# Patient Record
Sex: Male | Born: 1950
Health system: Southern US, Community
[De-identification: ages and names within clinical notes are randomized; demographics above are authoritative.]

## PROBLEM LIST (undated history)

## (undated) DIAGNOSIS — T7840XA Allergy, unspecified, initial encounter: Secondary | ICD-10-CM

## (undated) DIAGNOSIS — E785 Hyperlipidemia, unspecified: Secondary | ICD-10-CM

## (undated) DIAGNOSIS — I10 Essential (primary) hypertension: Secondary | ICD-10-CM

## (undated) DIAGNOSIS — Z5189 Encounter for other specified aftercare: Secondary | ICD-10-CM

## (undated) HISTORY — DX: Hyperlipidemia, unspecified: E78.5

## (undated) HISTORY — PX: HERNIA REPAIR: SHX51

## (undated) HISTORY — DX: Allergy, unspecified, initial encounter: T78.40XA

## (undated) HISTORY — PX: TONSILLECTOMY: SUR1361

## (undated) HISTORY — PX: COLONOSCOPY: SHX174

## (undated) HISTORY — DX: Essential (primary) hypertension: I10

## (undated) HISTORY — DX: Encounter for other specified aftercare: Z51.89

## (undated) HISTORY — PX: POLYPECTOMY: SHX149

---

## 2005-01-25 ENCOUNTER — Ambulatory Visit: Payer: Self-pay | Admitting: Family Medicine

## 2005-06-28 ENCOUNTER — Ambulatory Visit: Payer: Self-pay | Admitting: Internal Medicine

## 2006-07-02 ENCOUNTER — Ambulatory Visit: Payer: Self-pay | Admitting: Internal Medicine

## 2007-01-18 ENCOUNTER — Ambulatory Visit: Payer: Self-pay | Admitting: Internal Medicine

## 2007-01-18 LAB — CONVERTED CEMR LAB
ALT: 35 units/L (ref 0–40)
AST: 31 units/L (ref 0–37)
Albumin: 4.1 g/dL (ref 3.5–5.2)
Alkaline Phosphatase: 70 units/L (ref 39–117)
Bilirubin, Direct: 0.1 mg/dL (ref 0.0–0.3)
Cholesterol: 149 mg/dL (ref 0–200)
HDL: 35.9 mg/dL — ABNORMAL LOW (ref >39.0)
LDL Cholesterol: 99 mg/dL (ref 0–99)
Total Bilirubin: 1.2 mg/dL (ref 0.3–1.2)
Total CHOL/HDL Ratio: 4.2
Total Protein: 7.2 g/dL (ref 6.0–8.3)
Triglycerides: 73 mg/dL (ref 0–149)
VLDL: 15 mg/dL (ref 0–40)

## 2007-08-15 ENCOUNTER — Ambulatory Visit: Payer: Self-pay | Admitting: Internal Medicine

## 2007-08-15 DIAGNOSIS — D126 Benign neoplasm of colon, unspecified: Secondary | ICD-10-CM | POA: Insufficient documentation

## 2007-08-15 DIAGNOSIS — E785 Hyperlipidemia, unspecified: Secondary | ICD-10-CM

## 2007-08-15 DIAGNOSIS — E782 Mixed hyperlipidemia: Secondary | ICD-10-CM | POA: Insufficient documentation

## 2008-01-27 ENCOUNTER — Ambulatory Visit: Payer: Self-pay | Admitting: Internal Medicine

## 2008-02-12 ENCOUNTER — Encounter: Payer: Self-pay | Admitting: Internal Medicine

## 2008-02-12 ENCOUNTER — Ambulatory Visit: Payer: Self-pay | Admitting: Internal Medicine

## 2008-09-10 ENCOUNTER — Ambulatory Visit: Payer: Self-pay | Admitting: Internal Medicine

## 2008-09-14 ENCOUNTER — Encounter: Payer: Self-pay | Admitting: Internal Medicine

## 2008-09-25 ENCOUNTER — Telehealth (INDEPENDENT_AMBULATORY_CARE_PROVIDER_SITE_OTHER): Payer: Self-pay | Admitting: *Deleted

## 2008-12-29 ENCOUNTER — Telehealth (INDEPENDENT_AMBULATORY_CARE_PROVIDER_SITE_OTHER): Payer: Self-pay | Admitting: *Deleted

## 2009-01-21 ENCOUNTER — Ambulatory Visit: Payer: Self-pay | Admitting: Internal Medicine

## 2009-01-25 ENCOUNTER — Encounter (INDEPENDENT_AMBULATORY_CARE_PROVIDER_SITE_OTHER): Payer: Self-pay | Admitting: *Deleted

## 2009-01-25 LAB — CONVERTED CEMR LAB
ALT: 29 U/L
AST: 27 U/L
Cholesterol: 144 mg/dL
HDL: 33.7 mg/dL — ABNORMAL LOW
LDL Cholesterol: 98 mg/dL
Total CHOL/HDL Ratio: 4.3
Triglycerides: 60 mg/dL
VLDL: 12 mg/dL

## 2009-04-21 ENCOUNTER — Ambulatory Visit: Payer: Self-pay | Admitting: Internal Medicine

## 2009-04-26 LAB — CONVERTED CEMR LAB
ALT: 24 units/L (ref 0–53)
AST: 23 units/L (ref 0–37)
Cholesterol: 149 mg/dL (ref 0–200)
HDL: 31 mg/dL — ABNORMAL LOW (ref >39.00)
LDL Cholesterol: 94 mg/dL (ref 0–99)
Total CHOL/HDL Ratio: 5
Triglycerides: 118 mg/dL (ref 0.0–149.0)
VLDL: 23.6 mg/dL (ref 0.0–40.0)

## 2009-09-24 ENCOUNTER — Ambulatory Visit: Payer: Self-pay | Admitting: Internal Medicine

## 2010-02-22 ENCOUNTER — Telehealth (INDEPENDENT_AMBULATORY_CARE_PROVIDER_SITE_OTHER): Payer: Self-pay | Admitting: *Deleted

## 2010-04-25 ENCOUNTER — Telehealth (INDEPENDENT_AMBULATORY_CARE_PROVIDER_SITE_OTHER): Payer: Self-pay | Admitting: *Deleted

## 2010-04-25 DIAGNOSIS — R972 Elevated prostate specific antigen [PSA]: Secondary | ICD-10-CM

## 2010-05-04 ENCOUNTER — Ambulatory Visit: Payer: Self-pay | Admitting: Internal Medicine

## 2010-05-06 LAB — CONVERTED CEMR LAB: PSA: 0.86 ng/mL (ref 0.10–4.00)

## 2010-10-05 ENCOUNTER — Ambulatory Visit: Payer: Self-pay | Admitting: Internal Medicine

## 2010-10-05 DIAGNOSIS — L989 Disorder of the skin and subcutaneous tissue, unspecified: Secondary | ICD-10-CM | POA: Insufficient documentation

## 2010-10-07 LAB — CONVERTED CEMR LAB
ALT: 34 units/L (ref 0–53)
AST: 34 units/L (ref 0–37)
BUN: 15 mg/dL (ref 6–23)
Basophils Absolute: 0 10*3/uL (ref 0.0–0.1)
Basophils Relative: 0.6 % (ref 0.0–3.0)
CO2: 29 meq/L (ref 19–32)
Calcium: 9.5 mg/dL (ref 8.4–10.5)
Chloride: 105 meq/L (ref 96–112)
Cholesterol: 173 mg/dL (ref 0–200)
Creatinine, Ser: 1 mg/dL (ref 0.4–1.5)
Eosinophils Absolute: 0.3 10*3/uL (ref 0.0–0.7)
Eosinophils Relative: 6.5 % — ABNORMAL HIGH (ref 0.0–5.0)
GFR calc non Af Amer: 78.08 mL/min (ref >60)
Glucose, Bld: 99 mg/dL (ref 70–99)
HCT: 43.5 % (ref 39.0–52.0)
HDL: 39.2 mg/dL (ref >39.00)
Hemoglobin: 15.1 g/dL (ref 13.0–17.0)
LDL Cholesterol: 114 mg/dL — ABNORMAL HIGH (ref 0–99)
Lymphocytes Relative: 28.5 % (ref 12.0–46.0)
Lymphs Abs: 1.4 10*3/uL (ref 0.7–4.0)
MCHC: 34.8 g/dL (ref 30.0–36.0)
MCV: 95.5 fL (ref 78.0–100.0)
Monocytes Absolute: 0.6 10*3/uL (ref 0.1–1.0)
Monocytes Relative: 12.8 % — ABNORMAL HIGH (ref 3.0–12.0)
Neutro Abs: 2.5 10*3/uL (ref 1.4–7.7)
Neutrophils Relative %: 51.6 % (ref 43.0–77.0)
PSA: 0.94 ng/mL (ref 0.10–4.00)
Platelets: 226 10*3/uL (ref 150.0–400.0)
Potassium: 4.5 meq/L (ref 3.5–5.1)
RBC: 4.55 M/uL (ref 4.22–5.81)
RDW: 12.5 % (ref 11.5–14.6)
Sodium: 141 meq/L (ref 135–145)
TSH: 1.38 microintl units/mL (ref 0.35–5.50)
Total CHOL/HDL Ratio: 4
Triglycerides: 100 mg/dL (ref 0.0–149.0)
VLDL: 20 mg/dL (ref 0.0–40.0)
WBC: 4.8 10*3/uL (ref 4.5–10.5)

## 2011-01-11 ENCOUNTER — Other Ambulatory Visit: Payer: Self-pay | Admitting: Internal Medicine

## 2011-01-11 ENCOUNTER — Ambulatory Visit
Admission: RE | Admit: 2011-01-11 | Discharge: 2011-01-11 | Payer: Self-pay | Source: Home / Self Care | Attending: Internal Medicine | Admitting: Internal Medicine

## 2011-01-11 LAB — LIPID PANEL
Cholesterol: 161 mg/dL (ref 0–200)
HDL: 36.3 mg/dL — ABNORMAL LOW (ref >39.00)
LDL Cholesterol: 108 mg/dL — ABNORMAL HIGH (ref 0–99)
Total CHOL/HDL Ratio: 4
Triglycerides: 86 mg/dL (ref 0.0–149.0)
VLDL: 17.2 mg/dL (ref 0.0–40.0)

## 2011-01-22 LAB — CONVERTED CEMR LAB
ALT: 22 units/L (ref 0–53)
ALT: 24 units/L (ref 0–53)
ALT: 27 units/L (ref 0–53)
AST: 25 units/L (ref 0–37)
AST: 26 units/L (ref 0–37)
AST: 29 units/L (ref 0–37)
Albumin: 4.5 g/dL (ref 3.5–5.2)
Alkaline Phosphatase: 68 units/L (ref 39–117)
BUN: 12 mg/dL (ref 6–23)
BUN: 13 mg/dL (ref 6–23)
BUN: 14 mg/dL (ref 6–23)
Basophils Absolute: 0 10*3/uL (ref 0.0–0.1)
Basophils Absolute: 0 10*3/uL (ref 0.0–0.1)
Basophils Relative: 0.4 % (ref 0.0–3.0)
Basophils Relative: 0.6 % (ref 0.0–3.0)
Bilirubin, Direct: 0.2 mg/dL (ref 0.0–0.3)
CO2: 30 meq/L (ref 19–32)
CO2: 31 meq/L (ref 19–32)
CO2: 32 meq/L (ref 19–32)
Calcium: 9.3 mg/dL (ref 8.4–10.5)
Calcium: 9.4 mg/dL (ref 8.4–10.5)
Calcium: 9.7 mg/dL (ref 8.4–10.5)
Chloride: 102 meq/L (ref 96–112)
Chloride: 104 meq/L (ref 96–112)
Chloride: 107 meq/L (ref 96–112)
Cholesterol: 141 mg/dL (ref 0–200)
Cholesterol: 179 mg/dL (ref 0–200)
Creatinine, Ser: 1 mg/dL (ref 0.4–1.5)
Creatinine, Ser: 1.1 mg/dL (ref 0.4–1.5)
Creatinine, Ser: 1.1 mg/dL (ref 0.4–1.5)
Eosinophils Absolute: 0.3 10*3/uL (ref 0.0–0.7)
Eosinophils Absolute: 0.4 10*3/uL (ref 0.0–0.7)
Eosinophils Relative: 6.1 % — ABNORMAL HIGH (ref 0.0–5.0)
Eosinophils Relative: 7.8 % — ABNORMAL HIGH (ref 0.0–5.0)
GFR calc Af Amer: 89 mL/min
GFR calc Af Amer: 90 mL/min
GFR calc non Af Amer: 74 mL/min
GFR calc non Af Amer: 74 mL/min
GFR calc non Af Amer: 82 mL/min (ref >60)
Glucose, Bld: 100 mg/dL — ABNORMAL HIGH (ref 70–99)
Glucose, Bld: 88 mg/dL (ref 70–99)
Glucose, Bld: 93 mg/dL (ref 70–99)
HCT: 43 % (ref 39.0–52.0)
HCT: 44.9 % (ref 39.0–52.0)
HDL: 33.5 mg/dL — ABNORMAL LOW (ref >39.00)
HDL: 36 mg/dL — ABNORMAL LOW (ref >39.0)
Hemoglobin: 14.9 g/dL (ref 13.0–17.0)
Hemoglobin: 15.2 g/dL (ref 13.0–17.0)
Hemoglobin: 15.7 g/dL (ref 13.0–17.0)
LDL Cholesterol: 128 mg/dL — ABNORMAL HIGH (ref 0–99)
LDL Cholesterol: 94 mg/dL (ref 0–99)
Lymphocytes Relative: 29.4 % (ref 12.0–46.0)
Lymphocytes Relative: 32.7 % (ref 12.0–46.0)
Lymphs Abs: 1.4 10*3/uL (ref 0.7–4.0)
MCHC: 34.5 g/dL (ref 30.0–36.0)
MCHC: 34.9 g/dL (ref 30.0–36.0)
MCV: 93.2 fL (ref 78.0–100.0)
MCV: 96.4 fL (ref 78.0–100.0)
Monocytes Absolute: 0.6 10*3/uL (ref 0.1–1.0)
Monocytes Absolute: 0.7 10*3/uL (ref 0.1–1.0)
Monocytes Relative: 13.9 % — ABNORMAL HIGH (ref 3.0–12.0)
Monocytes Relative: 14.7 % — ABNORMAL HIGH (ref 3.0–12.0)
Neutro Abs: 1.9 10*3/uL (ref 1.4–7.7)
Neutro Abs: 2.3 10*3/uL (ref 1.4–7.7)
Neutrophils Relative %: 46.1 % (ref 43.0–77.0)
Neutrophils Relative %: 48.3 % (ref 43.0–77.0)
PSA: 0.67 ng/mL (ref 0.10–4.00)
PSA: 0.83 ng/mL (ref 0.10–4.00)
PSA: 2.1 ng/mL (ref 0.10–4.00)
Platelets: 247 10*3/uL (ref 150–400)
Platelets: 263 10*3/uL (ref 150.0–400.0)
Potassium: 3.9 meq/L (ref 3.5–5.1)
Potassium: 4.4 meq/L (ref 3.5–5.1)
Potassium: 4.7 meq/L (ref 3.5–5.1)
RBC: 4.62 M/uL (ref 4.22–5.81)
RBC: 4.66 M/uL (ref 4.22–5.81)
RDW: 11.3 % — ABNORMAL LOW (ref 11.5–14.6)
RDW: 11.5 % (ref 11.5–14.6)
Sodium: 138 meq/L (ref 135–145)
Sodium: 140 meq/L (ref 135–145)
Sodium: 141 meq/L (ref 135–145)
TSH: 1.65 microintl units/mL (ref 0.35–5.50)
Total Bilirubin: 1 mg/dL (ref 0.3–1.2)
Total CHOL/HDL Ratio: 4
Total CHOL/HDL Ratio: 5
Total Protein: 7.4 g/dL (ref 6.0–8.3)
Triglycerides: 69 mg/dL (ref 0.0–149.0)
Triglycerides: 77 mg/dL (ref 0–149)
VLDL: 13.8 mg/dL (ref 0.0–40.0)
VLDL: 15 mg/dL (ref 0–40)
WBC: 4.2 10*3/uL — ABNORMAL LOW (ref 4.5–10.5)
WBC: 4.8 10*3/uL (ref 4.5–10.5)

## 2011-01-24 NOTE — Assessment & Plan Note (Signed)
Summary: CPX--PH   Vital Signs:  Patient profile:   60 year old male Height:      75 inches Weight:      203.50 pounds BMI:     25.53 Pulse rate:   62 / minute Pulse rhythm:   regular BP sitting:   122 / 82  (left arm) Cuff size:   large  Vitals Entered By: Army Fossa CMA (October 05, 2010 8:56 AM) CC: CPX, fasting Comments declines flu shot  [facility]    History of Present Illness: CPX   Preventive Screening-Counseling & Management  Caffeine-Diet-Exercise     Times/week: 3  Current Medications (verified): 1)  Bayer Low Strength 81 Mg  Tbec (Aspirin) 2)  Zocor 40 Mg Tabs (Simvastatin) .Marland Kitchen.. 1 By Mouth At Bedtime  Allergies (verified): No Known Drug Allergies  Past History:  Past Medical History: Reviewed history from 08/15/2007 and no changes required. Hyperlipidemia  Past Surgical History: Reviewed history from 09/10/2008 and no changes required. Denies surgical history  Family History: Reviewed history from 09/24/2009 and no changes required. father had a heart attack at age 41. 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 CV:  Denies chest pain or discomfort and swelling of feet. Resp:  Denies cough, shortness of breath, and wheezing. GI:  Denies bloody stools, constipation, diarrhea, and nausea. GU:  Denies dysuria, hematuria, nocturia, and urinary frequency. Psych:  Denies anxiety and depression.  Physical Exam  General:  alert, well-developed, and well-nourished.   Neck:  no masses, no thyromegaly, and normal carotid upstroke.   Lungs:  normal respiratory effort, no intercostal retractions, no accessory muscle use, and  normal breath sounds.   Heart:  normal rate, regular rhythm, and no murmur.   Abdomen:  soft, non-tender, normal bowel sounds, no distention, no masses, no guarding, and no rigidity.  no bruit  Rectal:  small external hemorrhoids, Normal sphincter tone. No rectal masses or tenderness. Prostate:  Prostate gland firm and smooth, no enlargement, nodularity, tenderness, mass, asymmetry or induration. Extremities:  no pretibial edema bilaterally  Psych:  Cognition and judgment appear intact. Alert and cooperative with normal attention span and concentration.  not anxious appearing and not depressed appearing.     Impression & Recommendations:  Problem # 1:  HEALTH MAINTENANCE EXAM (ICD-V70.0) tetanus shot 2003 flu shot, declined , explained benefits   had a colonoscopy in July 2004 -- biopsy showed tubular adenomas and hyperplastic polyps. colonoscopy 01/2008 colon two polyps, tics, hemorrhoids.  Dr. Marina Goodell.  Next,  in 5 years   doing great, diet info provided   Orders: Venipuncture ([address]) TLB-BMP (Basic Metabolic Panel-BMET) (80048-METABOL) TLB-CBC Platelet - w/Differential (85025-CBCD) TLB-Lipid Panel (80061-LIPID) TLB-TSH (Thyroid Stimulating Hormone) (84443-TSH) TLB-PSA (Prostate Specific Antigen) (84153-PSA) TLB-ALT (SGPT) (84460-ALT) TLB-AST (SGOT) (84450-SGOT) Specimen Handling (60454)  Problem # 2:  HYPERLIPIDEMIA (ICD-272.4) no change  His updated medication list for this problem includes:    Zocor 40 Mg Tabs (Simvastatin) .Marland Kitchen... 1 by mouth at bedtime  Labs Reviewed: SGOT: 25 (09/24/2009)   SGPT: 27 (09/24/2009)   HDL:33.50 (09/24/2009), 31.00 (04/21/2009)  LDL:94 (09/24/2009), 94 (04/21/2009)  Chol:141 (09/24/2009), 149 (04/21/2009)  Trig:69.0 (09/24/2009), 118.0 (04/21/2009)  Problem # 3:  SKIN LESIONS, MULTIPLE (  ICD-709.9)  patient's wife has noted  skin lesions in the scalp Exam is confirmatory, most of them are papular slightly hyperchromic. lesions are likely  benign but patient is concerned. plan: dermatology referral  Orders: Dermatology Referral (Derma)  Complete Medication List: 1)  Bayer Low Strength 81 Mg Tbec (Aspirin) 2)  Zocor 40 Mg Tabs (Simvastatin) .Marland Kitchen.. 1 by mouth at bedtime  Patient Instructions: 1)  Please schedule a follow-up appointment in 1 year, fastin, physical exam    Risk Factors:  Exercise:  yes    Times per week:  3

## 2011-01-24 NOTE — Progress Notes (Signed)
Summary: refill request  Phone Note Refill Request Message from:  Fax from Pharmacy on February 22, 2010 3:19 PM  Refills Requested: Medication #1:  ZOCOR 40 MG TABS 1 by mouth at bedtime.   Brand Name Necessary? No   Supply Requested: 3 months refill authorization to [facility] on [address]  Initial call taken by: Michaelle Copas,  February 22, 2010 3:19 PM    Prescriptions: ZOCOR 40 MG TABS (SIMVASTATIN) 1 by mouth at bedtime  #90 Tablet x 0   Entered by:   Kandice Hams   Authorized by:   Nolon Rod. Paz MD   Signed by:   Kandice Hams on 02/22/2010   Method used:   Faxed to ...       [facility] [id number]* (retail)       [address]       [place], [place]  [address]       Ph: [phone]       Fax: [phone]   RxID:   [id number]

## 2011-01-24 NOTE — Progress Notes (Signed)
Summary: due lab   Phone Note Outgoing Call Call back at Home Phone [phone] Call back at Work Phone [phone]   Summary of Call: Patient is due for his repeat PSA dx 790.93 Shary Decamp  Apr 25, 2010 9:03 AM   Follow-up for Phone Call        [email or web address] Barban  Apr 25, 2010 10:06 AM  Additional Follow-up for Phone Call Additional follow up Details #1::        Patient is coming in on 5.11.11 Additional Follow-up by: Harold Barban,  Apr 27, 2010 1:25 PM  New Problems: PSA, INCREASED (ICD-790.93)   New Problems: PSA, INCREASED (ICD-790.93)

## 2011-05-28 ENCOUNTER — Other Ambulatory Visit: Payer: Self-pay | Admitting: Internal Medicine

## 2012-02-23 ENCOUNTER — Other Ambulatory Visit: Payer: Self-pay | Admitting: Internal Medicine

## 2012-02-23 NOTE — Telephone Encounter (Signed)
Prescription sent to pharmacy.  Needs appointment

## 2012-02-27 ENCOUNTER — Other Ambulatory Visit: Payer: Self-pay | Admitting: Internal Medicine

## 2012-06-03 ENCOUNTER — Other Ambulatory Visit: Payer: Self-pay | Admitting: Internal Medicine

## 2012-06-04 MED ORDER — SIMVASTATIN 40 MG PO TABS: 40.0000 mg | ORAL_TABLET | Freq: Every day | ORAL | Status: DC

## 2012-06-04 NOTE — Telephone Encounter (Signed)
Refilled meds

## 2012-06-04 NOTE — Telephone Encounter (Signed)
Pt has CPE scheduled for 07/05/12 and would like his meds refilled.

## 2012-06-04 NOTE — Telephone Encounter (Signed)
Addended by: Edwena Felty T on: 06/04/2012 01:19 PM   Modules accepted: Orders

## 2012-06-04 NOTE — Telephone Encounter (Signed)
Pt must have an appt for future refills.

## 2012-07-05 ENCOUNTER — Ambulatory Visit (INDEPENDENT_AMBULATORY_CARE_PROVIDER_SITE_OTHER): Payer: BC Managed Care – PPO | Admitting: Internal Medicine

## 2012-07-05 ENCOUNTER — Encounter: Payer: Self-pay | Admitting: Internal Medicine

## 2012-07-05 VITALS — BP 140/88 | HR 49 | Temp 97.4°F | Ht 74.5 in | Wt 204.0 lb

## 2012-07-05 DIAGNOSIS — Z0000 Encounter for general adult medical examination without abnormal findings: Secondary | ICD-10-CM | POA: Insufficient documentation

## 2012-07-05 DIAGNOSIS — E785 Hyperlipidemia, unspecified: Secondary | ICD-10-CM

## 2012-07-05 DIAGNOSIS — L989 Disorder of the skin and subcutaneous tissue, unspecified: Secondary | ICD-10-CM

## 2012-07-05 LAB — CBC WITH DIFFERENTIAL/PLATELET
Basophils Absolute: 0 10*3/uL (ref 0.0–0.1)
HCT: 43.3 % (ref 39.0–52.0)
Hemoglobin: 14.6 g/dL (ref 13.0–17.0)
Lymphs Abs: 1.5 10*3/uL (ref 0.7–4.0)
MCHC: 33.8 g/dL (ref 30.0–36.0)
MCV: 96 fl (ref 78.0–100.0)
Monocytes Absolute: 0.6 10*3/uL (ref 0.1–1.0)
Monocytes Relative: 13.6 % — ABNORMAL HIGH (ref 3.0–12.0)
Neutro Abs: 2.1 10*3/uL (ref 1.4–7.7)
Platelets: 231 10*3/uL (ref 150.0–400.0)
RDW: 12.4 % (ref 11.5–14.6)

## 2012-07-05 LAB — COMPREHENSIVE METABOLIC PANEL
ALT: 23 U/L (ref 0–53)
AST: 25 U/L (ref 0–37)
Alkaline Phosphatase: 66 U/L (ref 39–117)
CO2: 26 mEq/L (ref 19–32)
Creatinine, Ser: 1 mg/dL (ref 0.4–1.5)
Sodium: 139 mEq/L (ref 135–145)
Total Bilirubin: 0.9 mg/dL (ref 0.3–1.2)
Total Protein: 7.4 g/dL (ref 6.0–8.3)

## 2012-07-05 LAB — LIPID PANEL
Cholesterol: 162 mg/dL (ref 0–200)
VLDL: 15.8 mg/dL (ref 0.0–40.0)

## 2012-07-05 LAB — TSH: TSH: 1.16 u[IU]/mL (ref 0.35–5.50)

## 2012-07-05 LAB — PSA: PSA: 1.18 ng/mL (ref 0.10–4.00)

## 2012-07-05 NOTE — Assessment & Plan Note (Signed)
Tdap today  had a colonoscopy in July 2004 -- biopsy showed tubular adenomas and hyperplastic polyps. colonoscopy 01/2008 colon two polyps, tics, hemorrhoids.  Dr. Marina Goodell.  Next,  in 5 years  Diet and exercise discussed, labs

## 2012-07-05 NOTE — Progress Notes (Signed)
  Subjective:    Patient ID: Christopher Santos, male    DOB: 14-Oct-1951, 61 y.o.   MRN: [id number]  HPI CXR  Past Medical History: Hyperlipidemia  Past Surgical History: Denies surgical history  Family History: father had a heart attack at age 68. 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 No anxiety- depression. No dysuria gross hematuria. Good compliance with   cholesterol medication.    Objective:   Physical Exam General -- alert, well-developed, and well-nourished.   Neck --no thyromegaly , normal carotid pulse Lungs -- normal respiratory effort, no intercostal retractions, no accessory muscle use, and normal breath sounds.   Heart-- normal rate, regular rhythm, no murmur, and no gallop.   Abdomen--soft, non-tender, no distention, no masses, no HSM, no guarding, and no rigidity.   Extremities-- no pretibial edema bilaterally Rectal-- No external abnormalities noted. Normal sphincter tone. No rectal masses or tenderness. Brown stool  Prostate:  Prostate gland firm and smooth, no enlargement, nodularity, tenderness, mass, asymmetry or induration. Neurologic-- alert & oriented X3 and strength normal in all extremities. Psych-- Cognition and judgment appear intact. Alert and cooperative with normal attention span and concentration.  not anxious appearing and not depressed appearing.      Assessment & Plan:

## 2012-07-05 NOTE — Patient Instructions (Signed)
Your BP today is 140/88, slightly elevated. Check the  blood pressure 2 or 3 times a week, be sure it is between 110/60 and 140/85. If it is consistently higher or lower, let me know

## 2012-07-05 NOTE — Assessment & Plan Note (Signed)
Was evaluated by dermatology, told lesions were okay, lesions still there, the patient's wife is concerned. Recommend a re- referral to dermatology if needed.

## 2012-07-05 NOTE — Assessment & Plan Note (Signed)
Good compliance with meds, labs 

## 2012-09-02 ENCOUNTER — Other Ambulatory Visit: Payer: Self-pay | Admitting: Internal Medicine

## 2012-09-03 NOTE — Telephone Encounter (Signed)
Refill done.  

## 2013-02-08 ENCOUNTER — Other Ambulatory Visit: Payer: Self-pay

## 2013-03-12 ENCOUNTER — Encounter: Payer: Self-pay | Admitting: Internal Medicine

## 2013-05-26 ENCOUNTER — Other Ambulatory Visit: Payer: Self-pay | Admitting: Internal Medicine

## 2013-05-27 ENCOUNTER — Encounter: Payer: Self-pay | Admitting: *Deleted

## 2013-05-27 NOTE — Telephone Encounter (Signed)
Refill done.  Letter mailed to pt to make aware he is due for an OV.  

## 2013-07-24 ENCOUNTER — Encounter: Payer: Self-pay | Admitting: Internal Medicine

## 2013-07-24 ENCOUNTER — Ambulatory Visit (INDEPENDENT_AMBULATORY_CARE_PROVIDER_SITE_OTHER): Payer: BC Managed Care – PPO | Admitting: Internal Medicine

## 2013-07-24 VITALS — BP 130/70 | HR 56 | Temp 97.5°F | Ht 74.75 in | Wt 207.0 lb

## 2013-07-24 DIAGNOSIS — Z23 Encounter for immunization: Secondary | ICD-10-CM

## 2013-07-24 DIAGNOSIS — Z0000 Encounter for general adult medical examination without abnormal findings: Secondary | ICD-10-CM

## 2013-07-24 DIAGNOSIS — E785 Hyperlipidemia, unspecified: Secondary | ICD-10-CM

## 2013-07-24 DIAGNOSIS — Z2911 Encounter for prophylactic immunotherapy for respiratory syncytial virus (RSV): Secondary | ICD-10-CM

## 2013-07-24 LAB — COMPREHENSIVE METABOLIC PANEL
AST: 25 U/L (ref 0–37)
Alkaline Phosphatase: 67 U/L (ref 39–117)
BUN: 11 mg/dL (ref 6–23)
Calcium: 9.4 mg/dL (ref 8.4–10.5)
Creatinine, Ser: 1 mg/dL (ref 0.4–1.5)
Total Bilirubin: 0.7 mg/dL (ref 0.3–1.2)

## 2013-07-24 LAB — LIPID PANEL
HDL: 35 mg/dL — ABNORMAL LOW (ref >39.00)
Total CHOL/HDL Ratio: 4
Triglycerides: 59 mg/dL (ref 0.0–149.0)
VLDL: 11.8 mg/dL (ref 0.0–40.0)

## 2013-07-24 NOTE — Progress Notes (Signed)
  Subjective:    Patient ID: Christopher Santos, male    DOB: 07/03/1951, 62 y.o.   MRN: [id number]  HPI CPX   Past Medical History  Diagnosis Date  . Hyperlipemia    Past Surgical History  Procedure Laterality Date  . No past surgeries     History   Social History  . Marital Status: Married    Spouse Name: N/A    Number of Children: 0  . Years of Education: N/A   Occupational History  .  [employer]   Social History Main Topics  . Smoking status: Former Games developer  . Smokeless tobacco: Never Used     Comment: quit 1993, no heavy use   . Alcohol Use: Yes     Comment: socially   . Drug Use: No  . Sexually Active: Not on file   Other Topics Concern  . 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 CAD Father     MI at 76  . Diabetes Neg Hx   . Colon cancer Neg Hx   . Prostate cancer Neg Hx     Review of Systems Diet--healthy-regular Exercise--swimming 3 times a week Denies chest pain, shortness or breath No nausea, vomiting, diarrhea or blood in the stools. No dysuria or gross hematuria. Some stress at work but not actual anxiety or depression.     Objective:   Physical Exam  BP 130/70  Pulse 56  Temp(Src) 97.5 F (36.4 C) (Oral)  Ht 6' 2.75" (1.899 m)  Wt 207 lb (93.895 kg)  BMI 26.04 kg/m2  SpO2 97% General -- alert, well-developed, NAD .   Neck --no thyromegaly , normal carotid pulse Lungs -- normal respiratory effort, no intercostal retractions, no accessory muscle use, and normal breath sounds.   Heart-- normal rate, regular rhythm, no murmur, and no gallop.   Abdomen--soft, non-tender, no distention, no masses, no HSM, no guarding, and no rigidity.   Extremities-- no pretibial edema bilaterally Rectal-- No external abnormalities noted. Normal sphincter tone. No rectal masses or tenderness. no stool found Prostate:  Prostate  gland firm and smooth, no enlargement, nodularity, tenderness, mass, asymmetry or induration. Neurologic-- alert & oriented X3 and strength normal in all extremities. Psych-- Cognition and judgment appear intact. Alert and cooperative with normal attention span and concentration.  not anxious appearing and not depressed appearing.         Assessment & Plan:

## 2013-07-24 NOTE — Assessment & Plan Note (Signed)
+   FH CAD, last LDL ~ 100 which is great. Cont meds ,ASA, labs    

## 2013-07-24 NOTE — Assessment & Plan Note (Signed)
Tdap 2013 zostavax -- pros-cons-benefits discussed, shot provided today had a colonoscopy in July 2004 -- biopsy showed tubular adenomas and hyperplastic polyps. colonoscopy 01/2008 colon two polyps, tics, hemorrhoids.  Dr. Marina Goodell.  Next,  in 5 years. Letter from GI reprinted, encouraged to call  Diet and exercise discussed, labs

## 2013-07-29 ENCOUNTER — Encounter: Payer: Self-pay | Admitting: Internal Medicine

## 2013-07-29 ENCOUNTER — Other Ambulatory Visit: Payer: Self-pay | Admitting: Internal Medicine

## 2013-07-30 NOTE — Telephone Encounter (Signed)
Refill done per protocol.  

## 2013-09-25 ENCOUNTER — Ambulatory Visit (AMBULATORY_SURGERY_CENTER): Payer: Self-pay | Admitting: *Deleted

## 2013-09-25 VITALS — Ht 75.0 in | Wt 211.4 lb

## 2013-09-25 DIAGNOSIS — Z86010 Personal history of colonic polyps: Secondary | ICD-10-CM

## 2013-09-25 MED ORDER — MOVIPREP 100 G PO SOLR: ORAL | Status: DC

## 2013-09-25 NOTE — Progress Notes (Signed)
No allergies to eggs or soy. No previous anesthesia.

## 2013-09-26 ENCOUNTER — Encounter: Payer: Self-pay | Admitting: Internal Medicine

## 2013-10-09 ENCOUNTER — Encounter: Payer: Self-pay | Admitting: Internal Medicine

## 2013-10-09 ENCOUNTER — Ambulatory Visit (AMBULATORY_SURGERY_CENTER): Payer: BC Managed Care – PPO | Admitting: Internal Medicine

## 2013-10-09 VITALS — BP 130/55 | HR 61 | Temp 98.7°F | Resp 20 | Ht 75.0 in | Wt 211.0 lb

## 2013-10-09 DIAGNOSIS — D126 Benign neoplasm of colon, unspecified: Secondary | ICD-10-CM

## 2013-10-09 DIAGNOSIS — Z86010 Personal history of colonic polyps: Secondary | ICD-10-CM

## 2013-10-09 MED ORDER — SODIUM CHLORIDE 0.9 % IV SOLN: 500.0000 mL | INTRAVENOUS | Status: DC

## 2013-10-09 NOTE — Progress Notes (Signed)
Called to room to assist during endoscopic procedure.  Patient ID and intended procedure confirmed with present staff. Received instructions for my participation in the procedure from the performing physician.  

## 2013-10-09 NOTE — Progress Notes (Signed)
Lidocaine-40mg IV prior to Propofol InductionPropofol given over incremental dosages 

## 2013-10-09 NOTE — Op Note (Signed)
[facility] [address] [place], [address]   COLONOSCOPY PROCEDURE REPORT  PATIENT: Christopher Santos, Christopher Santos  MR#: [id number] BIRTHDATE: [birth year] , 62  yrs. old GENDER: Male ENDOSCOPIST: Roxy Cedar, MD REFERRED XB:[id number] Program Recall PROCEDURE DATE:  10/09/2013 PROCEDURE:   Colonoscopy with snare polypectomy x 1 First Screening Colonoscopy - Avg.  risk and is 50 yrs.  old or older - No.  Prior Negative Screening - Now for repeat screening. N/A  History of Adenoma - Now for follow-up colonoscopy & has been > or = to 3 yrs.  Yes hx of adenoma.  Has been 3 or more years since last colonoscopy.  Polyps Removed Today? Yes. ASA CLASS:   Class II INDICATIONS:Patient's personal history of adenomatous colon polyps. Index 2004 (multiple adenomas); 2009 (TAs) MEDICATIONS: MAC sedation, administered by CRNA and propofol (Diprivan) 250mg  IV  DESCRIPTION OF PROCEDURE:   After the risks benefits and alternatives of the procedure were thoroughly explained, informed consent was obtained.  A digital rectal exam revealed no abnormalities of the rectum.   The LB [id number]  endoscope was introduced through the anus and advanced to the cecum, which was identified by both the appendix and ileocecal valve. No adverse events experienced.   The quality of the prep was excellent, using MoviPrep  The instrument was then slowly withdrawn as the colon was fully examined.  COLON FINDINGS: A sessile polyp measuring 7 mm in size was found in the ascending colon.  A polypectomy was performed with a cold snare.  The resection was complete and the polyp tissue was completely retrieved.   Mild diverticulosis was noted in the sigmoid colon.   The colon mucosa was otherwise normal. Retroflexed views revealed internal hemorrhoids. The time to cecum=2 minutes 40 seconds.  Withdrawal time=14 minutes 40 seconds. The scope was withdrawn and the procedure completed. COMPLICATIONS:  There were no complications.  ENDOSCOPIC IMPRESSION: 1.   Sessile polyp measuring 7 mm in size was found in the ascending colon; polypectomy was performed with a cold snare 2.   Mild diverticulosis was noted in the sigmoid colon 3.   The colon mucosa was otherwise normal  RECOMMENDATIONS: 1. Follow up colonoscopy in 5 years   eSigned:  Roxy Cedar, MD 10/09/2013 10:43 AM   cc: Willow Ora, MD and The Patient

## 2013-10-09 NOTE — Progress Notes (Signed)
Patient did not experience any of the following events: a burn prior to discharge; a fall within the facility; wrong site/side/patient/procedure/implant event; or a hospital transfer or hospital admission upon discharge from the facility. (G8907) Patient did not have preoperative order for IV antibiotic SSI prophylaxis. (G8918)  

## 2013-10-09 NOTE — Patient Instructions (Signed)
YOU HAD AN ENDOSCOPIC PROCEDURE TODAY AT THE [facility]: Refer to the procedure report that was given to you for any specific questions about what was found during the examination.  If the procedure report does not answer your questions, please call your gastroenterologist to clarify.  If you requested that your care partner not be given the details of your procedure findings, then the procedure report has been included in a sealed envelope for you to review at your convenience later.  YOU SHOULD EXPECT: Some feelings of bloating in the abdomen. Passage of more gas than usual.  Walking can help get rid of the air that was put into your GI tract during the procedure and reduce the bloating. If you had a lower endoscopy (such as a colonoscopy or flexible sigmoidoscopy) you may notice spotting of blood in your stool or on the toilet paper. If you underwent a bowel prep for your procedure, then you may not have a normal bowel movement for a few days.  DIET: Your first meal following the procedure should be a light meal and then it is ok to progress to your normal diet.  A half-sandwich or bowl of soup is an example of a good first meal.  Heavy or fried foods are harder to digest and may make you feel nauseous or bloated.  Likewise meals heavy in dairy and vegetables can cause extra gas to form and this can also increase the bloating.  Drink plenty of fluids but you should avoid alcoholic beverages for 24 hours.  ACTIVITY: Your care partner should take you home directly after the procedure.  You should plan to take it easy, moving slowly for the rest of the day.  You can resume normal activity the day after the procedure however you should NOT DRIVE or use heavy machinery for 24 hours (because of the sedation medicines used during the test).    SYMPTOMS TO REPORT IMMEDIATELY: A gastroenterologist can be reached at any hour.  During normal business hours, 8:30 AM to 5:00 PM Monday through Friday,  call [phone].  After hours and on weekends, please call the GI answering service at [phone] who will take a message and have the physician on call contact you.   Following lower endoscopy (colonoscopy or flexible sigmoidoscopy):  Excessive amounts of blood in the stool  Significant tenderness or worsening of abdominal pains  Swelling of the abdomen that is new, acute  Fever of 100F or higher  FOLLOW UP: If any biopsies were taken you will be contacted by phone or by letter within the next 1-3 weeks.  Call your gastroenterologist if you have not heard about the biopsies in 3 weeks.  Our staff will call the home number listed on your records the next business day following your procedure to check on you and address any questions or concerns that you may have at that time regarding the information given to you following your procedure. This is a courtesy call and so if there is no answer at the home number and we have not heard from you through the emergency physician on call, we will assume that you have returned to your regular daily activities without incident.  SIGNATURES/CONFIDENTIALITY: You and/or your care partner have signed paperwork which will be entered into your electronic medical record.  These signatures attest to the fact that that the information above on your After Visit Summary has been reviewed and is understood.  Full responsibility of the confidentiality of this   discharge information lies with you and/or your care-partner.  Polyps, diverticulosis, high fiber diet-handouts given  Repeat colonoscopy in 5 years.   

## 2013-10-10 ENCOUNTER — Telehealth: Payer: Self-pay

## 2013-10-10 NOTE — Telephone Encounter (Signed)
  Follow up Call-  Call back number 10/09/2013  Post procedure Call Back phone  # [phone]  Permission to leave phone message Yes     Patient questions:  Do you have a fever, pain , or abdominal swelling? no Pain Score  0 *  Have you tolerated food without any problems? yes  Have you been able to return to your normal activities? yes  Do you have any questions about your discharge instructions: Diet   no Medications  no Follow up visit  no  Do you have questions or concerns about your Care? no  Actions: * If pain score is 4 or above: No action needed, pain <4.   No problems per the pt. Maw

## 2013-10-14 ENCOUNTER — Encounter: Payer: Self-pay | Admitting: Internal Medicine

## 2013-10-30 ENCOUNTER — Other Ambulatory Visit: Payer: Self-pay

## 2014-01-10 ENCOUNTER — Other Ambulatory Visit: Payer: Self-pay | Admitting: Internal Medicine

## 2014-01-12 NOTE — Telephone Encounter (Signed)
Simvastatin refilled per protocol. JG//CMA

## 2014-07-14 ENCOUNTER — Other Ambulatory Visit: Payer: Self-pay | Admitting: Internal Medicine

## 2014-10-02 ENCOUNTER — Encounter: Payer: Self-pay | Admitting: Internal Medicine

## 2014-10-02 ENCOUNTER — Ambulatory Visit (INDEPENDENT_AMBULATORY_CARE_PROVIDER_SITE_OTHER): Payer: BC Managed Care – PPO | Admitting: Internal Medicine

## 2014-10-02 VITALS — BP 123/77 | HR 72 | Temp 97.5°F | Ht 75.0 in | Wt 203.1 lb

## 2014-10-02 DIAGNOSIS — Z0000 Encounter for general adult medical examination without abnormal findings: Secondary | ICD-10-CM

## 2014-10-02 DIAGNOSIS — E785 Hyperlipidemia, unspecified: Secondary | ICD-10-CM

## 2014-10-02 LAB — CBC WITH DIFFERENTIAL/PLATELET
Basophils Absolute: 0 10*3/uL (ref 0.0–0.1)
Basophils Relative: 1 % (ref 0–1)
Eosinophils Absolute: 0.2 10*3/uL (ref 0.0–0.7)
Eosinophils Relative: 4 % (ref 0–5)
HCT: 40.3 % (ref 39.0–52.0)
Hemoglobin: 14.1 g/dL (ref 13.0–17.0)
LYMPHS ABS: 1.3 10*3/uL (ref 0.7–4.0)
LYMPHS PCT: 27 % (ref 12–46)
MCH: 32.9 pg (ref 26.0–34.0)
MCHC: 35 g/dL (ref 30.0–36.0)
MCV: 93.9 fL (ref 78.0–100.0)
MONO ABS: 0.6 10*3/uL (ref 0.1–1.0)
Monocytes Relative: 13 % — ABNORMAL HIGH (ref 3–12)
NEUTROS ABS: 2.6 10*3/uL (ref 1.7–7.7)
Neutrophils Relative %: 55 % (ref 43–77)
Platelets: 254 10*3/uL (ref 150–400)
RBC: 4.29 MIL/uL (ref 4.22–5.81)
RDW: 13.9 % (ref 11.5–15.5)
WBC: 4.7 10*3/uL (ref 4.0–10.5)

## 2014-10-02 LAB — COMPREHENSIVE METABOLIC PANEL
ALT: 18 U/L (ref 0–53)
AST: 22 U/L (ref 0–37)
Albumin: 4.3 g/dL (ref 3.5–5.2)
Alkaline Phosphatase: 67 U/L (ref 39–117)
BILIRUBIN TOTAL: 0.8 mg/dL (ref 0.2–1.2)
BUN: 14 mg/dL (ref 6–23)
CALCIUM: 9.1 mg/dL (ref 8.4–10.5)
CHLORIDE: 103 meq/L (ref 96–112)
CO2: 24 mEq/L (ref 19–32)
Creat: 1 mg/dL (ref 0.50–1.35)
Glucose, Bld: 82 mg/dL (ref 70–99)
Potassium: 3.8 mEq/L (ref 3.5–5.3)
Sodium: 140 mEq/L (ref 135–145)
Total Protein: 6.9 g/dL (ref 6.0–8.3)

## 2014-10-02 LAB — LIPID PANEL
CHOL/HDL RATIO: 3.9 ratio
Cholesterol: 140 mg/dL (ref 0–200)
HDL: 36 mg/dL — AB (ref >39)
LDL CALC: 88 mg/dL (ref 0–99)
Triglycerides: 82 mg/dL (ref <150)
VLDL: 16 mg/dL (ref 0–40)

## 2014-10-02 NOTE — Progress Notes (Signed)
Pre visit review using our clinic review tool, if applicable. No additional management support is needed unless otherwise documented below in the visit note. 

## 2014-10-02 NOTE — Progress Notes (Signed)
   Subjective:    Patient ID: Christopher Santos, male    DOB: 1951/02/20, 63 y.o.   MRN: [id number]  DOS:  10/02/2014 Type of visit - description : cpx Interval history: In general feeling well, patient's wife is concerned about a lump at the  left upper quadrant of the abdomen, patient think is his rib cage.   ROS Diet-- doing well  Exercise-- swimming x 3/week No  CP, SOB Denies  nausea, vomiting diarrhea, blood in the stools (-) cough, sputum production (-) wheezing, chest congestion No dysuria, gross hematuria, difficulty urinating  Some stress, mild anxiety work related, depression     Past Medical History  Diagnosis Date  . Hyperlipemia     Past Surgical History  Procedure Laterality Date  . No past surgeries      History   Social History  . Marital Status: Married    Spouse Name: N/A    Number of Children: 0  . Years of Education: N/A   Occupational History  . IT for [employer]   Social History Main Topics  . Smoking status: Former Research scientist (life sciences)  . Smokeless tobacco: Never Used     Comment: quit 1993, no heavy use   . Alcohol Use: 1.8 oz/week    3 Cans of beer per week     Comment: wine  . Drug Use: No  . Sexual Activity: Not on file   Other Topics Concern  . 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 CAD Father     MI at 67  . Diabetes Neg Hx   . Colon cancer Neg Hx   . Prostate cancer Neg Hx        Medication List       This list is accurate as of: 10/02/14 11:59 PM.  Always use your most recent med list.               aspirin 81 MG tablet  Take 81 mg by mouth daily.     simvastatin 40 MG tablet  Commonly known as:  ZOCOR  TAKE ONE TABLET BY MOUTH NIGHTLY AT BEDTIME           Objective:   Physical Exam BP 123/77  Pulse 72  Temp(Src) 97.5 F (36.4 C) (Oral)  Ht 6\' 3"  (1.905 m)  Wt 203 lb 2 oz  (92.137 kg)  BMI 25.39 kg/m2  SpO2 97%  General -- alert, well-developed, NAD.  Neck --no thyromegaly , normal carotid pulse HEENT-- Not pale.   Lungs -- normal respiratory effort, no intercostal retractions, no accessory muscle use, and normal breath sounds.  Heart-- normal rate, regular rhythm, no murmur.  Abdomen-- Not distended, good bowel sounds,soft, non-tender. No rebound or rigidity. No bruit; no mass , prominent but symmetric rib cage Rectal-- No external abnormalities noted. Normal sphincter tone. No rectal masses or tenderness. No stool    Prostate--Prostate gland firm and smooth, no enlargement, nodularity, tenderness, mass, asymmetry or induration. Extremities--trace  pretibial edema bilaterally  Neurologic--  alert & oriented X3. Speech normal, gait appropriate for age, strength symmetric and appropriate for age.  Psych-- Cognition and judgment appear intact. Cooperative with normal attention span and concentration. No anxious or depressed appearing.       Assessment & Plan:

## 2014-10-02 NOTE — Patient Instructions (Signed)
Get your blood work before you leave    Please come back to the office in 1 year  for a physical exam. Come back fasting    

## 2014-10-02 NOTE — Assessment & Plan Note (Signed)
+   FH CAD, last LDL ~ 100 which is great. Cont meds ,ASA, labs

## 2014-10-02 NOTE — Assessment & Plan Note (Addendum)
Tdap 2013 zostavax -- 2014 Flu shot -- declined   colonoscopy in July 2004 -- biopsy showed tubular adenomas and hyperplastic polyps. colonoscopy 01/2008 colon two polyps, tics, hemorrhoids.   Cscope [id number], next 5 years Dr. Henrene Pastor.     Diet and exercise discussed EKG--nsr Labs

## 2014-10-03 LAB — PSA: PSA: 0.8 ng/mL (ref <=4.00)

## 2014-12-06 ENCOUNTER — Ambulatory Visit (INDEPENDENT_AMBULATORY_CARE_PROVIDER_SITE_OTHER): Payer: BC Managed Care – PPO

## 2014-12-06 ENCOUNTER — Ambulatory Visit (INDEPENDENT_AMBULATORY_CARE_PROVIDER_SITE_OTHER): Payer: BC Managed Care – PPO | Admitting: Physician Assistant

## 2014-12-06 VITALS — BP 126/80 | HR 63 | Temp 98.0°F | Resp 12 | Ht 75.0 in | Wt 206.0 lb

## 2014-12-06 DIAGNOSIS — M25532 Pain in left wrist: Secondary | ICD-10-CM

## 2014-12-06 DIAGNOSIS — S52502A Unspecified fracture of the lower end of left radius, initial encounter for closed fracture: Secondary | ICD-10-CM

## 2014-12-06 NOTE — Patient Instructions (Signed)
Keep the splint on, and dry, at all times. You may remove the sling for comfort. You may use acetaminophen or ibuprofen as needed for pain.

## 2014-12-06 NOTE — Progress Notes (Signed)
   Subjective:    Patient ID: Christopher Santos, male    DOB: 1951-05-30, 63 y.o.   MRN: [id number]   PCP: Kathlene November, MD  Chief Complaint  Patient presents with  . Wrist Injury    Left-Fell yesterday    No Known Allergies  Patient Active Problem List   Diagnosis Date Noted  . COLONIC POLYPS 08/15/2007  . Hyperlipidemia 08/15/2007    Prior to Admission medications   Medication Sig Start Date End Date Taking? Authorizing Provider  aspirin 81 MG tablet Take 81 mg by mouth daily.   Yes Historical Provider, MD  simvastatin (ZOCOR) 40 MG tablet TAKE ONE TABLET BY MOUTH NIGHTLY AT BEDTIME  07/14/14  Yes Colon Branch, MD    Medical, Surgical, Family and Social History reviewed and updated.  HPI  [name] yesterday doing yard work and landed with his arm behind him. Has wrist pain. RIGHT hand dominant.  After the injury, he kept working, just tried not to do as much with the LEFT wrist.  He awoke frequently over night, "Probably every time I rolled over." This morning noted the wrist was kind of swollen.  Review of Systems As above.    Objective:   Physical Exam  Constitutional: He is oriented to person, place, and time. He appears well-developed and well-nourished. He is active and cooperative. No distress.  BP 126/80 mmHg  Pulse 63  Temp(Src) 98 F (36.7 C) (Oral)  Resp 12  Ht 6\' 3"  (1.905 m)  Wt 206 lb (93.441 kg)  BMI 25.75 kg/m2  SpO2 98%   Eyes: Conjunctivae are normal.  Pulmonary/Chest: Effort normal.  Musculoskeletal:       Left wrist: He exhibits decreased range of motion, tenderness, bony tenderness and swelling.       Arms:      Left hand: Normal. Normal sensation noted. Normal strength noted.  Neurological: He is alert and oriented to person, place, and time.  Psychiatric: He has a normal mood and affect. His speech is normal and behavior is normal.   LEFT Wrist: [facility] reading (PRIMARY) by  Dr. Laney Pastor. Nondisplaced fracture of the distal radius involving the  wrist joint.  Sugar tong splint and sling placed.         Assessment & Plan:  1. Wrist pain, acute, left - DG Wrist Complete Left; Future  2. Fracture of distal end of radius, left, closed, initial encounter Splint and sling. Anticipatory guidance provided.  - Ambulatory referral to [place], PA-C Physician Assistant-Cert[facility] [place]

## 2015-02-15 ENCOUNTER — Other Ambulatory Visit: Payer: Self-pay

## 2015-02-15 MED ORDER — SIMVASTATIN 40 MG PO TABS: 40.0000 mg | ORAL_TABLET | Freq: Every day | ORAL | Status: DC

## 2015-06-21 ENCOUNTER — Encounter: Payer: Self-pay | Admitting: Internal Medicine

## 2015-11-03 ENCOUNTER — Telehealth: Payer: Self-pay | Admitting: Behavioral Health

## 2015-11-03 ENCOUNTER — Encounter: Payer: Self-pay | Admitting: Behavioral Health

## 2015-11-03 NOTE — Telephone Encounter (Signed)
Pre-Visit Call completed with patient and chart updated.   Pre-Visit Info documented in Specialty Comments under [employer].    

## 2015-11-04 ENCOUNTER — Ambulatory Visit (INDEPENDENT_AMBULATORY_CARE_PROVIDER_SITE_OTHER): Payer: 59 | Admitting: Internal Medicine

## 2015-11-04 ENCOUNTER — Encounter: Payer: Self-pay | Admitting: Internal Medicine

## 2015-11-04 VITALS — BP 126/70 | HR 51 | Temp 97.9°F | Ht 75.0 in | Wt 207.5 lb

## 2015-11-04 DIAGNOSIS — Z114 Encounter for screening for human immunodeficiency virus [HIV]: Secondary | ICD-10-CM

## 2015-11-04 DIAGNOSIS — Z0000 Encounter for general adult medical examination without abnormal findings: Secondary | ICD-10-CM | POA: Diagnosis not present

## 2015-11-04 DIAGNOSIS — E785 Hyperlipidemia, unspecified: Secondary | ICD-10-CM | POA: Diagnosis not present

## 2015-11-04 DIAGNOSIS — Z09 Encounter for follow-up examination after completed treatment for conditions other than malignant neoplasm: Secondary | ICD-10-CM | POA: Insufficient documentation

## 2015-11-04 DIAGNOSIS — Z1159 Encounter for screening for other viral diseases: Secondary | ICD-10-CM

## 2015-11-04 LAB — TSH: TSH: 0.92 u[IU]/mL (ref 0.35–4.50)

## 2015-11-04 LAB — BASIC METABOLIC PANEL
BUN: 13 mg/dL (ref 6–23)
CO2: 26 mEq/L (ref 19–32)
CREATININE: 0.89 mg/dL (ref 0.40–1.50)
Calcium: 9.8 mg/dL (ref 8.4–10.5)
Chloride: 106 mEq/L (ref 96–112)
GFR: 91.87 mL/min (ref >60.00)
Glucose, Bld: 87 mg/dL (ref 70–99)
Potassium: 4.2 mEq/L (ref 3.5–5.1)
Sodium: 141 mEq/L (ref 135–145)

## 2015-11-04 LAB — LIPID PANEL
CHOL/HDL RATIO: 5
Cholesterol: 182 mg/dL (ref 0–200)
HDL: 38.1 mg/dL — ABNORMAL LOW (ref >39.00)
LDL Cholesterol: 123 mg/dL — ABNORMAL HIGH (ref 0–99)
NONHDL: 144.29
Triglycerides: 105 mg/dL (ref 0.0–149.0)
VLDL: 21 mg/dL (ref 0.0–40.0)

## 2015-11-04 LAB — ALT: ALT: 18 U/L (ref 0–53)

## 2015-11-04 LAB — AST: AST: 20 U/L (ref 0–37)

## 2015-11-04 MED ORDER — SIMVASTATIN 40 MG PO TABS: 40.0000 mg | ORAL_TABLET | Freq: Every day | ORAL | Status: DC

## 2015-11-04 NOTE — Progress Notes (Signed)
Pre visit review using our clinic review tool, if applicable. No additional management support is needed unless otherwise documented below in the visit note. 

## 2015-11-04 NOTE — Assessment & Plan Note (Signed)
Dyslipidemia: Continue statins, refill.   RTC 1 year

## 2015-11-04 NOTE — Patient Instructions (Signed)
Get your blood work before you leave     Next visit  for a   Physical exam in 1 year      Please schedule an appointment at the front desk Please come back fasting

## 2015-11-04 NOTE — Progress Notes (Signed)
Subjective:    Patient ID: Christopher Santos, male    DOB: 1951/09/04, 64 y.o.   MRN: [id number]  DOS:  11/04/2015 Type of visit - description : CPX Interval history: No concerns, good compliance with medications, no apparent side effects    Review of Systems  Constitutional: No fever. No chills. No unexplained wt changes. No unusual sweats  HEENT: No dental problems, no ear discharge, no facial swelling, no voice changes. No eye discharge, no eye  redness , no  intolerance to light   Respiratory: No wheezing , no  difficulty breathing. No cough , no mucus production  Cardiovascular: No CP, no leg swelling , no  Palpitations  GI: no nausea, no vomiting, no diarrhea , no  abdominal pain.  No blood in the stools. No dysphagia, no odynophagia    Endocrine: No polyphagia, no polyuria , no polydipsia  GU: No dysuria, gross hematuria, difficulty urinating. No urinary urgency, no frequency.  Musculoskeletal: No joint swellings or unusual aches or pains  Skin: No change in the color of the skin, palor , no  Rash  Allergic, immunologic: No environmental allergies , no  food allergies  Neurological: No dizziness no  syncope. No headaches. No diplopia, no slurred, no slurred speech, no motor deficits, no facial  Numbness  Hematological: No enlarged lymph nodes, no easy bruising , no unusual bleedings  Psychiatry: No suicidal ideas, no hallucinations, no beavior problems, no confusion.  No unusual/severe anxiety, no depression  Past Medical History  Diagnosis Date  . Hyperlipemia     Past Surgical History  Procedure Laterality Date  . No past surgeries      Social History   Social History  . Marital Status: Married    Spouse Name: N/A  . Number of Children: 0  . Years of Education: N/A   Occupational History  . IT     Social History Main Topics  . Smoking status: Former Research scientist (life sciences)  . Smokeless tobacco: Never Used     Comment: quit 1993, no heavy use   . Alcohol Use:  1.8 oz/week    3 Cans of beer per week     Comment: wine  . Drug Use: No  . Sexual Activity: Not on file   Other Topics Concern  . Not on file   Social History Narrative   Married, no children, original from [place], works for [employer] (doing the same he did at [employer])      Family History  Problem Relation Age of Onset  . CAD Father     MI at 14  . Diabetes Neg Hx   . Colon cancer Neg Hx   . Prostate cancer Neg Hx       Medication List       This list is accurate as of: 11/04/15  2:58 PM.  Always use your most recent med list.               aspirin 81 MG tablet  Take 81 mg by mouth daily.     simvastatin 40 MG tablet  Commonly known as:  ZOCOR  Take 1 tablet (40 mg total) by mouth at bedtime.           Objective:   Physical Exam BP 126/70 mmHg  Pulse 51  Temp(Src) 97.9 F (36.6 C) (Oral)  Ht 6\' 3"  (1.905 m)  Wt 207 lb 8 oz (94.121 kg)  BMI 25.94 kg/m2  SpO2 99% General:   Well developed, well  nourished . NAD.  Neck:  No thyromegaly HEENT:  Normocephalic . Face symmetric, atraumatic Lungs:  CTA B Normal respiratory effort, no intercostal retractions, no accessory muscle use. Heart: RRR,  no murmur.  No pretibial edema bilaterally  Abdomen:  Not distended, soft, non-tender. No rebound or rigidity.   Skin: Exposed areas without rash. Not pale. Not jaundice Neurologic:  alert & oriented X3.  Speech normal, gait appropriate for age and unassisted Strength symmetric and appropriate for age.  Psych: Cognition and judgment appear intact.  Cooperative with normal attention span and concentration.  Behavior appropriate. No anxious or depressed appearing.    Assessment & Plan:   Assessment > Dyslipidemia L wrist Fx 2015    Plan: Dyslipidemia: Continue statins, refill.   RTC 1 year

## 2015-11-04 NOTE — Assessment & Plan Note (Addendum)
Tdap 2013 zostavax -- 2014 Flu shot -- declined   colonoscopy in July 2004 -- biopsy showed tubular adenomas and hyperplastic polyps. colonoscopy 01/2008 colon two polyps, tics, hemorrhoids.   Cscope [id number], next 5 years Dr. Henrene Pastor.    Prostate cancer screening, DRE normal 2015, PSA consistently normal, reassess next year Diet and exercise discussed. He is doing well, swims regularly   Labs

## 2015-11-05 LAB — HEPATITIS C ANTIBODY: HCV Ab: NEGATIVE

## 2015-11-05 LAB — HIV ANTIBODY (ROUTINE TESTING W REFLEX): HIV 1&2 Ab, 4th Generation: NONREACTIVE

## 2016-11-09 ENCOUNTER — Ambulatory Visit (HOSPITAL_BASED_OUTPATIENT_CLINIC_OR_DEPARTMENT_OTHER)
Admission: RE | Admit: 2016-11-09 | Discharge: 2016-11-09 | Disposition: A | Discharge status: Home / Self Care | Payer: 59 | Source: Ambulatory Visit | Attending: Internal Medicine | Admitting: Internal Medicine

## 2016-11-09 ENCOUNTER — Ambulatory Visit (INDEPENDENT_AMBULATORY_CARE_PROVIDER_SITE_OTHER): Payer: 59 | Admitting: Internal Medicine

## 2016-11-09 ENCOUNTER — Encounter: Payer: Self-pay | Admitting: Internal Medicine

## 2016-11-09 VITALS — BP 124/78 | HR 58 | Temp 97.6°F | Resp 12 | Ht 75.0 in | Wt 209.0 lb

## 2016-11-09 DIAGNOSIS — M79621 Pain in right upper arm: Secondary | ICD-10-CM | POA: Diagnosis present

## 2016-11-09 DIAGNOSIS — Z0000 Encounter for general adult medical examination without abnormal findings: Secondary | ICD-10-CM | POA: Diagnosis not present

## 2016-11-09 DIAGNOSIS — M79601 Pain in right arm: Secondary | ICD-10-CM

## 2016-11-09 LAB — CBC WITH DIFFERENTIAL/PLATELET
BASOS ABS: 0 10*3/uL (ref 0.0–0.1)
Basophils Relative: 0.5 % (ref 0.0–3.0)
Eosinophils Absolute: 0.2 10*3/uL (ref 0.0–0.7)
Eosinophils Relative: 3.8 % (ref 0.0–5.0)
HEMATOCRIT: 43.5 % (ref 39.0–52.0)
Hemoglobin: 15.1 g/dL (ref 13.0–17.0)
LYMPHS PCT: 28.6 % (ref 12.0–46.0)
Lymphs Abs: 1.4 10*3/uL (ref 0.7–4.0)
MCHC: 34.7 g/dL (ref 30.0–36.0)
MCV: 93.7 fl (ref 78.0–100.0)
MONOS PCT: 17 % — AB (ref 3.0–12.0)
Monocytes Absolute: 0.8 10*3/uL (ref 0.1–1.0)
NEUTROS ABS: 2.5 10*3/uL (ref 1.4–7.7)
Neutrophils Relative %: 50.1 % (ref 43.0–77.0)
Platelets: 288 10*3/uL (ref 150.0–400.0)
RBC: 4.64 Mil/uL (ref 4.22–5.81)
RDW: 13.1 % (ref 11.5–15.5)
WBC: 4.9 10*3/uL (ref 4.0–10.5)

## 2016-11-09 LAB — LIPID PANEL
CHOL/HDL RATIO: 4
Cholesterol: 156 mg/dL (ref 0–200)
HDL: 39.6 mg/dL (ref >39.00)
LDL Cholesterol: 100 mg/dL — ABNORMAL HIGH (ref 0–99)
NONHDL: 116.6
TRIGLYCERIDES: 82 mg/dL (ref 0.0–149.0)
VLDL: 16.4 mg/dL (ref 0.0–40.0)

## 2016-11-09 LAB — BASIC METABOLIC PANEL
BUN: 15 mg/dL (ref 6–23)
CO2: 28 mEq/L (ref 19–32)
Calcium: 9.5 mg/dL (ref 8.4–10.5)
Chloride: 104 mEq/L (ref 96–112)
Creatinine, Ser: 0.95 mg/dL (ref 0.40–1.50)
GFR: 84.93 mL/min (ref >60.00)
Glucose, Bld: 98 mg/dL (ref 70–99)
POTASSIUM: 4.4 meq/L (ref 3.5–5.1)
Sodium: 139 mEq/L (ref 135–145)

## 2016-11-09 LAB — ALT: ALT: 19 U/L (ref 0–53)

## 2016-11-09 LAB — AST: AST: 20 U/L (ref 0–37)

## 2016-11-09 NOTE — Progress Notes (Signed)
Subjective:    Patient ID: Christopher Santos, male    DOB: 1951/08/06, 65 y.o.   MRN: [id number]  DOS:  11/09/2016 Type of visit - description : cpx Interval history:In general feeling well, he remains active and eats healthy.     Review of Systems Constitutional: No fever. No chills. No unexplained wt changes. No unusual sweats  HEENT: No dental problems, no ear discharge, no facial swelling, no voice changes. No eye discharge, no eye  redness , no  intolerance to light   Respiratory: No wheezing , no  difficulty breathing. No cough , no mucus production  Cardiovascular: No CP, no leg swelling , no  Palpitations  GI: no nausea, no vomiting, no diarrhea , no  abdominal pain.  No blood in the stools. No dysphagia, no odynophagia    Endocrine: No polyphagia, no polyuria , no polydipsia  GU: No dysuria, gross hematuria, difficulty urinating. No urinary urgency, no frequency.  Musculoskeletal:  several months history of right arm pain, at the distal lateral deltoid area. It wakes him up some nights, decrease when he raises his arm. Denies neck pain or paresthesias.  Skin: No change in the color of the skin, palor , no  Rash  Allergic, immunologic: No environmental allergies , no  food allergies  Neurological: No dizziness no  syncope. No headaches. No diplopia, no slurred, no slurred speech, no motor deficits, no facial  Numbness  Hematological: No enlarged lymph nodes, no easy bruising , no unusual bleedings  Psychiatry: No suicidal ideas, no hallucinations, no beavior problems, no confusion.  No unusual/severe anxiety, no depression   Past Medical History:  Diagnosis Date  . Hyperlipemia     Past Surgical History:  Procedure Laterality Date  . TONSILLECTOMY     at age 50    Social History   Social History  . Marital status: Married    Spouse name: N/A  . Number of children: 0  . Years of education: N/A   Occupational History  . IT , [employer] (used to be [employer])      Social History Main Topics  . Smoking status: Former Research scientist (life sciences)  . Smokeless tobacco: Never Used     Comment: quit 1993, no heavy use   . Alcohol use 1.8 oz/week    3 Cans of beer per week     Comment: wine  . Drug use: No  . Sexual activity: Not on file   Other Topics Concern  . Not on file   Social History Narrative   Married, no children, original from [place], works for [employer] (doing the same he did at [employer])      Family History  Problem Relation Age of Onset  . CAD Father 30    MI at 59  . Diabetes Neg Hx   . Colon cancer Neg Hx   . Prostate cancer Neg Hx        Medication List       Accurate as of 11/09/16 11:59 PM. Always use your most recent med list.          aspirin 81 MG tablet Take 81 mg by mouth daily.   simvastatin 40 MG tablet Commonly known as:  ZOCOR Take 1 tablet (40 mg total) by mouth at bedtime.          Objective:   Physical Exam  Musculoskeletal:       Arms:  BP 124/78 (BP Location: Left Arm, Patient Position: Sitting, Cuff Size: Normal)  Pulse (!) 58   Temp 97.6 F (36.4 C) (Oral)   Resp 12   Ht 6\' 3"  (1.905 m)   Wt 209 lb (94.8 kg)   SpO2 98%   BMI 26.12 kg/m   General:   Well developed, well nourished . NAD.  Neck: Full range of motion. No  thyromegaly  HEENT:  Normocephalic . Face symmetric, atraumatic Lungs:  CTA B Normal respiratory effort, no intercostal retractions, no accessory muscle use. Heart: RRR,  no murmur.  No pretibial edema bilaterally  Abdomen:  Not distended, soft, non-tender. No rebound or rigidity.   Skin: Exposed areas without rash. Not pale. Not jaundice MSK: Shoulders symmetric, no TTP and full range of motion Neurologic:  alert & oriented X3.  Speech normal, gait appropriate for age and unassisted Strength symmetric and appropriate for age.  Psych: Cognition and judgment appear intact.  Cooperative with normal attention span and concentration.  Behavior appropriate. No anxious or depressed  appearing.    Assessment & Plan:   Assessment > Dyslipidemia L wrist Fx 2015   PLAN: Hyperlipidemia: Continue simvastatin, checking labs. Right arm pain: Does not seem to have primarily a shoulder problem. Will get x-ray and refer to sports medicine RTC one year.

## 2016-11-09 NOTE — Progress Notes (Signed)
Pre visit review using our clinic review tool, if applicable. No additional management support is needed unless otherwise documented below in the visit note. 

## 2016-11-09 NOTE — Assessment & Plan Note (Addendum)
Tdap 2013;  zostavax -- 2014;  Flu shot -- declined   colonoscopy in July 2004 -- bx- tubular adenomas and hyperplastic polyps. colonoscopy 01/2008 colon two polyps, tics, hemorrhoids.   Cscope [id number], next 5 years Dr. Henrene Pastor.    Prostate cancer screening, PSA consistently normal. Reassess the need for screening in 2018.  Diet and exercise discussed. Continued to do well. Very active Labs: BMP, AST, ALT, FLP, CBC.

## 2016-11-09 NOTE — Patient Instructions (Signed)
GO TO THE LAB : Get the blood work     GO TO THE FRONT DESK Schedule your next appointment for a  complete physical exam in one year, fasting      STOP BY THE FIRST FLOOR:  get the XR

## 2016-11-10 NOTE — Assessment & Plan Note (Signed)
Hyperlipidemia: Continue simvastatin, checking labs. Right arm pain: Does not seem to have primarily a shoulder problem. Will get x-ray and refer to sports medicine RTC one year.

## 2016-11-14 ENCOUNTER — Encounter: Payer: Self-pay | Admitting: Family Medicine

## 2016-11-14 ENCOUNTER — Ambulatory Visit (INDEPENDENT_AMBULATORY_CARE_PROVIDER_SITE_OTHER): Payer: 59 | Admitting: Family Medicine

## 2016-11-14 DIAGNOSIS — M25511 Pain in right shoulder: Secondary | ICD-10-CM | POA: Diagnosis not present

## 2016-11-14 DIAGNOSIS — G8929 Other chronic pain: Secondary | ICD-10-CM

## 2016-11-14 MED ORDER — MELOXICAM 15 MG PO TABS: 15.0000 mg | ORAL_TABLET | Freq: Every day | ORAL | 2 refills | Status: DC

## 2016-11-14 NOTE — Patient Instructions (Signed)
You have rotator cuff impingement Try to avoid painful activities (overhead activities, lifting with extended arm) as much as possible. Meloxicam 15mg  a day with food for pain and inflammation - typically take regularly for 7-10 days then as needed. Can take tylenol in addition to this. Subacromial injection may be beneficial to help with pain and to decrease inflammation. Consider physical therapy with transition to home exercise program. Do home exercise program with theraband and scapular stabilization exercises daily - these are very important for long term relief even if an injection was given.  3 sets of 10 once a day. If not improving at follow-up we will consider further imaging, injection, physical therapy, and/or nitro patches. Follow up with me in 5-6 weeks for reevaluation.

## 2016-11-19 DIAGNOSIS — M67912 Unspecified disorder of synovium and tendon, left shoulder: Secondary | ICD-10-CM | POA: Insufficient documentation

## 2016-11-19 DIAGNOSIS — M25511 Pain in right shoulder: Secondary | ICD-10-CM | POA: Insufficient documentation

## 2016-11-19 NOTE — Assessment & Plan Note (Signed)
2/2 rotator cuff impingement.  Shown home exercises to do daily.  Start meloxicam daily with food.  Discussed imaging, injection, PT, nitro patches if not improving.  F/u in 5-6 weeks.

## 2016-11-19 NOTE — Progress Notes (Signed)
PCP and consultation requested by: Kathlene November, MD  Subjective:   HPI: Patient is a 65 y.o. male here for right shoulder pain.  Patient reports he's had lateral right shoulder/arm pain for 4 months. Pain is 0/10 currently but up to 7/10, sharp. Worse at nighttime. No known injury or trauma. Has not tried anything for this. No problems with swimming. No skin changes, numbness.  Past Medical History:  Diagnosis Date  . Hyperlipemia     Current Outpatient Prescriptions on File Prior to Visit  Medication Sig Dispense Refill  . aspirin 81 MG tablet Take 81 mg by mouth daily.    . simvastatin (ZOCOR) 40 MG tablet Take 1 tablet (40 mg total) by mouth at bedtime. 30 tablet 12   No current facility-administered medications on file prior to visit.     Past Surgical History:  Procedure Laterality Date  . TONSILLECTOMY     at age 36    No Known Allergies  Social History   Social History  . Marital status: Married    Spouse name: N/A  . Number of children: 0  . Years of education: N/A   Occupational History  . IT , [employer] (used to be [employer])    Social History Main Topics  . Smoking status: Former Research scientist (life sciences)  . Smokeless tobacco: Never Used     Comment: quit 1993, no heavy use   . Alcohol use 1.8 oz/week    3 Cans of beer per week     Comment: wine  . Drug use: No  . Sexual activity: Not on file   Other Topics Concern  . Not on file   Social History Narrative   Married, no children, original from [place], works for [employer] (doing the same he did at [employer])     Family History  Problem Relation Age of Onset  . CAD Father 11    MI at 70  . Diabetes Neg Hx   . Colon cancer Neg Hx   . Prostate cancer Neg Hx     BP 134/88   Pulse 70   Ht 6\' 3"  (1.905 m)   Wt 200 lb (90.7 kg)   BMI 25.00 kg/m   Review of Systems: See HPI above.     Objective:  Physical Exam:  Gen: NAD, comfortable in exam room  Right shoulder: No swelling, ecchymoses.  No gross deformity. No  TTP. FROM with mild painful arc. Positive Hawkins, negative Neers. Negative Yergasons. Strength 5/5 with empty can and resisted internal/external rotation. Negative apprehension. NV intact distally.  Left shoulder: FROM without pain.   Assessment & Plan:  1. Right shoulder pain - 2/2 rotator cuff impingement.  Shown home exercises to do daily.  Start meloxicam daily with food.  Discussed imaging, injection, PT, nitro patches if not improving.  F/u in 5-6 weeks.

## 2016-11-26 ENCOUNTER — Other Ambulatory Visit: Payer: Self-pay | Admitting: Internal Medicine

## 2016-12-27 ENCOUNTER — Encounter: Payer: Self-pay | Admitting: Family Medicine

## 2016-12-27 ENCOUNTER — Ambulatory Visit (INDEPENDENT_AMBULATORY_CARE_PROVIDER_SITE_OTHER): Payer: 59 | Admitting: Family Medicine

## 2016-12-27 DIAGNOSIS — M25511 Pain in right shoulder: Secondary | ICD-10-CM | POA: Diagnosis not present

## 2016-12-27 DIAGNOSIS — G8929 Other chronic pain: Secondary | ICD-10-CM | POA: Diagnosis not present

## 2016-12-27 MED ORDER — MELOXICAM 15 MG PO TABS: 15.0000 mg | ORAL_TABLET | Freq: Every day | ORAL | 2 refills | Status: DC | PRN

## 2016-12-27 MED ORDER — NITROGLYCERIN 0.2 MG/HR TD PT24: MEDICATED_PATCH | TRANSDERMAL | 1 refills | Status: DC

## 2016-12-27 NOTE — Patient Instructions (Signed)
You have rotator cuff impingement Try to avoid painful activities (overhead activities, lifting with extended arm) as much as possible. Take meloxicam only if needed at this point. Can take tylenol in addition to this. Nitro patches 1/4th patch to affected shoulder, change daily. Do home exercise program with theraband and scapular stabilization exercises daily - these are very important for long term relief even if an injection was given.  3 sets of 10 once a day. If not improving at follow-up we will consider further imaging, injection, physical therapy. Follow up with me in 5-6 weeks for reevaluation.

## 2016-12-28 NOTE — Assessment & Plan Note (Signed)
2/2 rotator cuff impingement.  Clinically improving.  We discussed options - he would like to continue with meloxicam and home exercises but do exercises more regularly.  Will also trial nitro patches.  Will consider imaging, injection, PT if not improving.  F/u in 5-6 weeks.

## 2016-12-28 NOTE — Progress Notes (Signed)
PCP and consultation requested by: Kathlene November, MD  Subjective:   HPI: Patient is a 66 y.o. male here for right shoulder pain.  11/21: Patient reports he's had lateral right shoulder/arm pain for 4 months. Pain is 0/10 currently but up to 7/10, sharp. Worse at nighttime. No known injury or trauma. Has not tried anything for this. No problems with swimming. No skin changes, numbness.  12/27/16: Patient reports he has improved about 50% from last visit. Taking mobic occasionally and not doing home exercises as much as he should per his report. He has been resting right arm as well. Pain up to 5/10 and sharp, lateral. Worse reaching backwards. No skin changes, numbness.  Past Medical History:  Diagnosis Date  . Hyperlipemia     Current Outpatient Prescriptions on File Prior to Visit  Medication Sig Dispense Refill  . aspirin 81 MG tablet Take 81 mg by mouth daily.    . simvastatin (ZOCOR) 40 MG tablet Take 1 tablet (40 mg total) by mouth at bedtime. 90 tablet 3   No current facility-administered medications on file prior to visit.     Past Surgical History:  Procedure Laterality Date  . TONSILLECTOMY     at age 33    No Known Allergies  Social History   Social History  . Marital status: Married    Spouse name: N/A  . Number of children: 0  . Years of education: N/A   Occupational History  . IT , [employer] (used to be [employer])    Social History Main Topics  . Smoking status: Former Research scientist (life sciences)  . Smokeless tobacco: Never Used     Comment: quit 1993, no heavy use   . Alcohol use 1.8 oz/week    3 Cans of beer per week     Comment: wine  . Drug use: No  . Sexual activity: Not on file   Other Topics Concern  . Not on file   Social History Narrative   Married, no children, original from [place], works for [employer] (doing the same he did at [employer])     Family History  Problem Relation Age of Onset  . CAD Father 22    MI at 83  . Diabetes Neg Hx   . Colon cancer Neg Hx   .  Prostate cancer Neg Hx     BP (!) 160/98   Pulse (!) 59   Ht 6\' 3"  (1.905 m)   Wt 205 lb (93 kg)   BMI 25.62 kg/m   Review of Systems: See HPI above.     Objective:  Physical Exam:  Gen: NAD, comfortable in exam room  Right shoulder: No swelling, ecchymoses.  No gross deformity. No TTP. FROM with negative painful arc. Minimally positive Hawkins, negative Neers. Negative Yergasons. Strength 5/5 with empty can and resisted internal/external rotation. Negative apprehension. NV intact distally.  Left shoulder: FROM without pain.   Assessment & Plan:  1. Right shoulder pain - 2/2 rotator cuff impingement.  Clinically improving.  We discussed options - he would like to continue with meloxicam and home exercises but do exercises more regularly.  Will also trial nitro patches.  Will consider imaging, injection, PT if not improving.  F/u in 5-6 weeks.

## 2017-11-12 ENCOUNTER — Ambulatory Visit (INDEPENDENT_AMBULATORY_CARE_PROVIDER_SITE_OTHER): Payer: 59 | Admitting: Internal Medicine

## 2017-11-12 ENCOUNTER — Encounter: Payer: Self-pay | Admitting: Internal Medicine

## 2017-11-12 VITALS — BP 128/70 | HR 49 | Temp 98.2°F | Resp 14 | Ht 75.0 in | Wt 210.2 lb

## 2017-11-12 DIAGNOSIS — Z0000 Encounter for general adult medical examination without abnormal findings: Secondary | ICD-10-CM

## 2017-11-12 LAB — COMPREHENSIVE METABOLIC PANEL
ALT: 15 U/L (ref 0–53)
AST: 22 U/L (ref 0–37)
Albumin: 4.3 g/dL (ref 3.5–5.2)
Alkaline Phosphatase: 68 U/L (ref 39–117)
BILIRUBIN TOTAL: 0.7 mg/dL (ref 0.2–1.2)
BUN: 14 mg/dL (ref 6–23)
CALCIUM: 9.7 mg/dL (ref 8.4–10.5)
CO2: 31 meq/L (ref 19–32)
Chloride: 106 mEq/L (ref 96–112)
Creatinine, Ser: 0.88 mg/dL (ref 0.40–1.50)
GFR: 92.47 mL/min (ref >60.00)
Glucose, Bld: 102 mg/dL — ABNORMAL HIGH (ref 70–99)
POTASSIUM: 4.4 meq/L (ref 3.5–5.1)
Sodium: 142 mEq/L (ref 135–145)
Total Protein: 6.7 g/dL (ref 6.0–8.3)

## 2017-11-12 LAB — LIPID PANEL
CHOL/HDL RATIO: 4
Cholesterol: 160 mg/dL (ref 0–200)
HDL: 40.5 mg/dL (ref >39.00)
LDL Cholesterol: 103 mg/dL — ABNORMAL HIGH (ref 0–99)
NonHDL: 119.67
TRIGLYCERIDES: 84 mg/dL (ref 0.0–149.0)
VLDL: 16.8 mg/dL (ref 0.0–40.0)

## 2017-11-12 LAB — PSA: PSA: 1.29 ng/mL (ref 0.10–4.00)

## 2017-11-12 MED ORDER — VALACYCLOVIR HCL 1 G PO TABS: 1000.0000 mg | ORAL_TABLET | Freq: Two times a day (BID) | ORAL | 0 refills | Status: DC

## 2017-11-12 NOTE — Assessment & Plan Note (Addendum)
-  Tdap 2013;  zostavax: 2014;  Flu shot: declined  -[facility]: colonoscopy in July 2004 -- bx- tubular adenomas and hyperplastic polyps. colonoscopy 01/2008 colon two polyps, tics, hemorrhoids.   Cscope [id number], next 5 years Dr. Henrene Pastor.    -Prostate cancer screening: DRE wnl today, check a PSA -Diet and exercise discussed.  -Labs: CMP, PT, PSA

## 2017-11-12 NOTE — Assessment & Plan Note (Signed)
Hyperlipidemia: On simvastatin, checking labs Oral ulcers: Suspect HSV, ulcers are bothersome, recommend Valtrex 1 tablet twice a day at the first symptom.  Only one day per episode. RTC 1 year

## 2017-11-12 NOTE — Progress Notes (Signed)
Pre visit review using our clinic review tool, if applicable. No additional management support is needed unless otherwise documented below in the visit note. 

## 2017-11-12 NOTE — Patient Instructions (Signed)
GO TO THE LAB : Get the blood work     GO TO THE FRONT DESK Schedule your next appointment for a physical exam in 1 year  Use Valtrex 1 tablet twice a day for 1 day only for each episode of oral ulcers

## 2017-11-12 NOTE — Progress Notes (Signed)
Subjective:    Patient ID: Christopher Santos, male    DOB: 1951-07-30, 66 y.o.   MRN: [id number]  DOS:  11/12/2017 Type of visit - description : CPX Interval history: No major concerns  Review of Systems Occasionally mouth ulcers, somewhat painful, they  last about a week.  Dentist prescribed "Magic wash" helps a little.   Other than above, a 14 point review of systems is negative     Past Medical History:  Diagnosis Date  . Hyperlipemia     Past Surgical History:  Procedure Laterality Date  . TONSILLECTOMY     at age 69    Social History   Socioeconomic History  . Marital status: Married    Spouse name: Not on file  . Number of children: 0  . Years of education: Not on file  . Highest education level: Not on file  Social Needs  . Financial resource strain: Not on file  . Food insecurity - worry: Not on file  . Food insecurity - inability: Not on file  . Transportation needs - medical: Not on file  . Transportation needs - non-medical: Not on file  Occupational History  . Occupation: IT , [employer] (used to be [employer])  Tobacco Use  . Smoking status: Former Research scientist (life sciences)  . Smokeless tobacco: Never Used  . Tobacco comment: quit 1993, no heavy use   Substance and Sexual Activity  . Alcohol use: Yes    Alcohol/week: 1.8 oz    Types: 3 Cans of beer per week    Comment: wine  . Drug use: No  . Sexual activity: Not on file  Other Topics Concern  . Not on file  Social History Narrative   Married, no children, original from [place], works for [employer] (doing the same he did at [employer])      Family History  Problem Relation Age of Onset  . CAD Father 26       MI at 9  . Diabetes Neg Hx   . Colon cancer Neg Hx   . Prostate cancer Neg Hx      Allergies as of 11/12/2017   No Known Allergies     Medication List        Accurate as of 11/12/17  1:53 PM. Always use your most recent med list.          aspirin 81 MG tablet Take 81 mg by mouth daily.   simvastatin 40 MG  tablet Commonly known as:  ZOCOR Take 1 tablet (40 mg total) by mouth at bedtime.   valACYclovir 1000 MG tablet Commonly known as:  VALTREX Take 1 tablet (1,000 mg total) 2 (two) times daily by mouth.          Objective:   Physical Exam BP 128/70 (BP Location: Left Arm, Patient Position: Sitting, Cuff Size: Small)   Pulse (!) 49   Temp 98.2 F (36.8 C) (Oral)   Resp 14   Ht 6\' 3"  (1.905 m)   Wt 210 lb 4 oz (95.4 kg)   SpO2 98%   BMI 26.28 kg/m  General:   Well developed, well nourished . NAD.  Neck: No  thyromegaly  HEENT:  Normocephalic . Face symmetric, atraumatic Lungs:  CTA B Normal respiratory effort, no intercostal retractions, no accessory muscle use. Heart: RRR,  no murmur.  No pretibial edema bilaterally  Abdomen:  Not distended, soft, non-tender. No rebound or rigidity.   Skin: Exposed areas without rash. Not pale. Not jaundice Rectal:  External abnormalities: none. Normal sphincter tone. No rectal masses or tenderness.  Stool brown  Prostate: Prostate gland firm and smooth, no enlargement, nodularity, tenderness, mass, asymmetry or induration.  Neurologic:  alert & oriented X3.  Speech normal, gait appropriate for age and unassisted Strength symmetric and appropriate for age.  Psych: Cognition and judgment appear intact.  Cooperative with normal attention span and concentration.  Behavior appropriate. No anxious or depressed appearing.     Assessment & Plan:  Assessment > Dyslipidemia L wrist Fx 2015   PLAN: Hyperlipidemia: On simvastatin, checking labs Oral ulcers: Suspect HSV, ulcers are bothersome, recommend Valtrex 1 tablet twice a day at the first symptom.  Only one day per episode. RTC 1 year

## 2017-12-03 ENCOUNTER — Other Ambulatory Visit: Payer: Self-pay | Admitting: Internal Medicine

## 2018-02-25 ENCOUNTER — Encounter: Payer: Self-pay | Admitting: Family Medicine

## 2018-02-25 ENCOUNTER — Ambulatory Visit: Payer: 59 | Admitting: Family Medicine

## 2018-02-25 VITALS — BP 144/97 | HR 83 | Ht 75.0 in | Wt 210.0 lb

## 2018-02-25 DIAGNOSIS — M79605 Pain in left leg: Secondary | ICD-10-CM | POA: Diagnosis not present

## 2018-02-25 DIAGNOSIS — M545 Low back pain, unspecified: Secondary | ICD-10-CM | POA: Insufficient documentation

## 2018-02-25 DIAGNOSIS — M5416 Radiculopathy, lumbar region: Secondary | ICD-10-CM

## 2018-02-25 MED ORDER — CYCLOBENZAPRINE HCL 10 MG PO TABS: 10.0000 mg | ORAL_TABLET | Freq: Three times a day (TID) | ORAL | 1 refills | Status: DC | PRN

## 2018-02-25 MED ORDER — NAPROXEN 500 MG PO TABS: 500.0000 mg | ORAL_TABLET | Freq: Two times a day (BID) | ORAL | 2 refills | Status: DC | PRN

## 2018-02-25 NOTE — Assessment & Plan Note (Signed)
consistent with lumbar radiculopathy, likely L4.  Finish prednisone dose pack.  Flexeril as needed.  Start naproxen after finishing prednisone.  Start physical therapy.  F/u in 6 weeks.  Consider MRI if not improving.

## 2018-02-25 NOTE — Patient Instructions (Signed)
You have lumbar radiculopathy (a pinched nerve in your low back). Finish the prednisone as directed. Day after finishing prednisone start naproxen 500mg  twice a day with food for pain and inflammation. Flexeril as needed for muscle spasms (no driving on this medicine if it makes you sleepy). Stay as active as possible. Physical therapy has been shown to be helpful as well - start this. Strengthening of low back muscles, abdominal musculature are key for long term pain relief. If not improving, will consider further imaging (MRI). Follow up with me in 6 weeks but call me sooner if you're struggling.

## 2018-02-25 NOTE — Progress Notes (Signed)
PCP: Colon Branch, MD  Subjective:   HPI: Patient is a 67 y.o. male here for low back pain.  Patient reports about 1 week ago [place]  Shortly after this also took a wrong step and felt a sharp pain in left side of low back radiating into his left leg to just above the knee. Associated numbness in same distribution to anterior left thigh. No bowel/bladder dysfunction. Currently taking medrol dose pack, flexeril as needed. Took hydrocodone as needed. Pain level 7/10, sharp but improved from Thursday when this was at its worst.  Past Medical History:  Diagnosis Date  . Hyperlipemia     Current Outpatient Medications on File Prior to Visit  Medication Sig Dispense Refill  . aspirin 81 MG tablet Take 81 mg by mouth daily.    . methylPREDNISolone (MEDROL DOSEPAK) 4 MG TBPK tablet     . simvastatin (ZOCOR) 40 MG tablet TAKE 1 TABLET (40 MG TOTAL) BY MOUTH AT BEDTIME. 90 tablet 3  . valACYclovir (VALTREX) 1000 MG tablet Take 1 tablet (1,000 mg total) 2 (two) times daily by mouth. 20 tablet 0   No current facility-administered medications on file prior to visit.     Past Surgical History:  Procedure Laterality Date  . TONSILLECTOMY     at age 11    No Known Allergies  Social History   Socioeconomic History  . Marital status: Married    Spouse name: Not on file  . Number of children: 0  . Years of education: Not on file  . Highest education level: Not on file  Social Needs  . Financial resource strain: Not on file  . Food insecurity - worry: Not on file  . Food insecurity - inability: Not on file  . Transportation needs - medical: Not on file  . Transportation needs - non-medical: Not on file  Occupational History  . Occupation: IT , [employer] (used to be [employer])  Tobacco Use  . Smoking status: Former Research scientist (life sciences)  . Smokeless tobacco: Never Used  . Tobacco comment: quit 1993, no heavy use   Substance and Sexual Activity  . Alcohol use: Yes   Alcohol/week: 1.8 oz    Types: 3 Cans of beer per week    Comment: wine  . Drug use: No  . Sexual activity: Not on file  Other Topics Concern  . Not on file  Social History Narrative   Married, no children, original from [place], works for [employer] (doing the same he did at [employer])     Family History  Problem Relation Age of Onset  . CAD Father 96       MI at 48  . Diabetes Neg Hx   . Colon cancer Neg Hx   . Prostate cancer Neg Hx     BP (!) 144/97   Pulse 83   Ht 6\' 3"  (1.905 m)   Wt 210 lb (95.3 kg)   BMI 26.25 kg/m   Review of Systems: See HPI above.     Objective:  Physical Exam:  Gen: NAD, comfortable in exam room  Back: No gross deformity, scoliosis. TTP mildly left paraspinal lumbar region.  No midline or bony TTP. FROM with pain on flexion and extension. Strength LEs 5/5 all muscle groups.   2+ MSRs in patellar and achilles tendons, equal bilaterally. Mild positive left SLR. Sensation intact to light touch bilaterally.  Left hip: No deformity. No TTP. FROM with 5/5 strength. NVI distally. Negative  logroll. Negative fabers and piriformis.   Assessment & Plan:  1. Low back pain with radiation into left leg - consistent with lumbar radiculopathy, likely L4.  Finish prednisone dose pack.  Flexeril as needed.  Start naproxen after finishing prednisone.  Start physical therapy.  F/u in 6 weeks.  Consider MRI if not improving.

## 2018-02-27 ENCOUNTER — Ambulatory Visit: Payer: 59 | Admitting: Family Medicine

## 2018-03-04 ENCOUNTER — Other Ambulatory Visit: Payer: Self-pay

## 2018-03-04 ENCOUNTER — Ambulatory Visit: Payer: 59 | Attending: Family Medicine | Admitting: Physical Therapy

## 2018-03-04 ENCOUNTER — Encounter: Payer: Self-pay | Admitting: Physical Therapy

## 2018-03-04 DIAGNOSIS — M5442 Lumbago with sciatica, left side: Secondary | ICD-10-CM | POA: Insufficient documentation

## 2018-03-04 DIAGNOSIS — R29898 Other symptoms and signs involving the musculoskeletal system: Secondary | ICD-10-CM | POA: Diagnosis present

## 2018-03-04 DIAGNOSIS — R252 Cramp and spasm: Secondary | ICD-10-CM | POA: Diagnosis present

## 2018-03-04 NOTE — Patient Instructions (Signed)
Therapeutic - Prone Press-Up   Push up so chest is off floor and back is arched. Do not raise hips. Hold __15-20__ seconds. Return to prone position. Repeat __5-10__ times.  Hamstring Step 2   Left foot relaxed, knee straight, other leg bent, foot flat. Raise straight leg further upward to maximal range. Hold _30__ seconds. Relax leg completely down. Repeat __3_ times.  Outer Hip Stretch: Reclined IT Band Stretch (Strap)   Strap around opposite foot, pull across only as far as possible with shoulders on mat. Hold for __30__ breaths. Repeat __3__ times each leg.  Piriformis Stretch   Lying on back, pull right knee toward opposite shoulder. Hold __30__ seconds. Repeat __3__ times.   Bridging   Slowly raise buttocks from floor, keeping stomach tight. Repeat __15__ times per set. Do ___2_ sets per session.   Bridging (Single Leg)   Lie on back with feet shoulder width apart and right leg straight. Lift hips toward the ceiling while keeping leg straight.  Repeat __15__ times. Do __2_ sessions per day.  ABDUCTION: Side-Lying (Active)   Lie on right side, top leg straight. Raise top leg as far as possible. Use __0_ lbs. Complete _1-2__ sets of _10-15__ repetitions.   Cat Back   On hands and knees, exhale and round back up. Inhale and arch back down. Hold each position _5-10__ seconds. Repeat __10_ times per session.   Child Pose   Sitting on knees, fold body over legs and relax head and arms on floor. Hold for __20-30__ breaths.  Mini Squat: Double Leg   With feet shoulder width apart, reach forward for balance and do a mini squat. Keep knees in line with second toe. Knees do not go past toes. Repeat __10-15_ times per set.

## 2018-03-04 NOTE — Therapy (Signed)
[facility] [address]  [place], [place], [address] Phone: [phone]   Fax:  [phone]  Physical Therapy Evaluation  Patient Details  Name: Christopher Santos MRN: [id number] Date of Birth: [birth year] Referring Provider: Dr. Karlton Lemon   Encounter Date: 03/04/2018  PT End of Session - 03/04/18 1629    Visit Number  1    Number of Visits  12    Date for PT Re-Evaluation  04/15/18    PT Start Time  1528    PT Stop Time  1610    PT Time Calculation (min)  42 min    Activity Tolerance  Patient tolerated treatment well    Behavior During Therapy  [facility] for tasks assessed/performed       Past Medical History:  Diagnosis Date  . Hyperlipemia     Past Surgical History:  Procedure Laterality Date  . TONSILLECTOMY     at age 67    There were no vitals filed for this visit.   Subjective Assessment - 03/04/18 1529    Subjective  reports stepped wrong - had some pain following this. Pain with getting out of low car - difficulty sitting and standing. Feeling better, but still some symptoms in back and front of L leg. N&T into anterior thigh. Heating pad and hot tub has been of relief. movement typically makes pain worse.     Pertinent History  n/a    Diagnostic tests  none    Patient Stated Goals  improve pain    Currently in Pain?  Yes    Pain Score  2     Pain Location  Back    Pain Orientation  Left    Pain Descriptors / Indicators  Aching;Sore    Pain Type  Acute pain    Pain Onset  1 to 4 weeks ago    Pain Frequency  Intermittent    Aggravating Factors   movement at lumbar spine    Pain Relieving Factors  heat         [facility] PT Assessment - 03/04/18 1533      Assessment   Medical Diagnosis  lumbar radiculopathy    Referring Provider  Dr. Karlton Lemon    Onset Date/Surgical Date  -- ~1.5 weeks ago    Next MD Visit  -- mid April    Prior Therapy  no      Precautions   Precautions  None       Restrictions   Weight Bearing Restrictions  No      Balance Screen   Has the patient fallen in the past 6 months  No    Has the patient had a decrease in activity level because of a fear of falling?   No    Is the patient reluctant to leave their home because of a fear of falling?   No      Home Film/video editor residence      Prior Function   Level of Independence  Independent    Vocation  Full time employment    Vocation Requirements  desk work    Leisure  outdoors      Cognition   Overall Cognitive Status  Within Functional Limits for tasks assessed      Observation/Other Assessments   Focus on Therapeutic Outcomes (FOTO)   Lumbar: 54 (46% limited, predicted 27% limited)      Sensation  Light Touch  Impaired by gross assessment reduced sensation at R anterior/lateral thigh      Coordination   Gross Motor Movements are Fluid and Coordinated  Yes some pain limitations      Posture/Postural Control   Posture/Postural Control  No significant limitations      ROM / Strength   AROM / PROM / Strength  AROM;Strength      AROM   AROM Assessment Site  Lumbar    Lumbar Flexion  fingertips to toes - pain when returning to neutral    Lumbar Extension  75% limited - painful    Lumbar - Right Side Bend  25% limited - no pain    Lumbar - Left Side Bend  50% limited - painful    Lumbar - Right Rotation  25% limited - minor pain    Lumbar - Left Rotation  25% limited - minor pain      Strength   Strength Assessment Site  Hip;Knee    Right/Left Hip  Right;Left    Right Hip Flexion  5/5    Left Hip Flexion  4+/5    Left Hip ABduction  4-/5    Right/Left Knee  Right;Left    Right Knee Flexion  5/5    Right Knee Extension  5/5    Left Knee Flexion  4/5    Left Knee Extension  4+/5      Flexibility   Soft Tissue Assessment /Muscle Length  yes    Hamstrings  L mild tightness + pain in low back      Palpation   Palpation comment  TTP at L lower lumbar  paraspinals             Objective measurements completed on examination: See above findings.      [facility] Adult PT Treatment/Exercise - 03/04/18 1533      Exercises   Exercises  Lumbar      Lumbar Exercises: Stretches   Passive Hamstring Stretch  Left;3 reps;30 seconds    Press Ups  2 reps;10 seconds    Piriformis Stretch  Left;2 reps;30 seconds    Piriformis Stretch Limitations  KTOS      Lumbar Exercises: Standing   Functional Squats  10 reps      Lumbar Exercises: Supine   Bridge  10 reps      Lumbar Exercises: Sidelying   Hip Abduction  Left;10 reps      Lumbar Exercises: Quadruped   Madcat/Old Horse  5 reps    Other Quadruped Lumbar Exercises  childs pose 2 x 20 sec             PT Education - 03/04/18 1628    Education provided  Yes    Education Details  exam findings, POC, HEP    Person(s) Educated  Patient    Methods  Explanation;Demonstration;Handout    Comprehension  Verbalized understanding;Returned demonstration          PT Long Term Goals - 03/04/18 1640      PT LONG TERM GOAL #1   Title  patient to be independent with advanced HEP    Status  New    Target Date  04/15/18      PT LONG TERM GOAL #2   Title  patient to demosntrate lumbar AROM to WNL without pain    Status  New    Target Date  04/15/18      PT LONG TERM GOAL #3   Title  patient  to demonstrate appropriate posture and body mechanics as it relates to daily activities    Status  New    Target Date  04/15/18      PT LONG TERM GOAL #4   Title  patient to report ability to perform ADLs and household chores with pain no greater than 1/10    Status  New    Target Date  04/15/18             Plan - 03/04/18 1630    Clinical Impression Statement  [name] is a 67 y/o male presenting to OPPT today regarding primary complaints of L sided low back pain for ~1.5 weeks. Patient with limited AROM with extension and L lateral flexion + pain provocation. Also with  slight L proximal hip weakness and TTP at L lumbar paraspinals. Patient given initial HEP today for gentle stretching and strengthening with good tolerance and carryover. Patient to benefit from skilled PT intervention to address pain and functional mobility limitations.     Clinical Presentation  Stable    Clinical Decision Making  Low    Rehab Potential  Good    PT Frequency  2x / week    PT Duration  6 weeks    PT Treatment/Interventions  ADLs/Self Care Home Management;Cryotherapy;Electrical Stimulation;Traction;Moist Heat;Therapeutic exercise;Therapeutic activities;Functional mobility training;Neuromuscular re-education;Patient/family education;Manual techniques;Vasopneumatic Device;Taping;Dry needling;Passive range of motion    Consulted and Agree with Plan of Care  Patient       Patient will benefit from skilled therapeutic intervention in order to improve the following deficits and impairments:  Pain, Decreased strength, Decreased mobility, Decreased range of motion  Visit Diagnosis: Acute left-sided low back pain with left-sided sciatica  Cramp and spasm  Other symptoms and signs involving the musculoskeletal system     Problem List Patient Active Problem List   Diagnosis Date Noted  . Low back pain radiating to left leg 02/25/2018  . Right shoulder pain 11/19/2016  . PCP NOTES >>>> 11/04/2015  . Annual physical exam 07/05/2012  . SKIN LESIONS, MULTIPLE 10/05/2010  . COLONIC POLYPS 08/15/2007  . Hyperlipidemia 08/15/2007     Lanney Gins, PT, DPT 03/04/18 4:45 PM   [facility] [address]  [place], [place], [address] Phone: [phone]   Fax:  [phone]  Name: JATNIEL VERASTEGUI MRN: [id number] Date of Birth: 03-26-1951

## 2018-03-12 ENCOUNTER — Encounter: Payer: 59 | Admitting: Physical Therapy

## 2018-03-13 ENCOUNTER — Ambulatory Visit: Payer: 59

## 2018-03-13 DIAGNOSIS — R252 Cramp and spasm: Secondary | ICD-10-CM

## 2018-03-13 DIAGNOSIS — M5442 Lumbago with sciatica, left side: Secondary | ICD-10-CM | POA: Diagnosis not present

## 2018-03-13 DIAGNOSIS — R29898 Other symptoms and signs involving the musculoskeletal system: Secondary | ICD-10-CM

## 2018-03-13 NOTE — Therapy (Signed)
[facility] [address]  [place], [place], [address] Phone: [phone]   Fax:  [phone]  Physical Therapy Treatment  Patient Details  Name: Christopher Santos MRN: [id number] Date of Birth: 01-Feb-1951 Referring Provider: Dr. Karlton Lemon   Encounter Date: 03/13/2018  PT End of Session - 03/13/18 1536    Visit Number  2    Number of Visits  12    Date for PT Re-Evaluation  04/15/18    PT Start Time  1660    PT Stop Time  1615    PT Time Calculation (min)  44 min    Activity Tolerance  Patient tolerated treatment well    Behavior During Therapy  [facility] for tasks assessed/performed       Past Medical History:  Diagnosis Date  . Hyperlipemia     Past Surgical History:  Procedure Laterality Date  . TONSILLECTOMY     at age 67    There were no vitals filed for this visit.  Subjective Assessment - 03/13/18 1536    Subjective  Pt. reporting no issues with HEP.  Doing well today.  Pt. reporting he uses heating pad occasionally with good relief for back pain.      Pertinent History  n/a    Diagnostic tests  none    Patient Stated Goals  improve pain    Currently in Pain?  No/denies    Pain Score  0-No pain up to 2-3/10     Pain Location  Back    Pain Orientation  Left    Pain Descriptors / Indicators  Aching;Sore    Pain Type  Acute pain    Pain Radiating Towards  numbness into L anterior thigh     Pain Frequency  Intermittent    Aggravating Factors   Prolonged standing, prolonged sitting     Pain Relieving Factors  heat    Multiple Pain Sites  No                      [facility] Adult PT Treatment/Exercise - 03/13/18 1541      Lumbar Exercises: Stretches   Press Ups  2 reps;10 reps;5 seconds      Lumbar Exercises: Aerobic   Nustep  Lvl 5, 6 min       Lumbar Exercises: Standing   Functional Squats  3 seconds;15 reps    Functional Squats Limitations  counter    Other Standing Lumbar  Exercises  Side stepping with red looped TB at ankles 2 x 25 ft       Lumbar Exercises: Supine   Ab Set  10 reps;5 seconds    AB Set Limitations  Hooklying with sustained hip abd/ER with red looped TB at knees     Bridge  15 reps      Lumbar Exercises: Sidelying   Clam  10 reps;Left    Clam Limitations  red looped TB at knees       Lumbar Exercises: Prone   Other Prone Lumbar Exercises  --    Other Prone Lumbar Exercises  POE L lateral bending 5" x 5 reps       Lumbar Exercises: Quadruped   Madcat/Old Horse  10 reps      Manual Therapy   Manual Therapy  Passive ROM    Passive ROM  B HS, piriformis, SKTC stretch x 20 sec each side  PT Education - 03/13/18 1625    Education provided  Yes    Education Details  Side stepping with red looped TB at ankles     Person(s) Educated  Patient    Methods  Explanation;Demonstration;Verbal cues;Handout    Comprehension  Verbalized understanding;Returned demonstration;Verbal cues required;Need further instruction          PT Long Term Goals - 03/13/18 1536      PT LONG TERM GOAL #1   Title  patient to be independent with advanced HEP    Status  On-going      PT LONG TERM GOAL #2   Title  patient to demosntrate lumbar AROM to WNL without pain    Status  On-going      PT LONG TERM GOAL #3   Title  patient to demonstrate appropriate posture and body mechanics as it relates to daily activities    Status  On-going      PT LONG TERM GOAL #4   Title  patient to report ability to perform ADLs and household chores with pain no greater than 1/10    Status  On-going            Plan - 03/13/18 1538    Clinical Impression Statement  Pt. reporting he is performing HEP daily and only with question regarding need to perform SKTC stretch on both LE's.  Pt. enrouraged to perform LE stretching bilaterally.  Pt. tolerated progression of lumbopelvic strengthening focused on extension based activities well today.  HEP  updated.  Pt. with continued reports of L anterior thigh numbness which remained unchanged throughout treatment.  Pt. feels back pain is improving.  Able to perform full range on prone press up activity with mild LBP at end range today.  Will continue to progress toward goals in coming visits.      PT Treatment/Interventions  ADLs/Self Care Home Management;Cryotherapy;Electrical Stimulation;Traction;Moist Heat;Therapeutic exercise;Therapeutic activities;Functional mobility training;Neuromuscular re-education;Patient/family education;Manual techniques;Vasopneumatic Device;Taping;Dry needling;Passive range of motion    Consulted and Agree with Plan of Care  Patient       Patient will benefit from skilled therapeutic intervention in order to improve the following deficits and impairments:  Pain, Decreased strength, Decreased mobility, Decreased range of motion  Visit Diagnosis: Acute left-sided low back pain with left-sided sciatica  Cramp and spasm  Other symptoms and signs involving the musculoskeletal system     Problem List Patient Active Problem List   Diagnosis Date Noted  . Low back pain radiating to left leg 02/25/2018  . Right shoulder pain 11/19/2016  . PCP NOTES >>>> 11/04/2015  . Annual physical exam 07/05/2012  . SKIN LESIONS, MULTIPLE 10/05/2010  . COLONIC POLYPS 08/15/2007  . Hyperlipidemia 08/15/2007    Bess Harvest, PTA 03/13/18 4:33 PM  [facility] [address]  [place], [place], [address] Phone: [phone]   Fax:  [phone]  Name: NAREN BENALLY MRN: [id number] Date of Birth: 22-May-1951

## 2018-03-25 ENCOUNTER — Ambulatory Visit: Payer: 59 | Attending: Family Medicine

## 2018-03-25 DIAGNOSIS — R29898 Other symptoms and signs involving the musculoskeletal system: Secondary | ICD-10-CM | POA: Diagnosis present

## 2018-03-25 DIAGNOSIS — R252 Cramp and spasm: Secondary | ICD-10-CM | POA: Diagnosis present

## 2018-03-25 DIAGNOSIS — M5442 Lumbago with sciatica, left side: Secondary | ICD-10-CM

## 2018-03-25 NOTE — Therapy (Signed)
[facility] [address]  [place], [place], [address] Phone: [phone]   Fax:  [phone]  Physical Therapy Treatment  Patient Details  Name: Christopher Santos MRN: [id number] Date of Birth: 06-23-1951 Referring Provider: Dr. Karlton Lemon   Encounter Date: 03/25/2018  PT End of Session - 03/25/18 1548    Visit Number  3    Number of Visits  12    Date for PT Re-Evaluation  04/15/18    PT Start Time  1527    PT Stop Time  1614    PT Time Calculation (min)  47 min    Activity Tolerance  Patient tolerated treatment well    Behavior During Therapy  [facility] for tasks assessed/performed       Past Medical History:  Diagnosis Date  . Hyperlipemia     Past Surgical History:  Procedure Laterality Date  . TONSILLECTOMY     at age 37    There were no vitals filed for this visit.  Subjective Assessment - 03/25/18 1545    Subjective  Christopher Santos reporting improvement in back pain and decrease in radicular symptoms down L LE.      Pertinent History  n/a    Diagnostic tests  none    Patient Stated Goals  improve pain    Currently in Pain?  No/denies    Pain Score  0-No pain up to 3/10 at worst however pt. unsure with which activities     Multiple Pain Sites  No                       [facility] Adult PT Treatment/Exercise - 03/25/18 1540      Lumbar Exercises: Aerobic   Recumbent Bike  Lvl 2, 6 min       Lumbar Exercises: Standing   Other Standing Lumbar Exercises  Side stepping, monster walk forward/back with red looped TB at ankles 2 x 30 ft     Other Standing Lumbar Exercises  4-way hip kicker + abdom. bracing x 10 reps  2 ski poles       Lumbar Exercises: Supine   Bridge with clamshell  10 reps;5 seconds    Bridge with [employer] Limitations  With alternating hip abd/ER with red TB at knees x 1 each knee at top of movement     Isometric Hip Flexion  10 reps;5 seconds    Isometric Hip Flexion  Limitations  Alternating LE with opposite LE resting on table      Lumbar Exercises: Sidelying   Other Sidelying Lumbar Exercises  B open book stretch 3" x 10 reps each side       Lumbar Exercises: Prone   Other Prone Lumbar Exercises  Prone pressup x 10 reps to pt. tolerance some mild back pain at end range              PT Education - 03/25/18 1816    Education provided  Yes    Education Details  sidelying open book Ambulance person, monster walk     [employer]) Educated  Patient    Methods  Explanation;Demonstration;Verbal cues;Handout    Comprehension  Verbalized understanding;Returned demonstration;Verbal cues required;Need further instruction          PT Long Term Goals - 03/13/18 1536      PT LONG TERM GOAL #1   Title  patient to be independent with advanced HEP    Status  On-going  PT LONG TERM GOAL #2   Title  patient to demosntrate lumbar AROM to WNL without pain    Status  On-going      PT LONG TERM GOAL #3   Title  patient to demonstrate appropriate posture and body mechanics as it relates to daily activities    Status  On-going      PT LONG TERM GOAL #4   Title  patient to report ability to perform ADLs and household chores with pain no greater than 1/10    Status  On-going            Plan - 03/25/18 1603    Clinical Impression Statement  Pt. reporting improvement in LBP and decrease in L radicular symptoms since starting therapy.  Tolerated progression of lumbopelvic strengthening well today.  Verbalized that he may be interested in decreasing frequency of PT at end of visit today and encouraged to return for at least one more visit to therapy for goal testing and assessment by PT.  Progressing well.      PT Treatment/Interventions  ADLs/Self Care Home Management;Cryotherapy;Electrical Stimulation;Traction;Moist Heat;Therapeutic exercise;Therapeutic activities;Functional mobility training;Neuromuscular re-education;Patient/family education;Manual  techniques;Vasopneumatic Device;Taping;Dry needling;Passive range of motion    Consulted and Agree with Plan of Care  Patient       Patient will benefit from skilled therapeutic intervention in order to improve the following deficits and impairments:  Pain, Decreased strength, Decreased mobility, Decreased range of motion  Visit Diagnosis: Acute left-sided low back pain with left-sided sciatica  Cramp and spasm  Other symptoms and signs involving the musculoskeletal system     Problem List Patient Active Problem List   Diagnosis Date Noted  . Low back pain radiating to left leg 02/25/2018  . Right shoulder pain 11/19/2016  . PCP NOTES >>>> 11/04/2015  . Annual physical exam 07/05/2012  . SKIN LESIONS, MULTIPLE 10/05/2010  . COLONIC POLYPS 08/15/2007  . Hyperlipidemia 08/15/2007    Bess Harvest, PTA 03/25/18 6:20 PM  [facility] [address]  [place], [place], [address] Phone: [phone]   Fax:  [phone]  Name: Christopher Santos MRN: [id number] Date of Birth: 17-Oct-1951

## 2018-04-02 ENCOUNTER — Ambulatory Visit: Payer: 59

## 2018-04-02 DIAGNOSIS — R29898 Other symptoms and signs involving the musculoskeletal system: Secondary | ICD-10-CM

## 2018-04-02 DIAGNOSIS — M5442 Lumbago with sciatica, left side: Secondary | ICD-10-CM

## 2018-04-02 DIAGNOSIS — R252 Cramp and spasm: Secondary | ICD-10-CM

## 2018-04-02 NOTE — Therapy (Addendum)
[facility] [address]  [place], [place], [address] Phone: [phone]   Fax:  [phone]  Physical Therapy Treatment  Patient Details  Name: Christopher Santos MRN: [id number] Date of Birth: [birth year] Referring Provider: Dr. Karlton Lemon   Encounter Date: 04/02/2018  PT End of Session - 04/02/18 1453    Visit Number  4    Number of Visits  12    Date for PT Re-Evaluation  04/15/18    PT Start Time  1448    PT Stop Time  1530    PT Time Calculation (min)  42 min    Activity Tolerance  Patient tolerated treatment well    Behavior During Therapy  [facility] for tasks assessed/performed       Past Medical History:  Diagnosis Date  . Hyperlipemia     Past Surgical History:  Procedure Laterality Date  . TONSILLECTOMY     at age 67    There were no vitals filed for this visit.  Subjective Assessment - 04/02/18 1452    Subjective  Christopher Santos reporting he continues to have numbness in "front of thigh" which he states has improved however feels is his main concern.  Has felt good improvement in LBP since starting therapy.       Pertinent History  n/a    Patient Stated Goals  improve pain    Currently in Pain?  No/denies    Pain Score  0-No pain LBP up to 3/10 when he "gets in car wrong"    Pain Location  Back    Pain Descriptors / Indicators  Aching;Sore    Pain Type  Acute pain    Pain Radiating Towards  numbness into L anterior thigh     Pain Frequency  Intermittent    Aggravating Factors   getting in car "wrong",     Multiple Pain Sites  No         [facility] PT Assessment - 04/02/18 1505      AROM   AROM Assessment Site  Lumbar    Lumbar Flexion  fingertips to toes  - pain free    Lumbar Extension  50% limited - pain free    Lumbar - Right Side Bend  to knee joint line     Lumbar - Left Side Bend  to knee joint line     Lumbar - Right Rotation  WFL - mild R-sided LNP    Lumbar - Left Rotation  [facility]                     [facility] Adult PT Treatment/Exercise - 04/02/18 1456      Lumbar Exercises: Stretches   Single Knee to Chest Stretch  Right;Left;1 rep;20 seconds      Lumbar Exercises: Supine   Ab Set  10 reps;5 seconds    AB Set Limitations  Hooklying with sustained hip abd/ER with red looped TB at knees     Dead Bug  10 reps;3 seconds    Isometric Hip Flexion  5 seconds;15 reps    Isometric Hip Flexion Limitations  Alternating LE with opposite LE resting on table      Lumbar Exercises: Prone   Other Prone Lumbar Exercises  Prone pressup x 10 reps to pt. tolerance pushing into mild discomfort at top of movement       Lumbar Exercises: Quadruped   Madcat/Old Horse  10 reps    Opposite Arm/Leg  Raise  Right arm/Left leg;Left arm/Right leg;10 reps;3 seconds      Manual Therapy   Manual Therapy  Soft tissue mobilization    Soft tissue mobilization  STM to L-sided lumbar paraspinals over area of tenderness                   PT Long Term Goals - 04/02/18 1522      PT LONG TERM GOAL #1   Title  patient to be independent with advanced HEP    Status  Partially Met met for current HEP       PT LONG TERM GOAL #2   Title  patient to demosntrate lumbar AROM to WNL without pain    Status  Partially Met limited still in lumbar extension and mild pain on R rotation however ROM WFL       PT LONG TERM GOAL #3   Title  patient to demonstrate appropriate posture and body mechanics as it relates to daily activities    Status  On-going      PT LONG TERM GOAL #4   Title  patient to report ability to perform ADLs and household chores with pain no greater than 1/10    Status  Achieved            Plan - 04/02/18 1454    Clinical Impression Statement  Christopher Santos reporting his main concern is lingering anterior thigh numbness however, he has seen improvement in this and near complete relief in his back pain since starting therapy.  Reports performing HEP consistently.  Tolerated  all lumbopelvic strengthening well today.  Verbalized that he wishes to see MD before scheduling further therapy visits due to high copay.  Able to demo improved lumbar AROM today and reports he is able to perform daily activities and chores with pain no greater than 1/10.  Will continue to progress toward goals per pt. in coming visits.      PT Treatment/Interventions  ADLs/Self Care Home Management;Cryotherapy;Electrical Stimulation;Traction;Moist Heat;Therapeutic exercise;Therapeutic activities;Functional mobility training;Neuromuscular re-education;Patient/family education;Manual techniques;Vasopneumatic Device;Taping;Dry needling;Passive range of motion    Consulted and Agree with Plan of Care  Patient       Patient will benefit from skilled therapeutic intervention in order to improve the following deficits and impairments:  Pain, Decreased strength, Decreased mobility, Decreased range of motion  Visit Diagnosis: Acute left-sided low back pain with left-sided sciatica  Cramp and spasm  Other symptoms and signs involving the musculoskeletal system     Problem List Patient Active Problem List   Diagnosis Date Noted  . Low back pain radiating to left leg 02/25/2018  . Right shoulder pain 11/19/2016  . PCP NOTES >>>> 11/04/2015  . Annual physical exam 07/05/2012  . SKIN LESIONS, MULTIPLE 10/05/2010  . COLONIC POLYPS 08/15/2007  . Hyperlipidemia 08/15/2007    Bess Harvest, PTA 04/02/18 6:14 PM  PHYSICAL THERAPY DISCHARGE SUMMARY  Visits from Start of Care: 4  Current functional level related to goals / functional outcomes: See above   Remaining deficits: See above - seen by MD - reporting 90% improved   Education / Equipment: HEP  Plan: Patient agrees to discharge.  Patient goals were partially met. Patient is being discharged due to being pleased with the current functional level.  ?????     Lanney Gins, PT, DPT 05/09/18 8:59 AM   [facility] [place], [place], [address] Phone: [phone]   Fax:  [phone]  Name: Christopher Santos MRN: [id number] Date of Birth: May 02, 1951

## 2018-04-09 ENCOUNTER — Ambulatory Visit: Payer: 59 | Admitting: Family Medicine

## 2018-04-09 DIAGNOSIS — M545 Low back pain: Secondary | ICD-10-CM

## 2018-04-09 DIAGNOSIS — M79605 Pain in left leg: Secondary | ICD-10-CM

## 2018-04-09 NOTE — Patient Instructions (Signed)
Do the home exercises 3 times a week for 6 more weeks. Take the naproxen if needed for pain and inflammation, not every day now. Only take the flexeril if the naproxen isn't helping enough. Follow up with me as needed.

## 2018-04-10 ENCOUNTER — Encounter: Payer: Self-pay | Admitting: Family Medicine

## 2018-04-10 NOTE — Progress Notes (Signed)
PCP: Colon Branch, MD  Subjective:   HPI: Patient is a 67 y.o. male here for low back pain.  3/4: Patient reports about 1 week ago [place]  Shortly after this also took a wrong step and felt a sharp pain in left side of low back radiating into his left leg to just above the knee. Associated numbness in same distribution to anterior left thigh. No bowel/bladder dysfunction. Currently taking medrol dose pack, flexeril as needed. Took hydrocodone as needed. Pain level 7/10, sharp but improved from Thursday when this was at its worst.  4/16: Patient reports he's doing well. Feels about 90% improved. Doing physical therapy and home exercises. Taking naproxen and flexeril. Having numbness anterior left thigh and lateral thigh. Pain level 0/10. No skin changes. No bowel/bladder dysfunction.  Past Medical History:  Diagnosis Date  . Hyperlipemia     Current Outpatient Medications on File Prior to Visit  Medication Sig Dispense Refill  . aspirin 81 MG tablet Take 81 mg by mouth daily.    . cyclobenzaprine (FLEXERIL) 10 MG tablet Take 1 tablet (10 mg total) by mouth 3 (three) times daily as needed for muscle spasms. 60 tablet 1  . methylPREDNISolone (MEDROL DOSEPAK) 4 MG TBPK tablet     . naproxen (NAPROSYN) 500 MG tablet Take 1 tablet (500 mg total) by mouth 2 (two) times daily as needed. 60 tablet 2  . simvastatin (ZOCOR) 40 MG tablet TAKE 1 TABLET (40 MG TOTAL) BY MOUTH AT BEDTIME. 90 tablet 3  . valACYclovir (VALTREX) 1000 MG tablet Take 1 tablet (1,000 mg total) 2 (two) times daily by mouth. 20 tablet 0   No current facility-administered medications on file prior to visit.     Past Surgical History:  Procedure Laterality Date  . TONSILLECTOMY     at age 13    No Known Allergies  Social History   Socioeconomic History  . Marital status: Married    Spouse name: Not on file  . Number of children: 0  . Years of education: Not on file  .  Highest education level: Not on file  Occupational History  . 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 Financial resource strain: Not on file  . Food insecurity:    Worry: Not on file    Inability: Not on file  . Transportation needs:    Medical: Not on file    Non-medical: Not on file  Tobacco Use  . Smoking status: Former Research scientist (life sciences)  . Smokeless tobacco: Never Used  . Tobacco comment: quit 1993, no heavy use   Substance and Sexual Activity  . Alcohol use: Yes    Alcohol/week: 1.8 oz    Types: 3 Cans of beer per week    Comment: wine  . Drug use: No  . Sexual activity: Not on file  Lifestyle  . Physical activity:    Days per week: Not on file    Minutes per session: Not on file  . Stress: Not on file  Relationships  . Social connections:    Talks on phone: Not on file    Gets together: Not on file    Attends religious service: Not on file    Active member of club or organization: Not on file    Attends meetings of clubs or organizations: Not on file    Relationship status: Not on file  . Intimate partner violence:    Fear  of current or ex partner: Not on file    Emotionally abused: Not on file    Physically abused: Not on file    Forced sexual activity: Not on file  Other Topics Concern  . Not on file  Social History Narrative   Married, no children, original from [place], works for [employer] (doing the same he did at [employer])     Family History  Problem Relation Age of Onset  . CAD Father 67       MI at 49  . Diabetes Neg Hx   . Colon cancer Neg Hx   . Prostate cancer Neg Hx     BP 135/84   Pulse 87   Ht 6\' 3"  (1.905 m)   Wt 200 lb (90.7 kg)   BMI 25.00 kg/m   Review of Systems: See HPI above.     Objective:  Physical Exam:  Gen: NAD, comfortable in exam room  Back: No gross deformity, scoliosis. No TTP .  No midline or bony TTP. FROM. Strength LEs 5/5 all muscle groups.   2+ MSRs in patellar and achilles tendons, equal  bilaterally. Negative SLRs. Sensation intact to light touch bilaterally. Negative logroll bilateral hips  Assessment & Plan:  1. Low back pain with radiation into left leg - 2/2 lumbar radiculopathy, likely L4.  Much improved following prednisone dose pack, PT.  Do home exercises for 6 more weeks.  Naproxen and flexeril only if needed.  F/u prn.

## 2018-04-10 NOTE — Assessment & Plan Note (Signed)
2/2 lumbar radiculopathy, likely L4.  Much improved following prednisone dose pack, PT.  Do home exercises for 6 more weeks.  Naproxen and flexeril only if needed.  F/u prn.

## 2018-10-23 ENCOUNTER — Encounter: Payer: Self-pay | Admitting: Internal Medicine

## 2018-11-15 ENCOUNTER — Encounter: Payer: Self-pay | Admitting: Internal Medicine

## 2018-11-15 ENCOUNTER — Ambulatory Visit (INDEPENDENT_AMBULATORY_CARE_PROVIDER_SITE_OTHER): Payer: 59 | Admitting: Internal Medicine

## 2018-11-15 VITALS — BP 124/66 | HR 55 | Temp 97.8°F | Resp 16 | Ht 75.0 in | Wt 207.4 lb

## 2018-11-15 DIAGNOSIS — Z0000 Encounter for general adult medical examination without abnormal findings: Secondary | ICD-10-CM

## 2018-11-15 DIAGNOSIS — Z23 Encounter for immunization: Secondary | ICD-10-CM

## 2018-11-15 LAB — CBC WITH DIFFERENTIAL/PLATELET
Basophils Absolute: 0 10*3/uL (ref 0.0–0.1)
Basophils Relative: 0.8 % (ref 0.0–3.0)
EOS PCT: 4.8 % (ref 0.0–5.0)
Eosinophils Absolute: 0.2 10*3/uL (ref 0.0–0.7)
HCT: 43 % (ref 39.0–52.0)
Hemoglobin: 14.9 g/dL (ref 13.0–17.0)
LYMPHS ABS: 1.3 10*3/uL (ref 0.7–4.0)
Lymphocytes Relative: 32.2 % (ref 12.0–46.0)
MCHC: 34.6 g/dL (ref 30.0–36.0)
MCV: 97.4 fl (ref 78.0–100.0)
MONO ABS: 0.7 10*3/uL (ref 0.1–1.0)
Monocytes Relative: 16.8 % — ABNORMAL HIGH (ref 3.0–12.0)
NEUTROS PCT: 45.4 % (ref 43.0–77.0)
Neutro Abs: 1.8 10*3/uL (ref 1.4–7.7)
Platelets: 252 10*3/uL (ref 150.0–400.0)
RBC: 4.41 Mil/uL (ref 4.22–5.81)
RDW: 13 % (ref 11.5–15.5)
WBC: 3.9 10*3/uL — ABNORMAL LOW (ref 4.0–10.5)

## 2018-11-15 LAB — LIPID PANEL
Cholesterol: 156 mg/dL (ref 0–200)
HDL: 34.8 mg/dL — AB (ref >39.00)
LDL CALC: 101 mg/dL — AB (ref 0–99)
NonHDL: 121.28
Total CHOL/HDL Ratio: 4
Triglycerides: 103 mg/dL (ref 0.0–149.0)
VLDL: 20.6 mg/dL (ref 0.0–40.0)

## 2018-11-15 LAB — COMPREHENSIVE METABOLIC PANEL
ALK PHOS: 63 U/L (ref 39–117)
ALT: 16 U/L (ref 0–53)
AST: 18 U/L (ref 0–37)
Albumin: 4.5 g/dL (ref 3.5–5.2)
BILIRUBIN TOTAL: 0.7 mg/dL (ref 0.2–1.2)
BUN: 11 mg/dL (ref 6–23)
CO2: 31 mEq/L (ref 19–32)
CREATININE: 0.91 mg/dL (ref 0.40–1.50)
Calcium: 9.6 mg/dL (ref 8.4–10.5)
Chloride: 105 mEq/L (ref 96–112)
GFR: 88.69 mL/min (ref >60.00)
GLUCOSE: 103 mg/dL — AB (ref 70–99)
POTASSIUM: 4.8 meq/L (ref 3.5–5.1)
SODIUM: 142 meq/L (ref 135–145)
TOTAL PROTEIN: 6.7 g/dL (ref 6.0–8.3)

## 2018-11-15 LAB — TSH: TSH: 2.01 u[IU]/mL (ref 0.35–4.50)

## 2018-11-15 NOTE — Patient Instructions (Signed)
GO TO THE LAB : Get the blood work     GO TO THE FRONT DESK  Schedule your [facility] booster 2-3 months from today (nurse visit)  Schedule your next appointment for a  Physical exam in 1 year

## 2018-11-15 NOTE — Assessment & Plan Note (Addendum)
-  Tdap 2013;  zostavax: 2014;  Flu shot: declined ; shingrix #1 today, next in 2-3 months;  prevnar today. -CCS: colonoscopy in July 2004 -- bx- tubular adenomas and hyperplastic polyps. colonoscopy 01/2008 colon two polyps, tics, hemorrhoids.   Cscope 09-2013, due, just got a letter, plans to proceed in the next few month -Prostate cancer screening: DRE - PSA wnl 2018 -Diet and exercise discussed.  -Labs:  cmp flp cbc tsh

## 2018-11-15 NOTE — Progress Notes (Signed)
Pre visit review using our clinic review tool, if applicable. No additional management support is needed unless otherwise documented below in the visit note. 

## 2018-11-15 NOTE — Progress Notes (Signed)
Subjective:    Patient ID: Christopher Santos, male    DOB: [birth year], 67 y.o.   MRN: [id number]  DOS:  11/15/2018 Type of visit - description : cpx Doing well, no concerns   Review of Systems Had back pain, that is resolved, but still has some numbness at the left anterior thigh.  Other than above, a 14 point review of systems is negative    Past Medical History:  Diagnosis Date  . Hyperlipemia     Past Surgical History:  Procedure Laterality Date  . TONSILLECTOMY     at age 45    Social History   Socioeconomic History  . Marital status: Married    Spouse name: Not on file  . Number of children: 0  . Years of education: Not on file  . Highest education level: Not on file  Occupational History  . 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 Financial resource strain: Not on file  . Food insecurity:    Worry: Not on file    Inability: Not on file  . Transportation needs:    Medical: Not on file    Non-medical: Not on file  Tobacco Use  . Smoking status: Former Smoker    Last attempt to quit: 1993    Years since quitting: 26.9  . Smokeless tobacco: Never Used  . Tobacco comment: quit 1993, no heavy use   Substance and Sexual Activity  . Alcohol use: Yes    Alcohol/week: 3.0 standard drinks    Types: 3 Cans of beer per week    Comment: wine  . Drug use: No  . Sexual activity: Not on file  Lifestyle  . Physical activity:    Days per week: Not on file    Minutes per session: Not on file  . Stress: Not on file  Relationships  . Social connections:    Talks on phone: Not on file    Gets together: Not on file    Attends religious service: Not on file    Active member of club or organization: Not on file    Attends meetings of clubs or organizations: Not on file    Relationship status: Not on file  . Intimate partner violence:    Fear of current or ex partner: Not on file    Emotionally abused: Not on file    Physically abused: Not on file      Forced sexual activity: Not on file  Other Topics Concern  . Not on file  Social History Narrative   Married, no children, original from [place], works for [employer] (doing the same he did at [employer])    Family History  Problem Relation Age of Onset  . CAD Father 79       MI at 50  . Diabetes Neg Hx   . Colon cancer Neg Hx   . Prostate cancer Neg Hx      Allergies as of 11/15/2018   No Known Allergies     Medication List        Accurate as of 11/15/18 11:59 PM. Always use your most recent med list.          aspirin 81 MG tablet Take 81 mg by mouth daily.   simvastatin 40 MG tablet Commonly known as:  ZOCOR TAKE 1 TABLET (40 MG TOTAL) BY MOUTH AT BEDTIME.   valACYclovir 1000 MG tablet Commonly known as:  VALTREX Take 1 tablet (1,000 mg  total) 2 (two) times daily by mouth.           Objective:   Physical Exam BP 124/66 (BP Location: Left Arm, Patient Position: Sitting, Cuff Size: Small)   Pulse (!) 55   Temp 97.8 F (36.6 C) (Oral)   Resp 16   Ht 6\' 3"  (1.905 m)   Wt 207 lb 6 oz (94.1 kg)   SpO2 96%   BMI 25.92 kg/m  General: Well developed, NAD, BMI noted Neck: No  thyromegaly  HEENT:  Normocephalic . Face symmetric, atraumatic Lungs:  CTA B Normal respiratory effort, no intercostal retractions, no accessory muscle use. Heart: RRR,  no murmur.  No pretibial edema bilaterally  Abdomen:  Not distended, soft, non-tender. No rebound or rigidity.   Skin: Exposed areas without rash. Not pale. Not jaundice Neurologic:  alert & oriented X3.  Speech normal, gait appropriate for age and unassisted Strength symmetric and appropriate for age.  Psych: Cognition and judgment appear intact.  Cooperative with normal attention span and concentration.  Behavior appropriate. No anxious or depressed appearing.     Assessment & Plan:    Assessment > Dyslipidemia L wrist Fx 2015   PLAN: Hyperlipidemia: On simvastatin, checking labs RTC 1 year

## 2018-11-16 NOTE — Assessment & Plan Note (Signed)
Hyperlipidemia: On simvastatin, checking labs RTC 1 year

## 2018-11-19 ENCOUNTER — Other Ambulatory Visit: Payer: Self-pay

## 2018-11-19 MED ORDER — SIMVASTATIN 40 MG PO TABS: 40.0000 mg | ORAL_TABLET | Freq: Every day | ORAL | 3 refills | Status: DC

## 2019-02-11 ENCOUNTER — Ambulatory Visit (INDEPENDENT_AMBULATORY_CARE_PROVIDER_SITE_OTHER): Payer: 59 | Admitting: *Deleted

## 2019-02-11 DIAGNOSIS — Z23 Encounter for immunization: Secondary | ICD-10-CM | POA: Diagnosis not present

## 2019-02-18 ENCOUNTER — Ambulatory Visit: Payer: 59

## 2019-02-19 ENCOUNTER — Ambulatory Visit: Payer: 59

## 2019-11-24 ENCOUNTER — Encounter: Payer: Self-pay | Admitting: Internal Medicine

## 2019-11-24 ENCOUNTER — Ambulatory Visit (INDEPENDENT_AMBULATORY_CARE_PROVIDER_SITE_OTHER): Payer: 59 | Admitting: Internal Medicine

## 2019-11-24 ENCOUNTER — Other Ambulatory Visit: Payer: Self-pay

## 2019-11-24 VITALS — BP 133/70 | HR 54 | Temp 97.0°F | Resp 16 | Ht 75.0 in | Wt 204.2 lb

## 2019-11-24 DIAGNOSIS — Z125 Encounter for screening for malignant neoplasm of prostate: Secondary | ICD-10-CM | POA: Diagnosis not present

## 2019-11-24 DIAGNOSIS — Z0000 Encounter for general adult medical examination without abnormal findings: Secondary | ICD-10-CM | POA: Diagnosis not present

## 2019-11-24 DIAGNOSIS — Z23 Encounter for immunization: Secondary | ICD-10-CM | POA: Diagnosis not present

## 2019-11-24 DIAGNOSIS — E785 Hyperlipidemia, unspecified: Secondary | ICD-10-CM | POA: Diagnosis not present

## 2019-11-24 LAB — COMPREHENSIVE METABOLIC PANEL
ALT: 17 U/L (ref 0–53)
AST: 19 U/L (ref 0–37)
Albumin: 4.1 g/dL (ref 3.5–5.2)
Alkaline Phosphatase: 60 U/L (ref 39–117)
BUN: 12 mg/dL (ref 6–23)
CO2: 25 mEq/L (ref 19–32)
Calcium: 9.3 mg/dL (ref 8.4–10.5)
Chloride: 106 mEq/L (ref 96–112)
Creatinine, Ser: 0.86 mg/dL (ref 0.40–1.50)
GFR: 88.78 mL/min (ref >60.00)
Glucose, Bld: 100 mg/dL — ABNORMAL HIGH (ref 70–99)
Potassium: 4.7 mEq/L (ref 3.5–5.1)
Sodium: 140 mEq/L (ref 135–145)
Total Bilirubin: 0.7 mg/dL (ref 0.2–1.2)
Total Protein: 6.6 g/dL (ref 6.0–8.3)

## 2019-11-24 LAB — CBC WITH DIFFERENTIAL/PLATELET
Basophils Absolute: 0 10*3/uL (ref 0.0–0.1)
Basophils Relative: 0.8 % (ref 0.0–3.0)
Eosinophils Absolute: 0.3 10*3/uL (ref 0.0–0.7)
Eosinophils Relative: 5.7 % — ABNORMAL HIGH (ref 0.0–5.0)
HCT: 42.7 % (ref 39.0–52.0)
Hemoglobin: 14.6 g/dL (ref 13.0–17.0)
Lymphocytes Relative: 30.3 % (ref 12.0–46.0)
Lymphs Abs: 1.7 10*3/uL (ref 0.7–4.0)
MCHC: 34.2 g/dL (ref 30.0–36.0)
MCV: 99.8 fl (ref 78.0–100.0)
Monocytes Absolute: 0.7 10*3/uL (ref 0.1–1.0)
Monocytes Relative: 12.5 % — ABNORMAL HIGH (ref 3.0–12.0)
Neutro Abs: 2.9 10*3/uL (ref 1.4–7.7)
Neutrophils Relative %: 50.7 % (ref 43.0–77.0)
Platelets: 249 10*3/uL (ref 150.0–400.0)
RBC: 4.28 Mil/uL (ref 4.22–5.81)
RDW: 12.9 % (ref 11.5–15.5)
WBC: 5.8 10*3/uL (ref 4.0–10.5)

## 2019-11-24 LAB — LIPID PANEL
Cholesterol: 153 mg/dL (ref 0–200)
HDL: 40.8 mg/dL (ref >39.00)
LDL Cholesterol: 97 mg/dL (ref 0–99)
NonHDL: 112.4
Total CHOL/HDL Ratio: 4
Triglycerides: 79 mg/dL (ref 0.0–149.0)
VLDL: 15.8 mg/dL (ref 0.0–40.0)

## 2019-11-24 LAB — PSA: PSA: 0.76 ng/mL (ref 0.10–4.00)

## 2019-11-24 LAB — HEMOGLOBIN A1C: Hgb A1c MFr Bld: 5.4 % (ref 4.6–6.5)

## 2019-11-24 NOTE — Progress Notes (Signed)
Subjective:    Patient ID: Christopher Santos, male    DOB: [birth year], 68 y.o.   MRN: [id number]  DOS:  11/24/2019 Type of visit - description: CPX He really has no concerns, feeling well.  Review of Systems  A 14 point review of systems is negative    Past Medical History:  Diagnosis Date  . Hyperlipemia     Past Surgical History:  Procedure Laterality Date  . TONSILLECTOMY     at age 28    Social History   Socioeconomic History  . Marital status: Married    Spouse name: Not on file  . Number of children: 0  . Years of education: Not on file  . Highest education level: Not on file  Occupational History  . 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 Financial resource strain: Not on file  . Food insecurity    Worry: Not on file    Inability: Not on file  . Transportation needs    Medical: Not on file    Non-medical: Not on file  Tobacco Use  . Smoking status: Former Smoker    Quit date: 1993    Years since quitting: 27.9  . Smokeless tobacco: Never Used  . Tobacco comment: quit 1993, no heavy use   Substance and Sexual Activity  . Alcohol use: Yes    Alcohol/week: 3.0 standard drinks    Types: 3 Cans of beer per week    Comment: wine  . Drug use: No  . Sexual activity: Not on file  Lifestyle  . Physical activity    Days per week: Not on file    Minutes per session: Not on file  . Stress: Not on file  Relationships  . Social Herbalist on phone: Not on file    Gets together: Not on file    Attends religious service: Not on file    Active member of club or organization: Not on file    Attends meetings of clubs or organizations: Not on file    Relationship status: Not on file  . Intimate partner violence    Fear of current or ex partner: Not on file    Emotionally abused: Not on file    Physically abused: Not on file    Forced sexual activity: Not on file  Other Topics Concern  . Not on file  Social History Narrative    Married, no children, original from [place], works for [employer] (doing the same he did at [employer])      Family History  Problem Relation Age of Onset  . CAD Father 56       MI at 42  . Diabetes Neg Hx   . Colon cancer Neg Hx   . Prostate cancer Neg Hx      Allergies as of 11/24/2019   No Known Allergies     Medication List       Accurate as of November 24, 2019 11:59 PM. If you have any questions, ask your nurse or doctor.        aspirin 81 MG tablet Take 81 mg by mouth daily.   simvastatin 40 MG tablet Commonly known as: ZOCOR Take 1 tablet (40 mg total) by mouth at bedtime.   valACYclovir 1000 MG tablet Commonly known as: VALTREX Take 1 tablet (1,000 mg total) 2 (two) times daily by mouth.           Objective:  Physical Exam BP 133/70 (BP Location: Left Arm, Patient Position: Sitting, Cuff Size: Normal)   Pulse (!) 54   Temp (!) 97 F (36.1 C) (Temporal)   Resp 16   Ht 6\' 3"  (1.905 m)   Wt 204 lb 4 oz (92.6 kg)   SpO2 99%   BMI 25.53 kg/m  General: Well developed, NAD, BMI noted Neck: No  thyromegaly  HEENT:  Normocephalic . Face symmetric, atraumatic Lungs:  CTA B Normal respiratory effort, no intercostal retractions, no accessory muscle use. Heart: RRR,  no murmur.  No pretibial edema bilaterally  Abdomen:  Not distended, soft, non-tender. No rebound or rigidity.   Skin: Exposed areas without rash. Not pale. Not jaundice DRE: Normal sphincter tone, brown stools, normal prostate Neurologic:  alert & oriented X3.  Speech normal, gait appropriate for age and unassisted Strength symmetric and appropriate for age.  Psych: Cognition and judgment appear intact.  Cooperative with normal attention span and concentration.  Behavior appropriate. No anxious or depressed appearing.     Assessment      Assessment > Dyslipidemia L wrist Fx 2015  Fever blisters- valtrex rarely  PLAN: Here for CPX Hyperlipidemia: On statins, checking labs RTC 1 year    This visit occurred during the SARS-CoV-2 public health emergency.  Safety protocols were in place, including screening questions prior to the visit, additional usage of staff PPE, and extensive cleaning of exam room while observing appropriate contact time as indicated for disinfecting solutions.

## 2019-11-24 NOTE — Progress Notes (Signed)
Pre visit review using our clinic review tool, if applicable. No additional management support is needed unless otherwise documented below in the visit note. 

## 2019-11-24 NOTE — Patient Instructions (Addendum)
GO TO THE LAB : Get the blood work     GO TO THE FRONT DESK Schedule your next appointment   for physical exam in 1 year  If you are not contacted by the gastroenterology group by January for your next colonoscopy please call my office

## 2019-11-24 NOTE — Assessment & Plan Note (Addendum)
-  Tdap 2013 -  zostavax: 2014  - s/p shingrix x 2 -  Prevnar: 2019 - PNM 23: 10-2019 - s/p flu shot 07/2019 -CCS: colonoscopy in July 2004 -- bx- tubular adenomas and hyperplastic polyps. colonoscopy 01/2008 colon two polyps, tics, hemorrhoids.   Cscope 09-2013, due, has not pursued due to quarantine, will refer him to GI 12-2019 -Prostate cancer screening: DRE normal, check a PSA -Diet and exercise discussed.  -Labs: CMP, FLP, CBC, A1c, PSA

## 2019-11-25 NOTE — Assessment & Plan Note (Addendum)
Here for CPX Hyperlipidemia: On statins, checking labs RTC 1 year

## 2019-11-27 MED ORDER — ATORVASTATIN CALCIUM 40 MG PO TABS: 40.0000 mg | ORAL_TABLET | Freq: Every day | ORAL | 0 refills | Status: DC

## 2019-11-27 NOTE — Addendum Note (Signed)
Addended byDamita Dunnings D on: 11/27/2019 07:43 AM   Modules accepted: Orders

## 2019-12-12 ENCOUNTER — Other Ambulatory Visit: Payer: Self-pay | Admitting: Internal Medicine

## 2019-12-12 NOTE — Telephone Encounter (Signed)
Change in therapy 11/27/19

## 2020-01-05 ENCOUNTER — Telehealth: Payer: Self-pay | Admitting: Internal Medicine

## 2020-01-05 DIAGNOSIS — Z1211 Encounter for screening for malignant neoplasm of colon: Secondary | ICD-10-CM

## 2020-01-05 NOTE — Telephone Encounter (Signed)
Please enter referral to GI, due for a colonoscopy.  Let him know about the referral, send him a message

## 2020-01-07 ENCOUNTER — Encounter: Payer: Self-pay | Admitting: Internal Medicine

## 2020-01-07 NOTE — Telephone Encounter (Signed)
Left message with wife and she will let him know that referral is being placed.

## 2020-01-24 ENCOUNTER — Ambulatory Visit: Payer: 59

## 2020-01-26 ENCOUNTER — Telehealth: Payer: Self-pay | Admitting: Internal Medicine

## 2020-01-26 ENCOUNTER — Other Ambulatory Visit: Payer: Self-pay

## 2020-01-26 ENCOUNTER — Inpatient Hospital Stay (HOSPITAL_COMMUNITY)
Admission: EM | Admit: 2020-01-26 | Discharge: 2020-01-31 | DRG: 177 | Disposition: A | Discharge status: Home / Self Care | Payer: 59 | Attending: Internal Medicine | Admitting: Internal Medicine

## 2020-01-26 ENCOUNTER — Emergency Department (HOSPITAL_COMMUNITY): Payer: 59

## 2020-01-26 ENCOUNTER — Encounter (HOSPITAL_COMMUNITY): Payer: Self-pay | Admitting: Emergency Medicine

## 2020-01-26 DIAGNOSIS — Z7982 Long term (current) use of aspirin: Secondary | ICD-10-CM | POA: Diagnosis not present

## 2020-01-26 DIAGNOSIS — J1282 Pneumonia due to coronavirus disease 2019: Secondary | ICD-10-CM | POA: Diagnosis not present

## 2020-01-26 DIAGNOSIS — U071 COVID-19: Principal | ICD-10-CM | POA: Diagnosis present

## 2020-01-26 DIAGNOSIS — R7401 Elevation of levels of liver transaminase levels: Secondary | ICD-10-CM | POA: Diagnosis present

## 2020-01-26 DIAGNOSIS — E872 Acidosis: Secondary | ICD-10-CM | POA: Diagnosis present

## 2020-01-26 DIAGNOSIS — Z79899 Other long term (current) drug therapy: Secondary | ICD-10-CM | POA: Diagnosis not present

## 2020-01-26 DIAGNOSIS — J9601 Acute respiratory failure with hypoxia: Secondary | ICD-10-CM | POA: Diagnosis present

## 2020-01-26 DIAGNOSIS — Z87891 Personal history of nicotine dependence: Secondary | ICD-10-CM | POA: Diagnosis not present

## 2020-01-26 DIAGNOSIS — E860 Dehydration: Secondary | ICD-10-CM | POA: Diagnosis present

## 2020-01-26 DIAGNOSIS — E785 Hyperlipidemia, unspecified: Secondary | ICD-10-CM | POA: Diagnosis not present

## 2020-01-26 DIAGNOSIS — Z8249 Family history of ischemic heart disease and other diseases of the circulatory system: Secondary | ICD-10-CM | POA: Diagnosis not present

## 2020-01-26 DIAGNOSIS — E782 Mixed hyperlipidemia: Secondary | ICD-10-CM | POA: Diagnosis present

## 2020-01-26 HISTORY — DX: COVID-19: U07.1

## 2020-01-26 LAB — COMPREHENSIVE METABOLIC PANEL
ALT: 47 U/L — ABNORMAL HIGH (ref 0–44)
AST: 57 U/L — ABNORMAL HIGH (ref 15–41)
Albumin: 3.3 g/dL — ABNORMAL LOW (ref 3.5–5.0)
Alkaline Phosphatase: 112 U/L (ref 38–126)
Anion gap: 13 (ref 5–15)
BUN: 26 mg/dL — ABNORMAL HIGH (ref 8–23)
CO2: 23 mmol/L (ref 22–32)
Calcium: 8.7 mg/dL — ABNORMAL LOW (ref 8.9–10.3)
Chloride: 99 mmol/L (ref 98–111)
Creatinine, Ser: 0.91 mg/dL (ref 0.61–1.24)
GFR calc Af Amer: >60 mL/min (ref >60)
GFR calc non Af Amer: >60 mL/min (ref >60)
Glucose, Bld: 164 mg/dL — ABNORMAL HIGH (ref 70–99)
Potassium: 3.3 mmol/L — ABNORMAL LOW (ref 3.5–5.1)
Sodium: 135 mmol/L (ref 135–145)
Total Bilirubin: 1 mg/dL (ref 0.3–1.2)
Total Protein: 7 g/dL (ref 6.5–8.1)

## 2020-01-26 LAB — CBC WITH DIFFERENTIAL/PLATELET
Abs Immature Granulocytes: 0.12 10*3/uL — ABNORMAL HIGH (ref 0.00–0.07)
Abs Immature Granulocytes: 0.13 10*3/uL — ABNORMAL HIGH (ref 0.00–0.07)
Basophils Absolute: 0 10*3/uL (ref 0.0–0.1)
Basophils Absolute: 0 10*3/uL (ref 0.0–0.1)
Basophils Relative: 0 %
Basophils Relative: 0 %
Eosinophils Absolute: 0 10*3/uL (ref 0.0–0.5)
Eosinophils Absolute: 0 10*3/uL (ref 0.0–0.5)
Eosinophils Relative: 0 %
Eosinophils Relative: 0 %
HCT: 40.4 % (ref 39.0–52.0)
HCT: 41.2 % (ref 39.0–52.0)
Hemoglobin: 14.2 g/dL (ref 13.0–17.0)
Hemoglobin: 14.6 g/dL (ref 13.0–17.0)
Immature Granulocytes: 1 %
Immature Granulocytes: 1 %
Lymphocytes Relative: 6 %
Lymphocytes Relative: 7 %
Lymphs Abs: 0.6 10*3/uL — ABNORMAL LOW (ref 0.7–4.0)
Lymphs Abs: 0.8 10*3/uL (ref 0.7–4.0)
MCH: 33.5 pg (ref 26.0–34.0)
MCH: 33.8 pg (ref 26.0–34.0)
MCHC: 35.1 g/dL (ref 30.0–36.0)
MCHC: 35.4 g/dL (ref 30.0–36.0)
MCV: 95.3 fL (ref 80.0–100.0)
MCV: 95.4 fL (ref 80.0–100.0)
Monocytes Absolute: 0.5 10*3/uL (ref 0.1–1.0)
Monocytes Absolute: 0.4 10*3/uL (ref 0.1–1.0)
Monocytes Relative: 5 %
Monocytes Relative: 3 %
Neutro Abs: 8.7 10*3/uL — ABNORMAL HIGH (ref 1.7–7.7)
Neutro Abs: 9.8 10*3/uL — ABNORMAL HIGH (ref 1.7–7.7)
Neutrophils Relative %: 88 %
Neutrophils Relative %: 89 %
Platelets: 250 10*3/uL (ref 150–400)
Platelets: 273 10*3/uL (ref 150–400)
RBC: 4.24 MIL/uL (ref 4.22–5.81)
RBC: 4.32 MIL/uL (ref 4.22–5.81)
RDW: 12.2 % (ref 11.5–15.5)
RDW: 12.4 % (ref 11.5–15.5)
WBC: 10 10*3/uL (ref 4.0–10.5)
WBC: 11.1 10*3/uL — ABNORMAL HIGH (ref 4.0–10.5)
nRBC: 0 % (ref 0.0–0.2)
nRBC: 0 % (ref 0.0–0.2)

## 2020-01-26 LAB — FERRITIN: Ferritin: 869 ng/mL — ABNORMAL HIGH (ref 24–336)

## 2020-01-26 LAB — LACTIC ACID, PLASMA
Lactic Acid, Venous: 2.1 mmol/L (ref 0.5–1.9)
Lactic Acid, Venous: 1.5 mmol/L (ref 0.5–1.9)

## 2020-01-26 LAB — C-REACTIVE PROTEIN: CRP: 22.8 mg/dL — ABNORMAL HIGH (ref <1.0)

## 2020-01-26 LAB — D-DIMER, QUANTITATIVE: D-Dimer, Quant: 1.57 ug/mL-FEU — ABNORMAL HIGH (ref 0.00–0.50)

## 2020-01-26 LAB — PROCALCITONIN: Procalcitonin: 0.22 ng/mL

## 2020-01-26 LAB — HIV ANTIBODY (ROUTINE TESTING W REFLEX): HIV Screen 4th Generation wRfx: NONREACTIVE

## 2020-01-26 LAB — LACTATE DEHYDROGENASE: LDH: 291 U/L — ABNORMAL HIGH (ref 98–192)

## 2020-01-26 LAB — PROTIME-INR
INR: 1.1 (ref 0.8–1.2)
Prothrombin Time: 14.1 seconds (ref 11.4–15.2)

## 2020-01-26 LAB — POC SARS CORONAVIRUS 2 AG -  ED: SARS Coronavirus 2 Ag: POSITIVE — AB

## 2020-01-26 LAB — ABO/RH: ABO/RH(D): O POS

## 2020-01-26 LAB — TRIGLYCERIDES: Triglycerides: 144 mg/dL (ref <150)

## 2020-01-26 LAB — FIBRINOGEN: Fibrinogen: >800 mg/dL — ABNORMAL HIGH (ref 210–475)

## 2020-01-26 MED ORDER — GUAIFENESIN-DM 100-10 MG/5ML PO SYRP: 10.0000 mL | ORAL_SOLUTION | ORAL | Status: DC | PRN

## 2020-01-26 MED ORDER — DEXAMETHASONE SODIUM PHOSPHATE 10 MG/ML IJ SOLN
6.0000 mg | Freq: Once | INTRAMUSCULAR | Status: AC
  Administered 2020-01-26: 11:00:00 6 mg via INTRAVENOUS
  Filled 2020-01-26: qty 1

## 2020-01-26 MED ORDER — ASPIRIN 81 MG PO CHEW
81.0000 mg | CHEWABLE_TABLET | Freq: Every day | ORAL | Status: DC
  Administered 2020-01-26 – 2020-01-30 (×5): 81 mg via ORAL
  Filled 2020-01-26 (×5): qty 1

## 2020-01-26 MED ORDER — DEXAMETHASONE SODIUM PHOSPHATE 10 MG/ML IJ SOLN
6.0000 mg | INTRAMUSCULAR | Status: DC
  Administered 2020-01-26 – 2020-01-30 (×5): 6 mg via INTRAVENOUS
  Filled 2020-01-26 (×5): qty 1

## 2020-01-26 MED ORDER — SODIUM CHLORIDE 0.9 % IV SOLN: INTRAVENOUS | Status: DC

## 2020-01-26 MED ORDER — SODIUM CHLORIDE 0.9 % IV BOLUS
500.0000 mL | Freq: Once | INTRAVENOUS | Status: AC
  Administered 2020-01-26: 500 mL via INTRAVENOUS

## 2020-01-26 MED ORDER — ACETAMINOPHEN 325 MG PO TABS
650.0000 mg | ORAL_TABLET | Freq: Four times a day (QID) | ORAL | Status: DC | PRN
  Administered 2020-01-26: 650 mg via ORAL
  Filled 2020-01-26: qty 2

## 2020-01-26 MED ORDER — ORAL CARE MOUTH RINSE
15.0000 mL | Freq: Two times a day (BID) | OROMUCOSAL | Status: DC
  Administered 2020-01-27 – 2020-01-31 (×8): 15 mL via OROMUCOSAL

## 2020-01-26 MED ORDER — SODIUM CHLORIDE 0.9 % IV SOLN
100.0000 mg | Freq: Every day | INTRAVENOUS | Status: AC
  Administered 2020-01-27 – 2020-01-30 (×4): 100 mg via INTRAVENOUS
  Filled 2020-01-26 (×4): qty 20

## 2020-01-26 MED ORDER — SODIUM CHLORIDE 0.9% IV SOLUTION: Freq: Once | INTRAVENOUS | Status: DC

## 2020-01-26 MED ORDER — POTASSIUM CHLORIDE CRYS ER 20 MEQ PO TBCR
40.0000 meq | EXTENDED_RELEASE_TABLET | Freq: Once | ORAL | Status: AC
  Administered 2020-01-26: 40 meq via ORAL
  Filled 2020-01-26: qty 2

## 2020-01-26 MED ORDER — ASCORBIC ACID 500 MG PO TABS
500.0000 mg | ORAL_TABLET | Freq: Every day | ORAL | Status: DC
  Administered 2020-01-26 – 2020-01-31 (×6): 500 mg via ORAL
  Filled 2020-01-26 (×6): qty 1

## 2020-01-26 MED ORDER — ATORVASTATIN CALCIUM 40 MG PO TABS
40.0000 mg | ORAL_TABLET | Freq: Every day | ORAL | Status: DC
  Administered 2020-01-26: 40 mg via ORAL
  Filled 2020-01-26 (×2): qty 1

## 2020-01-26 MED ORDER — TOCILIZUMAB 400 MG/20ML IV SOLN
800.0000 mg | Freq: Once | INTRAVENOUS | Status: AC
  Administered 2020-01-26: 800 mg via INTRAVENOUS
  Filled 2020-01-26: qty 40

## 2020-01-26 MED ORDER — ZINC SULFATE 220 (50 ZN) MG PO CAPS
220.0000 mg | ORAL_CAPSULE | Freq: Every day | ORAL | Status: DC
  Administered 2020-01-26 – 2020-01-31 (×6): 220 mg via ORAL
  Filled 2020-01-26 (×6): qty 1

## 2020-01-26 MED ORDER — SODIUM CHLORIDE 0.9 % IV SOLN
200.0000 mg | Freq: Once | INTRAVENOUS | Status: AC
  Administered 2020-01-26: 200 mg via INTRAVENOUS
  Filled 2020-01-26: qty 200

## 2020-01-26 MED ORDER — ENOXAPARIN SODIUM 40 MG/0.4ML ~~LOC~~ SOLN
40.0000 mg | SUBCUTANEOUS | Status: DC
  Administered 2020-01-26 – 2020-01-30 (×5): 40 mg via SUBCUTANEOUS
  Filled 2020-01-26 (×5): qty 0.4

## 2020-01-26 NOTE — Progress Notes (Signed)
Christopher Santos  [id number] DOB: 07-23-1951 DOA: 01/26/2020 PCP: Colon Branch, MD    Brief Narrative:  69 year old with a history of HLD who presented to the [facility], ED with complaints of generalized weakness, headache, shortness of breath, and chills for a week.  His sister and all have recently tested positive for Covid.  In the ED he was found to have a saturation of 80% on room air and required 4 L of oxygen support.  CXR noted a right sided pulmonary infiltrate.  He was dosed with Decadron, remdesivir, and Actemra while still in the ED.  Significant Events: 2/1 admit to [facility] via [place] ED  COVID-19 specific Treatment: Decadron 2/1 > Remdesivir 2/1 > Actemra 2/1 Convalescent plasma 2/1  Antimicrobials:  None  Subjective: Upon arrival at [facility] patient was reevaluated at the bedside by Dr. Sander Radon. He was requiring a higher level of O2 support than earlier in the day. Dr. Cathlean Sauer counseled the patient and he agreed to a Convalescent Plasma transfusion. Dr. Cathlean Sauer had him sign the consent, and ordered the product.   Assessment & Plan:  COVID Pneumonia - acute hypoxic respiratory failure  Recent Labs  Lab 01/26/20 1027  DDIMER 1.57*  FERRITIN 869*  CRP 22.8*  ALT 47*  PROCALCITON 0.22    Lactic acidosis  Hyperlipidemia  DVT prophylaxis: Lovenox Code Status: FULL CODE Family Communication:  Disposition Plan:   Consultants:  none  Objective: Blood pressure 136/76, pulse (!) 59, temperature 98.3 F (36.8 C), temperature source Oral, resp. rate 18, height 6\' 3"  (1.905 m), weight 90.7 kg, SpO2 96 %.  Intake/Output Summary (Last 24 hours) at 01/26/2020 1745 Last data filed at 01/26/2020 1539 Gross per 24 hour  Intake 850 ml  Output -  Net 850 ml   Filed Weights   01/26/20 1222  Weight: 90.7 kg    Examination:   CBC: Recent Labs  Lab 01/26/20 1027  WBC 10.0  NEUTROABS 8.7*  HGB 14.2  HCT 40.4  MCV 95.3  PLT [id number]    Basic Metabolic Panel: Recent Labs  Lab 01/26/20 1027  NA 135  K 3.3*  CL 99  CO2 23  GLUCOSE 164*  BUN 26*  CREATININE 0.91  CALCIUM 8.7*   GFR: Estimated Creatinine Clearance: 95.4 mL/min (by C-G formula based on SCr of 0.91 mg/dL).  Liver Function Tests: Recent Labs  Lab 01/26/20 1027  AST 57*  ALT 47*  ALKPHOS 112  BILITOT 1.0  PROT 7.0  ALBUMIN 3.3*    Coagulation Profile: Recent Labs  Lab 01/26/20 1027  INR 1.1   HbA1C: Hgb A1c MFr Bld  Date/Time Value Ref Range Status  11/24/2019 08:26 AM 5.4 4.6 - 6.5 % Final    Comment:    Glycemic Control Guidelines for People with Diabetes:Non Diabetic:  <6%Goal of Therapy: <7%Additional Action Suggested:  >8%      Scheduled Meds: . sodium chloride   Intravenous Once  . vitamin C  500 mg Oral Daily  . aspirin  81 mg Oral QHS  . atorvastatin  40 mg Oral QHS  . dexamethasone (DECADRON) injection  6 mg Intravenous Q24H  . enoxaparin (LOVENOX) injection  40 mg Subcutaneous Q24H  . mouth rinse  15 mL Mouth Rinse BID  . zinc sulfate  220 mg Oral Daily   Continuous Infusions: . sodium chloride 75 mL/hr at 01/26/20 1549  . [START ON 01/27/2020] remdesivir 100 mg in NS 100 mL  LOS: 0 days   Cherene Altes, MD [facility] Office  [phone] Pager - Text Page per Amion  If 7PM-7AM, please contact night-coverage per Amion 01/26/2020, 5:45 PM

## 2020-01-26 NOTE — Progress Notes (Signed)
Patient alert oriented, resting in room No acute changes in condition,  He is on 6L HF, stats mid 90's% Spoke with lab, patient has order for plasma but needs type and screen. Messaged provider on call.

## 2020-01-26 NOTE — Plan of Care (Signed)
  Problem: Education: Goal: Knowledge of risk factors and measures for prevention of condition will improve Outcome: Progressing   

## 2020-01-26 NOTE — ED Notes (Signed)
Patient states he does not have a urine sample yet, but has a urinal at bedside. Instructed patient to alert staff via   nurses button when a sample is available.

## 2020-01-26 NOTE — ED Notes (Signed)
Called [facility] to get an updated regarding an available truck to transport the pt; per [facility], a truck is en route.

## 2020-01-26 NOTE — Telephone Encounter (Signed)
FYI

## 2020-01-26 NOTE — Telephone Encounter (Signed)
Caller Name: Lelan Pons Phone: [phone]  Lelan Pons called nothing pt tested positive for COVID-19 and they got a pulse oximeter and were advised if O2 sats went into the 80s to call the PCP. Pt O2 sat this morning ranging from 74-82 this morning. At this time it is 48. Pulse is 86. No BP cuff at home. Per Vilma Prader advised Lelan Pons to call 911 as pt needs to be put on oxygen asap. Lelan Pons states she is hanging up and calling directly.

## 2020-01-26 NOTE — ED Triage Notes (Signed)
Tylenol taken around 9am today,unsure of dosage. Pt has cough that is productive, unsure what color phlegm.

## 2020-01-26 NOTE — ED Provider Notes (Signed)
[facility] Emergency Department Provider Note MRN:  [id number]  Arrival date & time: 01/26/20     Chief Complaint   Shortness of Breath   History of Present Illness   Christopher Santos is a 69 y.o. year-old male with a history of hyperlipidemia presenting to the ED with chief complaint of shortness of breath.  1 week of cough, intermittent dull frontal headache, shortness of breath, chest pain that occurs only with cough, general malaise, fatigue, poor appetite.  Felt more short of breath for the past 2 days, here for evaluation.  Review of Systems  A complete 10 system review of systems was obtained and all systems are negative except as noted in the HPI and PMH.   Patient's Health History    Past Medical History:  Diagnosis Date  . Hyperlipemia     Past Surgical History:  Procedure Laterality Date  . TONSILLECTOMY     at age 50    Family History  Problem Relation Age of Onset  . CAD Father 59       MI at 79  . Diabetes Neg Hx   . Colon cancer Neg Hx   . Prostate cancer Neg Hx     Social History   Socioeconomic History  . Marital status: Married    Spouse name: Not on file  . Number of children: 0  . Years of education: Not on file  . Highest education level: Not on file  Occupational History  . Occupation: IT , [employer] (used to be [employer])  Tobacco Use  . Smoking status: Former Smoker    Quit date: 1993    Years since quitting: 28.1  . Smokeless tobacco: Never Used  . Tobacco comment: quit 1993, no heavy use   Substance and Sexual Activity  . Alcohol use: Yes    Alcohol/week: 3.0 standard drinks    Types: 3 Cans of beer per week    Comment: wine  . Drug use: No  . Sexual activity: Not on file  Other Topics Concern  . Not on file  Social History Narrative   Married, no children, original from [place], works for [employer] (doing the same he did at [employer])    Social Determinants of Radio broadcast assistant Strain:   . Difficulty of  Paying Living Expenses: Not on file  Food Insecurity:   . Worried About Charity fundraiser in the Last Year: Not on file  . Ran Out of Food in the Last Year: Not on file  Transportation Needs:   . Lack of Transportation (Medical): Not on file  . Lack of Transportation (Non-Medical): Not on file  Physical Activity:   . Days of Exercise per Week: Not on file  . Minutes of Exercise per Session: Not on file  Stress:   . Feeling of Stress : Not on file  Social Connections:   . Frequency of Communication with Friends and Family: Not on file  . Frequency of Social Gatherings with Friends and Family: Not on file  . Attends Religious Services: Not on file  . Active Member of Clubs or Organizations: Not on file  . Attends Archivist Meetings: Not on file  . Marital Status: Not on file  Intimate Partner Violence:   . Fear of Current or Ex-Partner: Not on file  . Emotionally Abused: Not on file  . Physically Abused: Not on file  . Sexually Abused: Not on file     Physical Exam  Vitals:   01/26/20 1045 01/26/20 1100  BP:  (!) 150/80  Pulse: 66 68  Resp: 18   Temp:    SpO2: 93% 94%    CONSTITUTIONAL: Well-appearing, NAD NEURO:  Alert and oriented x 3, no focal deficits EYES:  eyes equal and reactive ENT/NECK:  no LAD, no JVD CARDIO: Regular rate, well-perfused, normal S1 and S2 PULM: Scattered rhonchi GI/GU:  normal bowel sounds, non-distended, non-tender MSK/SPINE:  No gross deformities, no edema SKIN:  no rash, atraumatic PSYCH:  Appropriate speech and behavior  *Additional and/or pertinent findings included in MDM below  Diagnostic and Interventional Summary    EKG Interpretation  Date/Time:  Monday January 26 2020 10:30:53 EST Ventricular Rate:  70 PR Interval:    QRS Duration: 108 QT Interval:  403 QTC Calculation: 435 R Axis:   55 Text Interpretation: Sinus rhythm Baseline wander in lead(s) V6 No previous ECGs available Confirmed by Gerlene Fee  [phone]) on 01/26/2020 12:40:20 PM      Cardiac Monitoring Interpretation:  Labs Reviewed  COMPREHENSIVE METABOLIC PANEL - Abnormal; Notable for the following components:      Result Value   Potassium 3.3 (*)    Glucose, Bld 164 (*)    BUN 26 (*)    Calcium 8.7 (*)    Albumin 3.3 (*)    AST 57 (*)    ALT 47 (*)    All other components within normal limits  LACTIC ACID, PLASMA - Abnormal; Notable for the following components:   Lactic Acid, Venous 2.1 (*)    All other components within normal limits  CBC WITH DIFFERENTIAL/PLATELET - Abnormal; Notable for the following components:   Neutro Abs 8.7 (*)    Lymphs Abs 0.6 (*)    Abs Immature Granulocytes 0.12 (*)    All other components within normal limits  D-DIMER, QUANTITATIVE (NOT AT [facility]) - Abnormal; Notable for the following components:   D-Dimer, Quant 1.57 (*)    All other components within normal limits  LACTATE DEHYDROGENASE - Abnormal; Notable for the following components:   LDH 291 (*)    All other components within normal limits  FERRITIN - Abnormal; Notable for the following components:   Ferritin 869 (*)    All other components within normal limits  FIBRINOGEN - Abnormal; Notable for the following components:   Fibrinogen >800 (*)    All other components within normal limits  C-REACTIVE PROTEIN - Abnormal; Notable for the following components:   CRP 22.8 (*)    All other components within normal limits  POC SARS CORONAVIRUS 2 AG -  ED - Abnormal; Notable for the following components:   SARS Coronavirus 2 Ag POSITIVE (*)    All other components within normal limits  CULTURE, BLOOD (ROUTINE X 2)  CULTURE, BLOOD (ROUTINE X 2)  PROTIME-INR  PROCALCITONIN  LACTIC ACID, PLASMA  URINALYSIS, ROUTINE W REFLEX MICROSCOPIC  TRIGLYCERIDES  HIV ANTIBODY (ROUTINE TESTING W REFLEX)  ABO/RH    DG Chest Portable 1 View  Final Result      Medications  0.9 %  sodium chloride infusion (has no administration in time range)   acetaminophen (TYLENOL) tablet 650 mg (has no administration in time range)  aspirin chewable tablet 81 mg (has no administration in time range)  atorvastatin (LIPITOR) tablet 40 mg (has no administration in time range)  enoxaparin (LOVENOX) injection 40 mg (has no administration in time range)  remdesivir 200 mg in sodium chloride 0.9% 250 mL IVPB (has no administration  in time range)    Followed by  remdesivir 100 mg in sodium chloride 0.9 % 100 mL IVPB (has no administration in time range)  tocilizumab (ACTEMRA) 800 mg in sodium chloride 0.9 % 100 mL infusion (has no administration in time range)  dexamethasone (DECADRON) injection 6 mg (has no administration in time range)  guaiFENesin-dextromethorphan (ROBITUSSIN DM) 100-10 MG/5ML syrup 10 mL (has no administration in time range)  ascorbic acid (VITAMIN C) tablet 500 mg (has no administration in time range)  zinc sulfate capsule 220 mg (has no administration in time range)  potassium chloride SA (KLOR-CON) CR tablet 40 mEq (has no administration in time range)  dexamethasone (DECADRON) injection 6 mg (6 mg Intravenous Given 01/26/20 1036)  sodium chloride 0.9 % bolus 500 mL (500 mLs Intravenous New Bag/Given 01/26/20 1037)     Procedures  /  Critical Care .Critical Care Performed by: Maudie Flakes, MD Authorized by: Maudie Flakes, MD   Critical care provider statement:    Critical care time (minutes):  32   Critical care was necessary to treat or prevent imminent or life-threatening deterioration of the following conditions:  Respiratory failure (Suspected COVID-19 with hypoxia)   Critical care was time spent personally by me on the following activities:  Discussions with consultants, evaluation of patient's response to treatment, examination of patient, ordering and performing treatments and interventions, ordering and review of laboratory studies, ordering and review of radiographic studies, pulse oximetry, re-evaluation of  patient's condition, obtaining history from patient or surrogate and review of old charts    ED Course and Medical Decision Making  I have reviewed the triage vital signs, the nursing notes, and pertinent available records from the EMR.  Pertinent labs & imaging results that were available during my care of the patient were reviewed by me and considered in my medical decision making (see below for details).     Favoring COVID-19 given the myriad of symptoms with hypoxia.  80% on room air, currently requiring 6 L nasal cannula to maintain saturations in the low 90s.  Appears comfortable on this amount of oxygen.  Awaiting labs, nasal swab, chest x-ray, will need admission.  Covid positive, admitted to hospital service for further care.  Barth Kirks. Sedonia Small, MD [place] [email or web address]  Final Clinical Impressions(s) / ED Diagnoses     ICD-10-CM   1. COVID-19  U07.1   2. Acute respiratory failure with hypoxia (HCC)  J96.01     ED Discharge Orders    None       Discharge Instructions Discussed with and Provided to Patient:   Discharge Instructions   None       Maudie Flakes, MD 01/26/20 1240

## 2020-01-26 NOTE — H&P (Signed)
History and Physical    Emit Christopher Santos [id number] DOB: 1951/02/13 DOA: 01/26/2020  PCP: Colon Branch, MD   Patient coming from: Home    Chief Complaint: Shortness of breath, cough, headache, generalized body aches  HPI: Christopher Santos is a 69 y.o. male with medical history significant of hyperlipidemia presents to the emergency department with complaints of generalized weakness, headache, shortness of breath, cough and chills.  He had been feeling like this since a week.  He lives with his wife.  He states his sister-in-law has recently been tested positive for a week ago.  Wife has not been tested yet. Patient is otherwise relatively healthy except for the history of hyperlipidemia. Patient seen and examined at the bedside in the emergency department.  Currently he is hemodynamically stable.  When he presented he was saturating 80% on room air.  Currently he is on 4 L of oxygen per minute. He denies any fever,chest pain, abdominal pain, nausea, vomiting, diarrhea, loss of taste or smell.  During my evaluation, he looked comfortable, he was not in any kind of respiratory distress.  ED Course: Chest x-ray done in the emergency department showed right sided pneumonia.  Procalcitonin of 0.2.  Was saturating 80% on room air.  On 4 L of oxygen per minute.  Started on remdesivir, Decadron.  Given a dose of Actemra.  CRP of 22  Review of Systems: As per HPI otherwise 10 point review of systems negative.    Past Medical History:  Diagnosis Date  . Hyperlipemia     Past Surgical History:  Procedure Laterality Date  . TONSILLECTOMY     at age 14     reports that he quit smoking about 28 years ago. He has never used smokeless tobacco. He reports current alcohol use of about 3.0 standard drinks of alcohol per week. He reports that he does not use drugs.  No Known Allergies  Family History  Problem Relation Age of Onset  . CAD Father 77       MI at 50  . Diabetes Neg Hx   . Colon  cancer Neg Hx   . Prostate cancer Neg Hx      Prior to Admission medications   Medication Sig Start Date End Date Taking? Authorizing Provider  acetaminophen (TYLENOL) 325 MG tablet Take by mouth every 6 (six) hours as needed for fever.   Yes [provider]  aspirin 81 MG tablet Take 81 mg by mouth at bedtime.    Yes [provider]  atorvastatin (LIPITOR) 40 MG tablet Take 1 tablet (40 mg total) by mouth at bedtime. 11/27/19  Yes Paz, Alda Berthold, MD  ibuprofen (ADVIL) 200 MG tablet Take 400 mg by mouth every 6 (six) hours as needed for fever or headache.   Yes [provider]  valACYclovir (VALTREX) 1000 MG tablet Take 1 tablet (1,000 mg total) 2 (two) times daily by mouth. Patient not taking: Reported on 11/15/2018 11/12/17   Colon Branch, MD    Physical Exam: Vitals:   01/26/20 1030 01/26/20 1033 01/26/20 1045 01/26/20 1100  BP:  (!) 148/77  (!) 150/80  Pulse: 71 69 66 68  Resp: (!) 21 20 18    Temp:      TempSrc:      SpO2: 93% 91% 93% 94%    Constitutional: Not in distress, well-built Vitals:   01/26/20 1030 01/26/20 1033 01/26/20 1045 01/26/20 1100  BP:  (!) 148/77  (!) 150/80  Pulse:  71 69 66 68  Resp: (!) 21 20 18    Temp:      TempSrc:      SpO2: 93% 91% 93% 94%   Eyes: PERRL ENMT: Mucous membranes are moist.  Neck: normal, supple, no masses, no thyromegaly Respiratory: Not much heard through the yellow steth but no obvious wheezes or crackles ,normal respiratory effort.  May be decreased air entry on the right side. no accessory muscle use.  Cardiovascular: Regular rate and rhythm, no murmurs / rubs / gallops. No extremity edema.  Abdomen: no tenderness, no masses palpated. No hepatosplenomegaly. Bowel sounds positive.  Musculoskeletal: no clubbing / cyanosis. No joint deformity upper and lower extremities.  Skin: no rashes, lesions, ulcers. No induration Neurologic: CN 2-12 grossly intact.  Strength 5/5 in all 4.  Psychiatric: Normal  judgment and insight. Alert and oriented x 3. Normal mood.   Foley Catheter:None  Labs on Admission: I have personally reviewed following labs and imaging studies  CBC: Recent Labs  Lab 01/26/20 1027  WBC 10.0  NEUTROABS 8.7*  HGB 14.2  HCT 40.4  MCV 95.3  PLT [id number]   Basic Metabolic Panel: Recent Labs  Lab 01/26/20 1027  NA 135  K 3.3*  CL 99  CO2 23  GLUCOSE 164*  BUN 26*  CREATININE 0.91  CALCIUM 8.7*   GFR: CrCl cannot be calculated (Unknown ideal weight.). Liver Function Tests: Recent Labs  Lab 01/26/20 1027  AST 57*  ALT 47*  ALKPHOS 112  BILITOT 1.0  PROT 7.0  ALBUMIN 3.3*   No results for input(s): LIPASE, AMYLASE in the last 168 hours. No results for input(s): AMMONIA in the last 168 hours. Coagulation Profile: Recent Labs  Lab 01/26/20 1027  INR 1.1   Cardiac Enzymes: No results for input(s): CKTOTAL, CKMB, CKMBINDEX, TROPONINI in the last 168 hours. BNP (last 3 results) No results for input(s): PROBNP in the last 8760 hours. HbA1C: No results for input(s): HGBA1C in the last 72 hours. CBG: No results for input(s): GLUCAP in the last 168 hours. Lipid Profile: No results for input(s): CHOL, HDL, LDLCALC, TRIG, CHOLHDL, LDLDIRECT in the last 72 hours. Thyroid Function Tests: No results for input(s): TSH, T4TOTAL, FREET4, T3FREE, THYROIDAB in the last 72 hours. Anemia Panel: Recent Labs    01/26/20 1027  FERRITIN 869*   Urine analysis: No results found for: COLORURINE, APPEARANCEUR, LABSPEC, PHURINE, GLUCOSEU, HGBUR, BILIRUBINUR, KETONESUR, PROTEINUR, UROBILINOGEN, NITRITE, LEUKOCYTESUR  Radiological Exams on Admission: DG Chest Portable 1 View  Result Date: 01/26/2020 CLINICAL DATA:  Shortness of breath, cough. EXAM: PORTABLE CHEST 1 VIEW COMPARISON:  None. FINDINGS: The heart size and mediastinal contours are within normal limits. No pneumothorax or pleural effusion is noted. Large right lung opacity is noted concerning for pneumonia.  Mild left basilar atelectasis or infiltrate is noted. The visualized skeletal structures are unremarkable. IMPRESSION: Large right lung opacity is noted concerning for pneumonia. Follow-up radiographs are recommended to ensure resolution. Mild left basilar atelectasis or infiltrate is noted as well. Electronically Signed   By: Marijo Conception M.D.   On: 01/26/2020 11:34     Assessment/Plan Principal Problem:   Pneumonia due to COVID-19 virus Active Problems:   Hyperlipidemia   Pneumonia due to 2019 novel coronavirus   Covid  19 pneumonia: Hypoxic on presentation.  Chest x-ray showed right sided pneumonia.  Elevated inflammatory markers. Currently on 4 L of oxygen per minute.  Continue Decadron, remdesivir.  Given a dose of Actemra Continue monitoring inflammatory markers.  Continue zinc and vitamin C. Procalcitonin nonreassuring for bacterial infection.  Antibiotics not started  Elevated lactic acid level: Most likely dehydrated due to poor oral intake.  He was given a bolus of IV normal saline.  Will continue gentle IV fluids till tomorrow.  Hyperlipidemia: Continue Lipitor    Severity of Illness: The appropriate patient status for this patient is INPATIENT.  DVT prophylaxis: Lovenox Code Status: Full Family Communication: None present at the bedside  consults called: None     Shelly Coss MD [facility]  01/26/2020, 12:11 PM

## 2020-01-26 NOTE — Progress Notes (Signed)
In the setting of worsening hypoxic respiratory failure, I explained the patient in detail, the use of advanced therapy:  COVID 19 convalescent plasma.  I informed him the risks of this intervention including: allergic reactions, volume overload and others.  I explained him the experimental nature of this intervention, in the face of current COVID 19 pandemic, along with the potential benefits of improvement of hypoxic respiratory failure and systemic inflammation.  He has agreed to proceed and consent was signed.

## 2020-01-26 NOTE — ED Notes (Signed)
Date and time results received: 01/26/20 1100    Test: Lactic acid  Critical Value: 2.1  Name of Provider Notified: Bero   Orders Received? Or Actions Taken?: n/a

## 2020-01-26 NOTE — ED Notes (Signed)
Per EMS pt complaint of weakness, fatigue, and fever for a week; exposure to COVID. Pt has been checking oxygen saturation at home; read 80% this AM so called EMS.

## 2020-01-26 NOTE — ED Notes (Signed)
[facility] called. 

## 2020-01-27 ENCOUNTER — Inpatient Hospital Stay (HOSPITAL_COMMUNITY): Payer: 59

## 2020-01-27 LAB — CBC WITH DIFFERENTIAL/PLATELET
Abs Immature Granulocytes: 0.13 10*3/uL — ABNORMAL HIGH (ref 0.00–0.07)
Basophils Absolute: 0 10*3/uL (ref 0.0–0.1)
Basophils Relative: 0 %
Eosinophils Absolute: 0 10*3/uL (ref 0.0–0.5)
Eosinophils Relative: 0 %
HCT: 39.4 % (ref 39.0–52.0)
Hemoglobin: 13.9 g/dL (ref 13.0–17.0)
Immature Granulocytes: 1 %
Lymphocytes Relative: 6 %
Lymphs Abs: 0.7 10*3/uL (ref 0.7–4.0)
MCH: 33.7 pg (ref 26.0–34.0)
MCHC: 35.3 g/dL (ref 30.0–36.0)
MCV: 95.4 fL (ref 80.0–100.0)
Monocytes Absolute: 0.5 10*3/uL (ref 0.1–1.0)
Monocytes Relative: 4 %
Neutro Abs: 10.4 10*3/uL — ABNORMAL HIGH (ref 1.7–7.7)
Neutrophils Relative %: 89 %
Platelets: 266 10*3/uL (ref 150–400)
RBC: 4.13 MIL/uL — ABNORMAL LOW (ref 4.22–5.81)
RDW: 12.5 % (ref 11.5–15.5)
WBC: 11.7 10*3/uL — ABNORMAL HIGH (ref 4.0–10.5)
nRBC: 0 % (ref 0.0–0.2)

## 2020-01-27 LAB — URINALYSIS, ROUTINE W REFLEX MICROSCOPIC
Bilirubin Urine: NEGATIVE
Glucose, UA: NEGATIVE mg/dL
Hgb urine dipstick: NEGATIVE
Ketones, ur: NEGATIVE mg/dL
Leukocytes,Ua: NEGATIVE
Nitrite: NEGATIVE
Protein, ur: 100 mg/dL — AB
Specific Gravity, Urine: 1.03 (ref 1.005–1.030)
pH: 6 (ref 5.0–8.0)

## 2020-01-27 LAB — COMPREHENSIVE METABOLIC PANEL
ALT: 67 U/L — ABNORMAL HIGH (ref 0–44)
AST: 68 U/L — ABNORMAL HIGH (ref 15–41)
Albumin: 3 g/dL — ABNORMAL LOW (ref 3.5–5.0)
Alkaline Phosphatase: 119 U/L (ref 38–126)
Anion gap: 13 (ref 5–15)
BUN: 24 mg/dL — ABNORMAL HIGH (ref 8–23)
CO2: 23 mmol/L (ref 22–32)
Calcium: 8.8 mg/dL — ABNORMAL LOW (ref 8.9–10.3)
Chloride: 101 mmol/L (ref 98–111)
Creatinine, Ser: 0.84 mg/dL (ref 0.61–1.24)
GFR calc Af Amer: >60 mL/min (ref >60)
GFR calc non Af Amer: >60 mL/min (ref >60)
Glucose, Bld: 141 mg/dL — ABNORMAL HIGH (ref 70–99)
Potassium: 4.1 mmol/L (ref 3.5–5.1)
Sodium: 137 mmol/L (ref 135–145)
Total Bilirubin: 1 mg/dL (ref 0.3–1.2)
Total Protein: 6.9 g/dL (ref 6.5–8.1)

## 2020-01-27 LAB — C-REACTIVE PROTEIN: CRP: 25.5 mg/dL — ABNORMAL HIGH (ref <1.0)

## 2020-01-27 LAB — D-DIMER, QUANTITATIVE: D-Dimer, Quant: 1.2 ug/mL-FEU — ABNORMAL HIGH (ref 0.00–0.50)

## 2020-01-27 LAB — LACTIC ACID, PLASMA: Lactic Acid, Venous: 1.5 mmol/L (ref 0.5–1.9)

## 2020-01-27 LAB — TYPE AND SCREEN
ABO/RH(D): O POS
Antibody Screen: NEGATIVE

## 2020-01-27 LAB — FERRITIN: Ferritin: 1024 ng/mL — ABNORMAL HIGH (ref 24–336)

## 2020-01-27 NOTE — Progress Notes (Signed)
Plasma started  Patient tolerated, fist 15 vitals completed

## 2020-01-27 NOTE — Progress Notes (Signed)
Drako G Demonte  [id number] DOB: 03/18/1951 DOA: 01/26/2020 PCP: Colon Branch, MD    Brief Narrative:  69 year old with a history of HLD who presented to the [facility], ED with complaints of generalized weakness, headache, shortness of breath, and chills for a week.  His sister and all have recently tested positive for Covid.  In the ED he was found to have a saturation of 80% on room air and required 4 L of oxygen support.  CXR noted a right sided pulmonary infiltrate.  He was dosed with Decadron, remdesivir, and Actemra while still in the ED.  Significant Events: 2/1 admit to [facility] via [place] ED  COVID-19 specific Treatment: Decadron 2/1 > Remdesivir 2/1 > Actemra 2/1 Convalescent plasma 2/2  Antimicrobials:  None  Subjective: Received convalescent plasma this morning w/o incident. O2 requirement has decreased over night. CXR this AM per my review shows persisting B pulm infiltrates R>L.  The patient is sitting up in a bedside chair.  He states he actually feels somewhat better than he did yesterday.  He denies chest pain abdominal pain nausea or vomiting.  He is alert and oriented and conversant.  Assessment & Plan:  COVID Pneumonia - acute hypoxic respiratory failure Continue remdesivir and Decadron -has been dosed with Actemra and convalescent plasma -inflammatory markers trending upward but clinically the patient appears to be improving -continue current interventions and monitor closely  Recent Labs  Lab 01/26/20 1027 01/27/20 0350  DDIMER 1.57* 1.20*  FERRITIN 869* 1,024*  CRP 22.8* 25.5*  ALT 47* 67*  PROCALCITON 0.22  --     Lactic acidosis Resolved as of this morning   Hyperlipidemia Discontinue Lipitor today in the setting of Covid plus remdesivir with increasing LFTs  DVT prophylaxis: Lovenox Code Status: FULL CODE Family Communication:  Disposition Plan: Inpatient treatment will be required another 3-4 days at minimum  Consultants:    none  Objective: Blood pressure (!) 160/90, pulse 67, temperature 98.3 F (36.8 C), temperature source Oral, resp. rate 18, height 6\' 3"  (1.905 m), weight 90.7 kg, SpO2 94 %.  Intake/Output Summary (Last 24 hours) at 01/27/2020 0750 Last data filed at 01/27/2020 0129 Gross per 24 hour  Intake 1125 ml  Output 320 ml  Net 805 ml   Filed Weights   01/26/20 1222  Weight: 90.7 kg    Examination: General: No acute respiratory distress Lungs: Fine crackles throughout with no wheezing Cardiovascular: Regular rate and rhythm without murmur gallop or rub normal S1 and S2 Abdomen: Nontender, nondistended, soft, bowel sounds positive, no rebound, no ascites, no appreciable mass Extremities: No significant cyanosis, clubbing, or edema bilateral lower extremities   CBC: Recent Labs  Lab 01/26/20 1027 01/26/20 2210 01/27/20 0350  WBC 10.0 11.1* 11.7*  NEUTROABS 8.7* 9.8* 10.4*  HGB 14.2 14.6 13.9  HCT 40.4 41.2 39.4  MCV 95.3 95.4 95.4  PLT 250 273 [id number]   Basic Metabolic Panel: Recent Labs  Lab 01/26/20 1027 01/27/20 0350  NA 135 137  K 3.3* 4.1  CL 99 101  CO2 23 23  GLUCOSE 164* 141*  BUN 26* 24*  CREATININE 0.91 0.84  CALCIUM 8.7* 8.8*   GFR: Estimated Creatinine Clearance: 103.4 mL/min (by C-G formula based on SCr of 0.84 mg/dL).  Liver Function Tests: Recent Labs  Lab 01/26/20 1027 01/27/20 0350  AST 57* 68*  ALT 47* 67*  ALKPHOS 112 119  BILITOT 1.0 1.0  PROT 7.0 6.9  ALBUMIN 3.3* 3.0*  Coagulation Profile: Recent Labs  Lab 01/26/20 1027  INR 1.1   HbA1C: Hgb A1c MFr Bld  Date/Time Value Ref Range Status  11/24/2019 08:26 AM 5.4 4.6 - 6.5 % Final    Comment:    Glycemic Control Guidelines for People with Diabetes:Non Diabetic:  <6%Goal of Therapy: <7%Additional Action Suggested:  >8%      Scheduled Meds: . sodium chloride   Intravenous Once  . vitamin C  500 mg Oral Daily  . aspirin  81 mg Oral QHS  . atorvastatin  40 mg Oral QHS  .  dexamethasone (DECADRON) injection  6 mg Intravenous Q24H  . enoxaparin (LOVENOX) injection  40 mg Subcutaneous Q24H  . mouth rinse  15 mL Mouth Rinse BID  . zinc sulfate  220 mg Oral Daily   Continuous Infusions: . remdesivir 100 mg in NS 100 mL       LOS: 1 day   Cherene Altes, MD [facility] Office  [phone] Pager - Text Page per Amion  If 7PM-7AM, please contact night-coverage per Amion 01/27/2020, 7:50 AM

## 2020-01-27 NOTE — Plan of Care (Signed)
  Problem: Education: Goal: Knowledge of risk factors and measures for prevention of condition will improve Outcome: Progressing   

## 2020-01-27 NOTE — Progress Notes (Signed)
Occupational Therapy Evaluation Patient Details Name: Christopher Santos MRN: [id number] DOB: 02/24/1951 Today's Date: 01/27/2020    History of Present Illness 69 Year old male admitted as transfer from [place] , with COVID and assoiciated pneumonia. PMH hyperlipdemia. Nonsmoker- moderate use of alcohol. According to patient, several family members have [name], and his spouse is being tested today.   Clinical Impression   PTA pt Independent in all ADLs and mobility. Pt currently able to ambulate 45ft without AD on 4L O2, [name] with SpO2 remaining above 92%. Pt able to converse with therapist during mobility without SOB. Pt setup/supervision for ADLs. Provided education on pursed lip breathing, use of flutter valve, and spirometer with pt able to return demonstrate without difficulties. No OT needs anticipated upon DC.     Follow Up Recommendations  No OT follow up    Equipment Recommendations  None recommended by OT    Recommendations for Other Services       Precautions / Restrictions Precautions Precautions: None;Fall Precaution Comments: Monitor O2 sats closely. Currently on 5 LPM, [name], but fluctuates between 87-92+ Restrictions Weight Bearing Restrictions: No      Mobility Bed Mobility Overal bed mobility: Independent             General bed mobility comments: Pt OOB upon OT arrival  Transfers Overall transfer level: Modified independent Equipment used: None Transfers: Sit to/from [name] Sit to Stand: Modified independent (Device/Increase time) Stand pivot transfers: Modified independent (Device/Increase time)            Balance Overall balance assessment: Mild deficits observed, not formally tested                                         ADL either performed or assessed with clinical judgement   ADL Overall ADL's : Needs assistance/impaired Eating/Feeding: Independent   Grooming: Set up   Upper Body Bathing: Set  up   Lower Body Bathing: Supervison/ safety   Upper Body Dressing : Set up   Lower Body Dressing: Supervision/safety   Toilet Transfer: Supervision/safety   Toileting- Clothing Manipulation and Hygiene: Supervision/safety       Functional mobility during ADLs: Modified independent General ADL Comments: Pt overall setup/SPV for ADLs, requires assistance for O2 cord mgmt; began education for pursed lip breathing     Vision         Perception     Praxis      Pertinent Vitals/Pain Pain Assessment: No/denies pain     Hand Dominance Right   Extremity/Trunk Assessment Upper Extremity Assessment Upper Extremity Assessment: Overall WFL for tasks assessed   Lower Extremity Assessment Lower Extremity Assessment: Defer to PT evaluation   Cervical / Trunk Assessment Cervical / Trunk Assessment: Normal   Communication Communication Communication: No difficulties   Cognition Arousal/Alertness: Awake/alert Behavior During Therapy: WFL for tasks assessed/performed Overall Cognitive Status: Within Functional Limits for tasks assessed                                 General Comments: pleasant and cooperative   General Comments  Monitor O2 sats closely- needs some reassurance/ he is very receptive to PT but mildly anxious (asked if he was in ICU)    Exercises Exercises: Other exercises General Exercises - Upper Extremity Shoulder Flexion: AROM;Seated;Theraband Shoulder ABduction: AROM;Theraband;Seated Shoulder  Horizontal ABduction: AROM;Seated;Theraband Elbow Flexion: AROM;Seated;Theraband Elbow Extension: AROM;Seated;Theraband General Exercises - Lower Extremity Ankle Circles/Pumps: AROM;Seated Short Arc Quad: AROM;Seated Heel Slides: AROM;Seated Hip ABduction/ADduction: AROM;Seated Straight Leg Raises: AROM Hip Flexion/Marching: AROM;Seated Other Exercises Other Exercises: Instructed pt in flutter valve, spirometer 1748mL. Able to return demo  well. Other Exercises: UE and LE ex- issued printed instructions along with theraband   Shoulder Instructions      Home Living Family/patient expects to be discharged to:: Private residence Living Arrangements: Spouse/significant other Available Help at Discharge: Family;Available 24 hours/day Type of Home: Apartment Home Access: Level entry     Home Layout: One level     Bathroom Shower/Tub: Teacher, early years/pre: Standard Bathroom Accessibility: Yes How Accessible: Accessible via wheelchair;Accessible via walker Home Equipment: Grab bars - tub/shower          Prior Functioning/Environment Level of Independence: Independent        Comments: Still working full time; works with Production designer, 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        OT Problem List: Decreased strength;Decreased activity tolerance;Cardiopulmonary status limiting activity      OT Treatment/Interventions: Self-care/ADL training;Therapeutic exercise;Neuromuscular education;Energy conservation;DME and/or AE instruction;Therapeutic activities;Patient/family education    OT Goals(Current goals can be found in the care plan section) Acute Rehab OT Goals Patient Stated Goal: Just to get well and be able to go home OT Goal Formulation: With patient Time For Goal Achievement: 02/10/20 Potential to Achieve Goals: Good  OT Frequency: Min 3X/week   Barriers to D/C:            Co-evaluation              AM-PAC OT "6 Clicks" Daily Activity     Outcome Measure Help from another person eating meals?: None Help from another person taking care of personal grooming?: A Little Help from another person toileting, which includes using toliet, bedpan, or urinal?: A Little Help from another person bathing (including washing, rinsing, drying)?: A Little Help from another person to put on and taking off regular upper body clothing?: A Little Help from another person to put on and taking off regular lower body clothing?: A Little 6  Click Score: 19   End of Session Equipment Utilized During Treatment: Oxygen(4 L, [name]) Nurse Communication: Mobility status;Other (comment)(Discussed O2 needs)  Activity Tolerance: Patient tolerated treatment well(O2 remained 92% and above with 4 L O2, [name]) Patient left: in chair  OT Visit Diagnosis: Muscle weakness (generalized) (M62.81)                Time: 1150-1240 OT Time Calculation (min): 50 min Charges:  OT General Charges $OT Visit: 1 Visit OT Evaluation $OT Eval Moderate Complexity: 1 Mod OT Treatments $Self Care/Home Management : 8-22 mins $Therapeutic Activity: 8-22 mins  Maurie Boettcher, OT/L   Acute OT Clinical Specialist [place] Pager [phone] Office [phone]   [facility] 01/27/2020, 1:55 PM

## 2020-01-27 NOTE — Evaluation (Signed)
Physical Therapy Evaluation Patient Details Name: Christopher Santos MRN: [id number] DOB: 1951-04-18 Today's Date: 01/27/2020   History of Present Illness  69 Year old male admitted as transfer from [place] , with COVID and assoiciated pneumonia. PMH hyperlipdemia. Nonsmoker- moderate use of alcohol. According to patient, several family members have Nixa, and his spouse is being tested today.  Clinical Impression  He is very pleasant and receptive to PT. Originally from [place]. Married with no children, one pet (cat). Lives with spouse in an apt, level, no outside steps. Walk in shower, grab bars. Commode is standard height. No AD for ambulation. He works full time. He presents with generalized weakness. Currently on 5 LPM, [place].Needs monitoring due to desats in the 80's, but fairly quick recovery (3-5 minutes). Ambulated bed to [facility] chair with Close Supervision-min guard. Reviewed IS and Flutter unit, doing good return demo with both. Instructed in UE/LE strengthening- printed instructions issued along with theraband. Should benefit from ongoing PT while hospitalized to focus on endurance, strength, energy conservation, and safe ambulation.    Follow Up Recommendations No PT follow up    Equipment Recommendations    None   Recommendations for Other Services   None    Precautions / Restrictions Precautions Precautions: None;Fall Precaution Comments: Monitor O2 sats closely. Currently on 5 LPM, [place], but fluctuates between 87-92+ Restrictions Weight Bearing Restrictions: No      Mobility  Bed Mobility Overal bed mobility: Independent                Transfers Overall transfer level: Needs assistance Equipment used: 1 person hand held assist(Close supervision) Transfers: Sit to/from Stand Sit to Stand: Supervision(Close supervision)            Ambulation/Gait Ambulation/Gait assistance: Supervision;Min guard Gait Distance (Feet): 8 Feet Assistive device: None Gait  Pattern/deviations: WFL(Within Functional Limits)   Gait velocity interpretation: 1.31 - 2.62 ft/sec, indicative of limited community Conservation officer, historic buildings [place] (Stroke Patients Only)       Balance Overall balance assessment: Mild deficits observed, not formally tested                                           Pertinent Vitals/Pain      Home Living Family/patient expects to be discharged to:: Private residence Living Arrangements: Spouse/significant other Available Help at Discharge: Family;Available 24 hours/day Type of Home: Apartment       Home Layout: One level Home Equipment: Grab bars - tub/shower      Prior Function Level of Independence: Independent         Comments: Still working full time     Hand Dominance   Dominant Hand: Right    Extremity/Trunk Assessment        Lower Extremity Assessment Lower Extremity Assessment: Generalized weakness    Cervical / Trunk Assessment Cervical / Trunk Assessment: Normal  Communication   Communication: No difficulties  Cognition Arousal/Alertness: Awake/alert Behavior During Therapy: WFL for tasks assessed/performed Overall Cognitive Status: Within Functional Limits for tasks assessed                                 General Comments: Very pleasant and cooperative, receptive to PT. Should benefit  from ongoing PT to further address endurance, strength, and energy conservation as he progresses in ambulation.      General Comments General comments (skin integrity, edema, etc.): Monitor O2 sats closely- needs some reassurance/ he is very receptive to PT but mildly anxious (asked if he was in ICU)    Exercises General Exercises - Upper Extremity Shoulder Flexion: AROM;Seated;Theraband Shoulder ABduction: AROM;Theraband;Seated Shoulder Horizontal ABduction: AROM;Seated;Theraband Elbow Flexion: AROM;Seated;Theraband Elbow  Extension: AROM;Seated;Theraband General Exercises - Lower Extremity Ankle Circles/Pumps: AROM;Seated Short Arc Quad: AROM;Seated Heel Slides: AROM;Seated Hip ABduction/ADduction: AROM;Seated Straight Leg Raises: AROM Hip Flexion/Marching: AROM;Seated Other Exercises Other Exercises: Practiced IS and Flutter Valve unit. Able to achieve 1000 with IS for 8 of 10 reps Other Exercises: UE and LE ex- issued printed instructions along with theraband   Assessment/Plan    PT Assessment Patient needs continued PT services  PT Problem List Decreased strength;Decreased activity tolerance;Decreased mobility;Cardiopulmonary status limiting activity       PT Treatment Interventions Gait training;Functional mobility training;Therapeutic activities;Therapeutic exercise;Patient/family education    PT Goals (Current goals can be found in the Care Plan section)  Acute Rehab PT Goals Patient Stated Goal: Just to get well and be able to go home PT Goal Formulation: With patient Time For Goal Achievement: 02/10/20 Potential to Achieve Goals: Good    Frequency Min 3X/week   Barriers to discharge        Co-evaluation               AM-PAC PT "6 Clicks" Mobility  Outcome Measure Help needed turning from your back to your side while in a flat bed without using bedrails?: None Help needed moving from lying on your back to sitting on the side of a flat bed without using bedrails?: None Help needed moving to and from a bed to a chair (including a wheelchair)?: A Little Help needed standing up from a chair using your arms (e.g., wheelchair or bedside chair)?: A Little Help needed to walk in hospital room?: A Little Help needed climbing 3-5 steps with a railing? : A Little 6 Click Score: 20    End of Session   Activity Tolerance: Patient tolerated treatment well Patient left: in chair Nurse Communication: Mobility status PT Visit Diagnosis: Muscle weakness (generalized) (M62.81)    Time:  [id number] PT Time Calculation (min) (ACUTE ONLY): 55 min   Charges:   PT Evaluation $PT Eval Moderate Complexity: 1 Mod PT Treatments $Gait Training: 8-22 mins $Therapeutic Exercise: 23-37 mins        Rollen Sox, PT # [phone] CGV cell  [name] 01/27/2020, 10:32 AM

## 2020-01-27 NOTE — Plan of Care (Signed)
Ambulated around the unit x two. O2 sats remained in the low 90's. Occasionally dipped into the high 80's (85% was the lowest observed), but quickly recovered to low 90's. Patient remained on RA.   Back in bed, currently on 2L [place] for comfort measures. He reported feeling a little "wobbly" but otherwise able to tolerate ambulating the increased distance.   Problem: Education: Goal: Knowledge of risk factors and measures for prevention of condition will improve Outcome: Progressing   Problem: Coping: Goal: Psychosocial and spiritual needs will be supported Outcome: Progressing   Problem: Respiratory: Goal: Will maintain a patent airway Outcome: Progressing Goal: Complications related to the disease process, condition or treatment will be avoided or minimized Outcome: Progressing

## 2020-01-28 DIAGNOSIS — E785 Hyperlipidemia, unspecified: Secondary | ICD-10-CM

## 2020-01-28 DIAGNOSIS — J9601 Acute respiratory failure with hypoxia: Secondary | ICD-10-CM

## 2020-01-28 DIAGNOSIS — R7401 Elevation of levels of liver transaminase levels: Secondary | ICD-10-CM

## 2020-01-28 LAB — CBC WITH DIFFERENTIAL/PLATELET
Abs Immature Granulocytes: 0.2 10*3/uL — ABNORMAL HIGH (ref 0.00–0.07)
Basophils Absolute: 0 10*3/uL (ref 0.0–0.1)
Basophils Relative: 0 %
Eosinophils Absolute: 0 10*3/uL (ref 0.0–0.5)
Eosinophils Relative: 0 %
HCT: 39.1 % (ref 39.0–52.0)
Hemoglobin: 13.3 g/dL (ref 13.0–17.0)
Immature Granulocytes: 2 %
Lymphocytes Relative: 8 %
Lymphs Abs: 0.8 10*3/uL (ref 0.7–4.0)
MCH: 32.9 pg (ref 26.0–34.0)
MCHC: 34 g/dL (ref 30.0–36.0)
MCV: 96.8 fL (ref 80.0–100.0)
Monocytes Absolute: 0.7 10*3/uL (ref 0.1–1.0)
Monocytes Relative: 6 %
Neutro Abs: 9.4 10*3/uL — ABNORMAL HIGH (ref 1.7–7.7)
Neutrophils Relative %: 84 %
Platelets: 320 10*3/uL (ref 150–400)
RBC: 4.04 MIL/uL — ABNORMAL LOW (ref 4.22–5.81)
RDW: 12.6 % (ref 11.5–15.5)
WBC: 11.2 10*3/uL — ABNORMAL HIGH (ref 4.0–10.5)
nRBC: 0 % (ref 0.0–0.2)

## 2020-01-28 LAB — COMPREHENSIVE METABOLIC PANEL
ALT: 79 U/L — ABNORMAL HIGH (ref 0–44)
AST: 66 U/L — ABNORMAL HIGH (ref 15–41)
Albumin: 3 g/dL — ABNORMAL LOW (ref 3.5–5.0)
Alkaline Phosphatase: 105 U/L (ref 38–126)
Anion gap: 12 (ref 5–15)
BUN: 34 mg/dL — ABNORMAL HIGH (ref 8–23)
CO2: 25 mmol/L (ref 22–32)
Calcium: 8.8 mg/dL — ABNORMAL LOW (ref 8.9–10.3)
Chloride: 104 mmol/L (ref 98–111)
Creatinine, Ser: 0.82 mg/dL (ref 0.61–1.24)
GFR calc Af Amer: >60 mL/min (ref >60)
GFR calc non Af Amer: >60 mL/min (ref >60)
Glucose, Bld: 144 mg/dL — ABNORMAL HIGH (ref 70–99)
Potassium: 4.2 mmol/L (ref 3.5–5.1)
Sodium: 141 mmol/L (ref 135–145)
Total Bilirubin: 0.9 mg/dL (ref 0.3–1.2)
Total Protein: 6.5 g/dL (ref 6.5–8.1)

## 2020-01-28 LAB — FERRITIN: Ferritin: 1089 ng/mL — ABNORMAL HIGH (ref 24–336)

## 2020-01-28 LAB — PREPARE FRESH FROZEN PLASMA

## 2020-01-28 LAB — C-REACTIVE PROTEIN: CRP: 15.4 mg/dL — ABNORMAL HIGH (ref <1.0)

## 2020-01-28 LAB — BPAM FFP
Blood Product Expiration Date: 202102030040
ISSUE DATE / TIME: 202102020041
Unit Type and Rh: 5100

## 2020-01-28 LAB — D-DIMER, QUANTITATIVE: D-Dimer, Quant: 1.16 ug/mL-FEU — ABNORMAL HIGH (ref 0.00–0.50)

## 2020-01-28 NOTE — Progress Notes (Signed)
Christopher Santos  [id number] DOB: 1951/12/24 DOA: 01/26/2020 PCP: Colon Branch, MD    Brief Narrative:  69 year old with a history of HLD who presented to the [facility], ED with complaints of generalized weakness, headache, shortness of breath, and chills for a week.  His sister and all have recently tested positive for Covid.  In the ED he was found to have a saturation of 80% on room air and required 4 L of oxygen support.  CXR noted a right sided pulmonary infiltrate.  He was dosed with Decadron, remdesivir, and Actemra while still in the ED.  Significant Events: 2/1 admit to [facility] via [place] ED  COVID-19 specific Treatment: Decadron 2/1 > Remdesivir 2/1 > Actemra 2/1 Convalescent plasma 2/2  Antimicrobials:  None  Subjective: Patient states that he is feeling better this morning.  He states that he was able to come off of oxygen.  He continues to have some cough.  Shortness of breath is improved.  He has ambulated in the room.    Assessment & Plan:  Pneumonia due to COVID-19/acute respiratory failure with hypoxia Patient remains on remdesivir and steroids.  He also received Actemra and convalescent plasma due to severity of his illness and significantly elevated CRP levels.  Patient seems to be improving.  He has been weaned down to room air this morning.  He was requiring 6 L of oxygen yesterday.  He is feeling stronger.  Inflammatory markers have improved.  Continue to trend.  Today is day 3 of his remdesivir.  Incentive spirometry mobilization out of bed to chair.  Recent Labs  Lab 01/26/20 1027 01/27/20 0350 01/28/20 0300  DDIMER 1.57* 1.20* 1.16*  FERRITIN 869* 1,024* 1,089*  CRP 22.8* 25.5* 15.4*  ALT 47* 67* 79*  PROCALCITON 0.22  --   --     Lactic acidosis Resolved  Hyperlipidemia Discontinued Lipitor in the setting of Covid plus remdesivir with increasing LFTs  DVT prophylaxis: Lovenox Code Status: FULL CODE Family Communication: Discussed  with the patient.  We will try to update his wife later today. Disposition Plan: Continue to mobilize.  Anticipate discharge in 24 to 48 hours.  Consultants:  none  Objective: Blood pressure (!) 149/91, pulse (!) 58, temperature 98.7 F (37.1 C), temperature source Oral, resp. rate 18, height 6\' 3"  (1.905 m), weight 90.7 kg, SpO2 (!) 88 %. No intake or output data in the 24 hours ending 01/28/20 1214 Filed Weights   01/26/20 1222  Weight: 90.7 kg    Examination:  General appearance: Awake alert.  In no distress Resp: Normal effort at rest.  Coarse breath sounds with crackles at the bases bilaterally.  No wheezing or rhonchi. Cardio: S1-S2 is normal regular.  No S3-S4.  No rubs murmurs or bruit GI: Abdomen is soft.  Nontender nondistended.  Bowel sounds are present normal.  No masses organomegaly Extremities: No edema.  Full range of motion of lower extremities. Neurologic: Alert and oriented x3.  No focal neurological deficits.     CBC: Recent Labs  Lab 01/26/20 2210 01/27/20 0350 01/28/20 0300  WBC 11.1* 11.7* 11.2*  NEUTROABS 9.8* 10.4* 9.4*  HGB 14.6 13.9 13.3  HCT 41.2 39.4 39.1  MCV 95.4 95.4 96.8  PLT 273 266 [id number]   Basic Metabolic Panel: Recent Labs  Lab 01/26/20 1027 01/27/20 0350 01/28/20 0300  NA 135 137 141  K 3.3* 4.1 4.2  CL 99 101 104  CO2 23 23 25   GLUCOSE 164* 141*  144*  BUN 26* 24* 34*  CREATININE 0.91 0.84 0.82  CALCIUM 8.7* 8.8* 8.8*   GFR: Estimated Creatinine Clearance: 105.9 mL/min (by C-G formula based on SCr of 0.82 mg/dL).  Liver Function Tests: Recent Labs  Lab 01/26/20 1027 01/27/20 0350 01/28/20 0300  AST 57* 68* 66*  ALT 47* 67* 79*  ALKPHOS 112 119 105  BILITOT 1.0 1.0 0.9  PROT 7.0 6.9 6.5  ALBUMIN 3.3* 3.0* 3.0*    Coagulation Profile: Recent Labs  Lab 01/26/20 1027  INR 1.1   HbA1C: Hgb A1c MFr Bld  Date/Time Value Ref Range Status  11/24/2019 08:26 AM 5.4 4.6 - 6.5 % Final    Comment:    Glycemic  Control Guidelines for People with Diabetes:Non Diabetic:  <6%Goal of Therapy: <7%Additional Action Suggested:  >8%      Scheduled Meds: . vitamin C  500 mg Oral Daily  . aspirin  81 mg Oral QHS  . dexamethasone (DECADRON) injection  6 mg Intravenous Q24H  . enoxaparin (LOVENOX) injection  40 mg Subcutaneous Q24H  . mouth rinse  15 mL Mouth Rinse BID  . zinc sulfate  220 mg Oral Daily   Continuous Infusions: . remdesivir 100 mg in NS 100 mL 100 mg (01/28/20 0825)     LOS: 2 days   [facility] Office  [phone] Pager - Text Page per Shea Evans  If 7PM-7AM, please contact night-coverage per Amion 01/28/2020, 12:14 PM

## 2020-01-28 NOTE — Plan of Care (Signed)
  Problem: Education: Goal: Knowledge of risk factors and measures for prevention of condition will improve Outcome: Progressing   

## 2020-01-28 NOTE — Progress Notes (Signed)
Physical Therapy Treatment Patient Details Name: Christopher Santos MRN: [id number] DOB: Apr 24, 1951 Today's Date: 01/28/2020    History of Present Illness 69 Year old male admitted as transfer from [place] , with COVID and assoiciated pneumonia. PMH hyperlipdemia. Nonsmoker- moderate use of alcohol. According to patient, several family members have [place], and his spouse is being tested today.    PT Comments    Sitting on edge of bed on RA when PT arrived. O2 sats fluctuating in the 80s. Practiced pursed lip breathing. Discussed ambulating into the hallway. Since he was desatting into 86-87% prior to any exertion and he indicated that he was feeling "a little short of breath", applied 2 lpm, [name] on portable O2 tanke- and had portable monitor for vitals. [place] to ambulate in hallway 100 feet maintaining 95-100% O2 sats. Once back in room, sat in BS chair, sats dropped briefly (2 minutes) to 88, then recovered. Removed O2- notified RN that he was back on RA.(91 when PT left room). He was reminded to use IS, and Flutter unit hourly, as well UE/LE exercises on printed sheets (HEP) at least twice daily. Should continue to benefit from PT to address goals in accordance with progression to optimal functional outcomes.  Follow Up Recommendations  No PT follow up     Equipment Recommendations       Recommendations for Other Services       Precautions / Restrictions Precautions Precautions: None;Fall Precaution Comments: Monitor O2 sats closely. Currently on 5 LPM, [place], but fluctuates between 87-92+     On exertion and in preparation to ambulate, he dropped to 85%. Replaced O2 @ 2 lpm, [place] for ambulation Restrictions Weight Bearing Restrictions: No    Mobility  Bed Mobility Overal bed mobility: Independent             General bed mobility comments: Patient sitting on edge of bed when PT arrived.  Transfers Overall transfer level: Modified independent Equipment used: None Transfers: Sit  to/from [name] Sit to Stand: Modified independent (Device/Increase time) Stand pivot transfers: Modified independent (Device/Increase time)          Ambulation/Gait Ambulation/Gait assistance: Supervision;Min guard Gait Distance (Feet): 110 Feet Assistive device: None Gait Pattern/deviations: Narrow base of support;Shuffle   Gait velocity interpretation: 1.31 - 2.62 ft/sec, indicative of limited community ambulator General Gait Details: He maintained 95-100% on 2 LPM [name] throughout ambulation. Once back in room, removed O2- left cannula in reach, but was on RA when PT left room. 89-92%   Stairs             Wheelchair Mobility    Modified Rankin (Stroke Patients Only)       Balance Overall balance assessment: Mild deficits observed, not formally tested                                          Cognition Arousal/Alertness: Awake/alert Behavior During Therapy: WFL for tasks assessed/performed Overall Cognitive Status: Within Functional Limits for tasks assessed                                 General Comments: pleasant and cooperative      Exercises Other Exercises Other Exercises: Reminded to use IS and Flutter valve units hourly Other Exercises: Reminded to perform LE ex in accordance with printed instructions  given. Twice daily- 10+ reps. > he was on phone at end of session and indicated that he needed to finish phone call    General Comments General comments (skin integrity, edema, etc.): Monitor O2 sats closely and reassure that what is also important is how he is feeling.      Pertinent Vitals/Pain Pain Assessment: No/denies pain    Home Living                      Prior Function            PT Goals (current goals can now be found in the care plan section) Acute Rehab PT Goals Patient Stated Goal: Just to get well and be able to go home PT Goal Formulation: With patient Time For Goal  Achievement: 02/10/20 Potential to Achieve Goals: Good Progress towards PT goals: Progressing toward goals    Frequency    Min 3X/week      PT Plan      Co-evaluation              AM-PAC PT "6 Clicks" Mobility   Outcome Measure  Help needed turning from your back to your side while in a flat bed without using bedrails?: None Help needed moving from lying on your back to sitting on the side of a flat bed without using bedrails?: None Help needed moving to and from a bed to a chair (including a wheelchair)?: A Little Help needed standing up from a chair using your arms (e.g., wheelchair or bedside chair)?: None Help needed to walk in hospital room?: A Little Help needed climbing 3-5 steps with a railing? : A Little 6 Click Score: 21    End of Session   Activity Tolerance: Patient tolerated treatment well Patient left: in chair;with call bell/phone within reach Nurse Communication: Mobility status PT Visit Diagnosis: Muscle weakness (generalized) (M62.81)     Time: [id number] PT Time Calculation (min) (ACUTE ONLY): 35 min  Charges:  $Gait Training: 8-22 mins $Therapeutic Exercise: 8-22 mins                  Christopher Santos P, PT # [phone] CGV cell  Christopher Santos 01/28/2020, 10:51 AM

## 2020-01-29 ENCOUNTER — Ambulatory Visit: Payer: 59

## 2020-01-29 LAB — COMPREHENSIVE METABOLIC PANEL
ALT: 94 U/L — ABNORMAL HIGH (ref 0–44)
AST: 71 U/L — ABNORMAL HIGH (ref 15–41)
Albumin: 3.1 g/dL — ABNORMAL LOW (ref 3.5–5.0)
Alkaline Phosphatase: 99 U/L (ref 38–126)
Anion gap: 12 (ref 5–15)
BUN: 32 mg/dL — ABNORMAL HIGH (ref 8–23)
CO2: 26 mmol/L (ref 22–32)
Calcium: 8.9 mg/dL (ref 8.9–10.3)
Chloride: 106 mmol/L (ref 98–111)
Creatinine, Ser: 0.86 mg/dL (ref 0.61–1.24)
GFR calc Af Amer: >60 mL/min (ref >60)
GFR calc non Af Amer: >60 mL/min (ref >60)
Glucose, Bld: 112 mg/dL — ABNORMAL HIGH (ref 70–99)
Potassium: 4.5 mmol/L (ref 3.5–5.1)
Sodium: 144 mmol/L (ref 135–145)
Total Bilirubin: 0.8 mg/dL (ref 0.3–1.2)
Total Protein: 6.8 g/dL (ref 6.5–8.1)

## 2020-01-29 LAB — CBC WITH DIFFERENTIAL/PLATELET
Abs Immature Granulocytes: 0.33 10*3/uL — ABNORMAL HIGH (ref 0.00–0.07)
Basophils Absolute: 0 10*3/uL (ref 0.0–0.1)
Basophils Relative: 0 %
Eosinophils Absolute: 0 10*3/uL (ref 0.0–0.5)
Eosinophils Relative: 0 %
HCT: 42.4 % (ref 39.0–52.0)
Hemoglobin: 14.4 g/dL (ref 13.0–17.0)
Immature Granulocytes: 4 %
Lymphocytes Relative: 15 %
Lymphs Abs: 1.3 10*3/uL (ref 0.7–4.0)
MCH: 33.2 pg (ref 26.0–34.0)
MCHC: 34 g/dL (ref 30.0–36.0)
MCV: 97.7 fL (ref 80.0–100.0)
Monocytes Absolute: 0.8 10*3/uL (ref 0.1–1.0)
Monocytes Relative: 9 %
Neutro Abs: 6.6 10*3/uL (ref 1.7–7.7)
Neutrophils Relative %: 72 %
Platelets: 293 10*3/uL (ref 150–400)
RBC: 4.34 MIL/uL (ref 4.22–5.81)
RDW: 12.6 % (ref 11.5–15.5)
WBC: 9.1 10*3/uL (ref 4.0–10.5)
nRBC: 0 % (ref 0.0–0.2)

## 2020-01-29 LAB — D-DIMER, QUANTITATIVE: D-Dimer, Quant: 1.18 ug/mL-FEU — ABNORMAL HIGH (ref 0.00–0.50)

## 2020-01-29 LAB — HEPATITIS B SURFACE ANTIGEN: Hepatitis B Surface Ag: NONREACTIVE

## 2020-01-29 LAB — C-REACTIVE PROTEIN: CRP: 9 mg/dL — ABNORMAL HIGH (ref <1.0)

## 2020-01-29 LAB — FERRITIN: Ferritin: 685 ng/mL — ABNORMAL HIGH (ref 24–336)

## 2020-01-29 MED ORDER — FUROSEMIDE 10 MG/ML IJ SOLN
40.0000 mg | Freq: Once | INTRAMUSCULAR | Status: AC
  Administered 2020-01-29: 40 mg via INTRAVENOUS
  Filled 2020-01-29: qty 4

## 2020-01-29 NOTE — Progress Notes (Signed)
Occupational Therapy Treatment/Discharge Patient Details Name: Christopher Santos MRN: [id number] DOB: 1951-06-25 Today's Date: 01/29/2020    History of present illness 69 Year old male admitted as transfer from [place] , with COVID and assoiciated pneumonia. PMH hyperlipdemia. Nonsmoker- moderate use of alcohol. According to patient, several family members have [place], and his spouse is being tested today.   OT comments  Patient has met all OT goals resulting in progression to Independent level with use of portable O2 prn. Patient was educated and given a handout on energy conservation techniques for home environment. Patient was able to verbalize 5 energy conservation techniques for ADL/IADL tasks. Patient was able to complete flutter valve (10 reps for 6-10 sec) and IS breathing (2261m) exercises in room with no assistance. Patient was able to complete standing grooming task on room air with stats at 89% and recovery time of 10 seconds using PLB. Pt will be discharged from acute OT services with recommendation for no further OT services.     Follow Up Recommendations  No OT follow up    Equipment Recommendations  3 in 1 bedside commode    Recommendations for Other Services      Precautions / Restrictions         Mobility Bed Mobility Overal bed mobility: Independent                Transfers Overall transfer level: Modified independent     Sit to Stand: Modified independent (Device/Increase time)         General transfer comment: Increase in time for functional transfers     Balance Overall balance assessment: Modified Independent                                         ADL either performed or assessed with clinical judgement   ADL                                               Vision       Perception     Praxis      Cognition Arousal/Alertness: Awake/alert   Overall Cognitive Status: Within Functional Limits  for tasks assessed                                 General Comments: Very Pleasant         Exercises Other Exercises Other Exercises: Flutter Valve Unit for 10 reps with hold time of 6-10 seconds  Other Exercises: IS with breathing level at 22563mfor 8 reps    Shoulder Instructions       General Comments      Pertinent Vitals/ Pain       Pain Assessment: No/denies pain  Home Living                                          Prior Functioning/Environment              Frequency           Progress Toward Goals  OT Goals(current goals can now be found in the care  plan section)  Progress towards OT goals: Goals met/education completed, patient discharged from OT  ADL Goals Pt Will Perform Lower Body Dressing: Independently Pt Will Transfer to Toilet: Independently Pt Will Perform Toileting - Clothing Manipulation and hygiene: Independently Pt/caregiver will Perform Home Exercise Program: Increased strength;With written HEP provided Additional ADL Goal #1: Pt to verbalize a minimum of 3 energy conservation strategies Independently to utilize during ADL/IADL tasks.  Plan      Co-evaluation                 AM-PAC OT "6 Clicks" Daily Activity     Outcome Measure   Help from another person eating meals?: None Help from another person taking care of personal grooming?: None Help from another person toileting, which includes using toliet, bedpan, or urinal?: None Help from another person bathing (including washing, rinsing, drying)?: A Little Help from another person to put on and taking off regular upper body clothing?: None Help from another person to put on and taking off regular lower body clothing?: None 6 Click Score: 23    End of Session Equipment Utilized During Treatment: Oxygen      Activity Tolerance Patient tolerated treatment well   Patient Left in bed;with call bell/phone within reach   Nurse Communication  Mobility status        Time: 1455-1535 OT Time Calculation (min): 40 min  Charges: OT General Charges $OT Visit: 1 Visit OT Treatments $Self Care/Home Management : 8-22 mins $Therapeutic Activity: 8-22 mins $Therapeutic Exercise: 8-22 mins  Avrom Robarts OTR/L    Tredarius Cobern 01/29/2020, 4:14 PM

## 2020-01-29 NOTE — Progress Notes (Signed)
22/03/2020 Checked on patient x 2 today and he was not available for PT. He did ambulate with full monitoring by OT. Will reschedule and continue to monitor.   [address] PT  [place], [place] # [phone] CGV cell

## 2020-01-29 NOTE — Progress Notes (Signed)
Patient deferred family update. 

## 2020-01-29 NOTE — Progress Notes (Signed)
SATURATION QUALIFICATIONS: (This note is used to comply with regulatory documentation for home oxygen)  Patient Saturations on Room Air at Rest = 95%  Patient Saturations on Room Air while Ambulating = 88%  Patient Saturations on 2 Liters of oxygen while Ambulating = 91%  Please briefly explain why patient needs home oxygen: Patient mobilized 230 feet on room air with stats dropping to high 80s during mobilization and brushing of teeth while standing at sink. Recommend 1-2L of O2 prn for home use with energy conservation techniques.

## 2020-01-29 NOTE — Plan of Care (Signed)
  Problem: Education: Goal: Knowledge of risk factors and measures for prevention of condition will improve Outcome: Progressing   Problem: Coping: Goal: Psychosocial and spiritual needs will be supported Outcome: Progressing   Problem: Respiratory: Goal: Will maintain a patent airway Outcome: Progressing Goal: Complications related to the disease process, condition or treatment will be avoided or minimized Outcome: Progressing   

## 2020-01-29 NOTE — Progress Notes (Addendum)
Ardit G Mcinroy  [id number] DOB: November 09, 1951 DOA: 01/26/2020 PCP: Colon Branch, MD    Brief Narrative:  69 year old with a history of HLD who presented to the [facility], ED with complaints of generalized weakness, headache, shortness of breath, and chills for a week.  His sister and all have recently tested positive for Covid.  In the ED he was found to have a saturation of 80% on room air and required 4 L of oxygen support.  CXR noted a right sided pulmonary infiltrate.  He was dosed with Decadron, remdesivir, and Actemra while still in the ED.  Significant Events: 2/1 admit to [facility] via [facility]  COVID-19 specific Treatment: Decadron 2/1 > Remdesivir 2/1 > Actemra 2/1 Convalescent plasma 2/2  Antimicrobials:  None  Subjective: Patient states that he is feeling well.  However does tend to get short of breath with exertion.  Denies any chest pain.  Occasional cough.  Assessment & Plan:  Pneumonia due to COVID-19/acute respiratory failure with hypoxia Patient remains on remdesivir and steroids.  He will complete course of remdesivir tomorrow.  He also received Actemra and convalescent plasma.  CRP has been improving.  D-dimer is stable.  Ferritin is better as well.  He is saturating in the early 90s on room air but then tends to desaturate on exertion.  He will likely need home oxygen.  Continue with incentive spirometry mobilization out of bed to chair.  Lasix x1 today.  Recent Labs  Lab 01/26/20 1027 01/27/20 0350 01/28/20 0300 01/29/20 0426  DDIMER 1.57* 1.20* 1.16* 1.18*  FERRITIN 869* 1,024* 1,089* 685*  CRP 22.8* 25.5* 15.4* 9.0*  ALT 47* 67* 79* 94*  PROCALCITON 0.22  --   --   --     Lactic acidosis Resolved  Hyperlipidemia Holding statin due to elevated LFTs.  Transaminitis Due to COVID-19.  Monitor periodically.  DVT prophylaxis: Lovenox Code Status: FULL CODE Family Communication: Discussed with patient. Disposition Plan: Continue to  mobilize.  Anticipate discharge tomorrow.  Will need home oxygen.  Consultants:  none  Objective: Blood pressure (!) 154/81, pulse 73, temperature 98.4 F (36.9 C), temperature source Oral, resp. rate 20, height 6\' 3"  (1.905 m), weight 90.7 kg, SpO2 90 %.  Intake/Output Summary (Last 24 hours) at 01/29/2020 1039 Last data filed at 01/28/2020 2000 Gross per 24 hour  Intake 440 ml  Output --  Net 440 ml   Filed Weights   01/26/20 1222  Weight: 90.7 kg    Examination:  General appearance: Awake alert.  In no distress Resp: Normal effort at rest.  Exertional tachypnea noted.  Coarse breath sound with crackles at the bases bilaterally.  No wheezing or rhonchi.   Cardio: S1-S2 is normal regular.  No S3-S4.  No rubs murmurs or bruit GI: Abdomen is soft.  Nontender nondistended.  Bowel sounds are present normal.  No masses organomegaly Extremities: No edema.  Full range of motion of lower extremities. Neurologic: Alert and oriented x3.  No focal neurological deficits.     CBC: Recent Labs  Lab 01/27/20 0350 01/28/20 0300 01/29/20 0426  WBC 11.7* 11.2* 9.1  NEUTROABS 10.4* 9.4* 6.6  HGB 13.9 13.3 14.4  HCT 39.4 39.1 42.4  MCV 95.4 96.8 97.7  PLT 266 320 0000000   Basic Metabolic Panel: Recent Labs  Lab 01/27/20 0350 01/28/20 0300 01/29/20 0426  NA 137 141 144  K 4.1 4.2 4.5  CL 101 104 106  CO2 23 25 26  GLUCOSE 141* 144* 112*  BUN 24* 34* 32*  CREATININE 0.84 0.82 0.86  CALCIUM 8.8* 8.8* 8.9   GFR: Estimated Creatinine Clearance: 101 mL/min (by C-G formula based on SCr of 0.86 mg/dL).  Liver Function Tests: Recent Labs  Lab 01/26/20 1027 01/27/20 0350 01/28/20 0300 01/29/20 0426  AST 57* 68* 66* 71*  ALT 47* 67* 79* 94*  ALKPHOS 112 119 105 99  BILITOT 1.0 1.0 0.9 0.8  PROT 7.0 6.9 6.5 6.8  ALBUMIN 3.3* 3.0* 3.0* 3.1*    Coagulation Profile: Recent Labs  Lab 01/26/20 1027  INR 1.1   HbA1C: Hgb A1c MFr Bld  Date/Time Value Ref Range Status   11/24/2019 08:26 AM 5.4 4.6 - 6.5 % Final    Comment:    Glycemic Control Guidelines for People with Diabetes:Non Diabetic:  <6%Goal of Therapy: <7%Additional Action Suggested:  >8%      Scheduled Meds: . vitamin C  500 mg Oral Daily  . aspirin  81 mg Oral QHS  . dexamethasone (DECADRON) injection  6 mg Intravenous Q24H  . enoxaparin (LOVENOX) injection  40 mg Subcutaneous Q24H  . mouth rinse  15 mL Mouth Rinse BID  . zinc sulfate  220 mg Oral Daily   Continuous Infusions: . remdesivir 100 mg in NS 100 mL 100 mg (01/29/20 0858)     LOS: 3 days   [place] [facility] Office  [phone] Pager - Text Page per Shea Evans  If 7PM-7AM, please contact night-coverage per Amion 01/29/2020, 10:39 AM

## 2020-01-29 NOTE — Progress Notes (Signed)
SATURATION QUALIFICATIONS: (This note is used to comply with regulatory documentation for home oxygen)  Patient Saturations on Room Air at Rest = 90%  Patient Saturations on Room Air while Ambulating = 86%  Patient Saturations on 4 Liters of oxygen while Ambulating = 92%  Please briefly explain why patient needs home oxygen: Pt desats when ambulating, becoming symptomatic. Pt states "I feel short of breathe"

## 2020-01-30 LAB — CBC WITH DIFFERENTIAL/PLATELET
Abs Immature Granulocytes: 0.39 10*3/uL — ABNORMAL HIGH (ref 0.00–0.07)
Basophils Absolute: 0.1 10*3/uL (ref 0.0–0.1)
Basophils Relative: 2 %
Eosinophils Absolute: 0 10*3/uL (ref 0.0–0.5)
Eosinophils Relative: 0 %
HCT: 43.8 % (ref 39.0–52.0)
Hemoglobin: 14.9 g/dL (ref 13.0–17.0)
Immature Granulocytes: 5 %
Lymphocytes Relative: 19 %
Lymphs Abs: 1.5 10*3/uL (ref 0.7–4.0)
MCH: 33.5 pg (ref 26.0–34.0)
MCHC: 34 g/dL (ref 30.0–36.0)
MCV: 98.4 fL (ref 80.0–100.0)
Monocytes Absolute: 0.9 10*3/uL (ref 0.1–1.0)
Monocytes Relative: 11 %
Neutro Abs: 4.7 10*3/uL (ref 1.7–7.7)
Neutrophils Relative %: 63 %
Platelets: 150 10*3/uL (ref 150–400)
RBC: 4.45 MIL/uL (ref 4.22–5.81)
RDW: 12.7 % (ref 11.5–15.5)
WBC: 7.6 10*3/uL (ref 4.0–10.5)
nRBC: 0 % (ref 0.0–0.2)

## 2020-01-30 LAB — FERRITIN: Ferritin: 480 ng/mL — ABNORMAL HIGH (ref 24–336)

## 2020-01-30 LAB — COMPREHENSIVE METABOLIC PANEL
ALT: 109 U/L — ABNORMAL HIGH (ref 0–44)
AST: 67 U/L — ABNORMAL HIGH (ref 15–41)
Albumin: 3.2 g/dL — ABNORMAL LOW (ref 3.5–5.0)
Alkaline Phosphatase: 100 U/L (ref 38–126)
Anion gap: 9 (ref 5–15)
BUN: 31 mg/dL — ABNORMAL HIGH (ref 8–23)
CO2: 27 mmol/L (ref 22–32)
Calcium: 9 mg/dL (ref 8.9–10.3)
Chloride: 106 mmol/L (ref 98–111)
Creatinine, Ser: 0.87 mg/dL (ref 0.61–1.24)
GFR calc Af Amer: >60 mL/min (ref >60)
GFR calc non Af Amer: >60 mL/min (ref >60)
Glucose, Bld: 111 mg/dL — ABNORMAL HIGH (ref 70–99)
Potassium: 4.5 mmol/L (ref 3.5–5.1)
Sodium: 142 mmol/L (ref 135–145)
Total Bilirubin: 0.8 mg/dL (ref 0.3–1.2)
Total Protein: 6.7 g/dL (ref 6.5–8.1)

## 2020-01-30 LAB — C-REACTIVE PROTEIN: CRP: 5.6 mg/dL — ABNORMAL HIGH (ref <1.0)

## 2020-01-30 LAB — HEPATITIS B SURFACE ANTIBODY, QUANTITATIVE: Hep B S AB Quant (Post): <3.1 m[IU]/mL — ABNORMAL LOW (ref >9.9)

## 2020-01-30 LAB — D-DIMER, QUANTITATIVE: D-Dimer, Quant: 1.22 ug/mL-FEU — ABNORMAL HIGH (ref 0.00–0.50)

## 2020-01-30 MED ORDER — FUROSEMIDE 10 MG/ML IJ SOLN
40.0000 mg | Freq: Once | INTRAMUSCULAR | Status: AC
  Administered 2020-01-30: 40 mg via INTRAVENOUS
  Filled 2020-01-30: qty 4

## 2020-01-30 NOTE — Progress Notes (Signed)
Christopher Santos  [id number] DOB: [birth year] DOA: 01/26/2020 PCP: Colon Branch, MD    Brief Narrative:  69 year old with a history of HLD who presented to the [facility], ED with complaints of generalized weakness, headache, shortness of breath, and chills for a week.  His sister and all have recently tested positive for Covid.  In the ED he was found to have a saturation of 80% on room air and required 4 L of oxygen support.  CXR noted a right sided pulmonary infiltrate.  He was dosed with Decadron, remdesivir, and Actemra while still in the ED.  Significant Events: 2/1 admit to [facility] via [place] ED  COVID-19 specific Treatment: Decadron 2/1 > Remdesivir 2/1 > Actemra 2/1 Convalescent plasma 2/2  Antimicrobials:  None  Subjective: Patient states that he is slightly better.  Still tends to get short of breath with minimal exertion.  No chest pain.  Occasional cough.  Assessment & Plan:  Pneumonia due to COVID-19/acute respiratory failure with hypoxia Patient seems to be improving gradually.  He needs oxygen mainly with exertion.  Still symptomatic to some extent.  He remains on steroids and remdesivir.  He will complete course of remdesivir today.  His inflammatory markers have been improving.  D-dimer is stable.  Give another dose of Lasix today.  Continue to mobilize.  Incentive spirometry.  Out of bed to chair.    Recent Labs  Lab 01/26/20 1027 01/27/20 0350 01/28/20 0300 01/29/20 0426 01/30/20 0408  DDIMER 1.57* 1.20* 1.16* 1.18* 1.22*  FERRITIN 869* 1,024* 1,089* 685* 480*  CRP 22.8* 25.5* 15.4* 9.0* 5.6*  ALT 47* 67* 79* 94* 109*  PROCALCITON 0.22  --   --   --   --     Lactic acidosis Resolved  Hyperlipidemia Holding statin due to elevated LFTs.  Transaminitis Due to COVID-19.  Monitor periodically.  DVT prophylaxis: Lovenox Code Status: FULL CODE Family Communication: Discussed with patient. Disposition Plan: Hopefully discharge tomorrow.   Lasix x1 today.  Consultants:  none  Objective: Blood pressure (!) 144/75, pulse (!) 55, temperature 98 F (36.7 C), temperature source Axillary, resp. rate 20, height 6\' 3"  (1.905 m), weight 90.7 kg, SpO2 93 %. No intake or output data in the 24 hours ending 01/30/20 1148 Filed Weights   01/26/20 1222  Weight: 90.7 kg    Examination:  General appearance: Awake alert.  In no distress Resp: Normal effort at rest.  Exertional tachypnea noted.  Crackles at the bases bilaterally.  No wheezing or rhonchi. Cardio: S1-S2 is normal regular.  No S3-S4.  No rubs murmurs or bruit GI: Abdomen is soft.  Nontender nondistended.  Bowel sounds are present normal.  No masses organomegaly Extremities: No edema.  Full range of motion of lower extremities. Neurologic: Alert and oriented x3.  No focal neurological deficits.      CBC: Recent Labs  Lab 01/28/20 0300 01/29/20 0426 01/30/20 0408  WBC 11.2* 9.1 7.6  NEUTROABS 9.4* 6.6 4.7  HGB 13.3 14.4 14.9  HCT 39.1 42.4 43.8  MCV 96.8 97.7 98.4  PLT 320 293 [id number]   Basic Metabolic Panel: Recent Labs  Lab 01/28/20 0300 01/29/20 0426 01/30/20 0408  NA 141 144 142  K 4.2 4.5 4.5  CL 104 106 106  CO2 25 26 27   GLUCOSE 144* 112* 111*  BUN 34* 32* 31*  CREATININE 0.82 0.86 0.87  CALCIUM 8.8* 8.9 9.0   GFR: Estimated Creatinine Clearance: 99.8 mL/min (by C-G formula based  on SCr of 0.87 mg/dL).  Liver Function Tests: Recent Labs  Lab 01/27/20 0350 01/28/20 0300 01/29/20 0426 01/30/20 0408  AST 68* 66* 71* 67*  ALT 67* 79* 94* 109*  ALKPHOS 119 105 99 100  BILITOT 1.0 0.9 0.8 0.8  PROT 6.9 6.5 6.8 6.7  ALBUMIN 3.0* 3.0* 3.1* 3.2*    Coagulation Profile: Recent Labs  Lab 01/26/20 1027  INR 1.1   HbA1C: Hgb A1c MFr Bld  Date/Time Value Ref Range Status  11/24/2019 08:26 AM 5.4 4.6 - 6.5 % Final    Comment:    Glycemic Control Guidelines for People with Diabetes:Non Diabetic:  <6%Goal of Therapy: <7%Additional Action  Suggested:  >8%      Scheduled Meds: . vitamin C  500 mg Oral Daily  . aspirin  81 mg Oral QHS  . dexamethasone (DECADRON) injection  6 mg Intravenous Q24H  . enoxaparin (LOVENOX) injection  40 mg Subcutaneous Q24H  . mouth rinse  15 mL Mouth Rinse BID  . zinc sulfate  220 mg Oral Daily   Continuous Infusions:    LOS: 4 days   [place] [facility] Office  [phone] Pager - Text Page per Shea Evans  If 7PM-7AM, please contact night-coverage per Amion 01/30/2020, 11:48 AM

## 2020-01-31 LAB — CBC WITH DIFFERENTIAL/PLATELET
Abs Immature Granulocytes: 0.47 10*3/uL — ABNORMAL HIGH (ref 0.00–0.07)
Basophils Absolute: 0 10*3/uL (ref 0.0–0.1)
Basophils Relative: 0 %
Eosinophils Absolute: 0.1 10*3/uL (ref 0.0–0.5)
Eosinophils Relative: 1 %
HCT: 45.2 % (ref 39.0–52.0)
Hemoglobin: 15 g/dL (ref 13.0–17.0)
Immature Granulocytes: 6 %
Lymphocytes Relative: 20 %
Lymphs Abs: 1.6 10*3/uL (ref 0.7–4.0)
MCH: 32.8 pg (ref 26.0–34.0)
MCHC: 33.2 g/dL (ref 30.0–36.0)
MCV: 98.9 fL (ref 80.0–100.0)
Monocytes Absolute: 0.9 10*3/uL (ref 0.1–1.0)
Monocytes Relative: 10 %
Neutro Abs: 5.2 10*3/uL (ref 1.7–7.7)
Neutrophils Relative %: 63 %
Platelets: 262 10*3/uL (ref 150–400)
RBC: 4.57 MIL/uL (ref 4.22–5.81)
RDW: 12.5 % (ref 11.5–15.5)
WBC: 8.3 10*3/uL (ref 4.0–10.5)
nRBC: 0 % (ref 0.0–0.2)

## 2020-01-31 LAB — CULTURE, BLOOD (ROUTINE X 2)
Culture: NO GROWTH
Culture: NO GROWTH
Special Requests: ADEQUATE

## 2020-01-31 LAB — COMPREHENSIVE METABOLIC PANEL
ALT: 123 U/L — ABNORMAL HIGH (ref 0–44)
AST: 66 U/L — ABNORMAL HIGH (ref 15–41)
Albumin: 3.3 g/dL — ABNORMAL LOW (ref 3.5–5.0)
Alkaline Phosphatase: 92 U/L (ref 38–126)
Anion gap: 15 (ref 5–15)
BUN: 31 mg/dL — ABNORMAL HIGH (ref 8–23)
CO2: 25 mmol/L (ref 22–32)
Calcium: 8.4 mg/dL — ABNORMAL LOW (ref 8.9–10.3)
Chloride: 98 mmol/L (ref 98–111)
Creatinine, Ser: 0.96 mg/dL (ref 0.61–1.24)
GFR calc Af Amer: >60 mL/min (ref >60)
GFR calc non Af Amer: >60 mL/min (ref >60)
Glucose, Bld: 114 mg/dL — ABNORMAL HIGH (ref 70–99)
Potassium: 4.9 mmol/L (ref 3.5–5.1)
Sodium: 138 mmol/L (ref 135–145)
Total Bilirubin: 0.1 mg/dL — ABNORMAL LOW (ref 0.3–1.2)
Total Protein: 6.8 g/dL (ref 6.5–8.1)

## 2020-01-31 LAB — C-REACTIVE PROTEIN: CRP: 3.3 mg/dL — ABNORMAL HIGH (ref <1.0)

## 2020-01-31 LAB — D-DIMER, QUANTITATIVE: D-Dimer, Quant: 1.29 ug/mL-FEU — ABNORMAL HIGH (ref 0.00–0.50)

## 2020-01-31 LAB — FERRITIN: Ferritin: 435 ng/mL — ABNORMAL HIGH (ref 24–336)

## 2020-01-31 MED ORDER — ZINC SULFATE 220 (50 ZN) MG PO CAPS: 220.0000 mg | ORAL_CAPSULE | Freq: Every day | ORAL | 0 refills | Status: AC

## 2020-01-31 MED ORDER — ASCORBIC ACID 500 MG PO TABS: 500.0000 mg | ORAL_TABLET | Freq: Every day | ORAL | 0 refills | Status: AC

## 2020-01-31 MED ORDER — FAMOTIDINE 20 MG PO TABS: 20.0000 mg | ORAL_TABLET | Freq: Every day | ORAL | 0 refills | Status: DC

## 2020-01-31 MED ORDER — DEXAMETHASONE 2 MG PO TABS: ORAL_TABLET | ORAL | 0 refills | Status: DC

## 2020-01-31 NOTE — Progress Notes (Signed)
Physical Therapy Treatment Patient Details Name: Christopher Santos MRN: [id number] DOB: 24-Mar-1951 Today's Date: 01/31/2020    History of Present Illness 69 Year old male admitted as transfer from [place] , with COVID and assoiciated pneumonia. PMH hyperlipdemia. Nonsmoker- moderate use of alcohol. According to patient, several family members have [place], and his spouse is being tested today.    PT Comments    Pt making great progress with mobility able to complete all room level activities with modified to independent level. Ambulated in hall approx 425ft with no AD and mod I, on room air, min desat to 87% no frank respiratory distress noted pt able to ambulate and carry on conversation w/ no difficulties.     Follow Up Recommendations  No PT follow up     Equipment Recommendations  None recommended by PT    Recommendations for Other Services       Precautions / Restrictions Precautions Precautions: None Restrictions Weight Bearing Restrictions: No    Mobility  Bed Mobility Overal bed mobility: Independent                Transfers Overall transfer level: Modified independent Equipment used: None Transfers: Sit to/from [employer] Transfers Sit to Stand: Modified independent (Device/Increase time) Stand pivot transfers: Modified independent (Device/Increase time)          Ambulation/Gait Ambulation/Gait assistance: Supervision;Modified independent (Device/Increase time) Gait Distance (Feet): 400 Feet Assistive device: None Gait Pattern/deviations: WFL(Within Functional Limits) Gait velocity: WFL Gait velocity interpretation: >4.37 ft/sec, indicative of normal walking speed General Gait Details: ambulated on room air min desat 87%   Stairs             Wheelchair Mobility    Modified Rankin (Stroke Patients Only)       Balance Overall balance assessment: No apparent balance deficits (not formally assessed)                                          Cognition Arousal/Alertness: Awake/alert Behavior During Therapy: WFL for tasks assessed/performed Overall Cognitive Status: Within Functional Limits for tasks assessed                                        Exercises      General Comments        Pertinent Vitals/Pain Pain Assessment: No/denies pain    Home Living                      Prior Function            PT Goals (current goals can now be found in the care plan section) Acute Rehab PT Goals Patient Stated Goal: no new complaints excited may be going home and will get to shower in his own bathroom PT Goal Formulation: With patient Time For Goal Achievement: 02/10/20 Potential to Achieve Goals: Good Progress towards PT goals: Progressing toward goals    Frequency    Min 3X/week      PT Plan Current plan remains appropriate    Co-evaluation              AM-PAC PT "6 Clicks" Mobility   Outcome Measure  Help needed turning from your back to your side while in a flat bed without using bedrails?: None  Help needed moving from lying on your back to sitting on the side of a flat bed without using bedrails?: None Help needed moving to and from a bed to a chair (including a wheelchair)?: None Help needed standing up from a chair using your arms (e.g., wheelchair or bedside chair)?: None Help needed to walk in hospital room?: None Help needed climbing 3-5 steps with a railing? : A Little 6 Click Score: 23    End of Session   Activity Tolerance: Patient tolerated treatment well Patient left: in chair;with call bell/phone within reach Nurse Communication: Mobility status PT Visit Diagnosis: Muscle weakness (generalized) (M62.81)     Time: [id number] PT Time Calculation (min) (ACUTE ONLY): 11 min  Charges:  $Gait Training: 8-22 mins                     Horald Chestnut, PT    Delford Field 01/31/2020, 9:30 AM

## 2020-01-31 NOTE — Plan of Care (Signed)
Patient educated on AVS and d/c instructions with all questions answered. Educated on home O2 and O2 safety/use. Patient has all belongings in possession. Left via w/c in private vehicle. IV removed without complication prior to d/c.  Problem: Education: Goal: Knowledge of risk factors and measures for prevention of condition will improve Outcome: Adequate for Discharge   Problem: Coping: Goal: Psychosocial and spiritual needs will be supported Outcome: Adequate for Discharge   Problem: Respiratory: Goal: Will maintain a patent airway Outcome: Adequate for Discharge Goal: Complications related to the disease process, condition or treatment will be avoided or minimized Outcome: Adequate for Discharge

## 2020-01-31 NOTE — Discharge Summary (Signed)
[facility]  Physician Discharge Summary   Patient ID: Christopher Santos MRN: [id number] DOB/AGE: 25-May-1951 69 y.o.  Admit date: 01/26/2020 Discharge date: 01/31/2020  PCP: Colon Branch, MD  DISCHARGE DIAGNOSES:  Pneumonia due to COVID-19 Acute respiratory failure with hypoxia Transaminitis  RECOMMENDATIONS FOR OUTPATIENT FOLLOW UP: 1. Home oxygen has been ordered 2. Ambulatory referral to pulmonology    Home Health: None Equipment/Devices: Home oxygen  CODE STATUS: Full code  DISCHARGE CONDITION: fair  Diet recommendation: As before  INITIAL HISTORY: 69 year old with a history of HLD who presented to the [facility], ED with complaints of generalized weakness, headache, shortness of breath, and chills for a week.  His sister and all have recently tested positive for Covid.  In the ED he was found to have a saturation of 80% on room air and required 4 L of oxygen support.  CXR noted a right sided pulmonary infiltrate.  He was dosed with Decadron, remdesivir, and Actemra while still in the ED.   HOSPITAL COURSE:   Pneumonia due to COVID-19/acute respiratory failure with hypoxia Patient was hospitalized.  Initially was requiring 6 L of oxygen.  His inflammatory markers were significantly elevated.  Patient was given Actemra and convalescent plasma apart from remdesivir and steroids.  He slowly started improving.  He completed his course of remdesivir.  He has been ambulating however does experience some desaturation with exertion.  Home oxygen has been ordered.  Patient feels better.  Okay for discharge home today.    Lactic acidosis Resolved  Hyperlipidemia  Transaminitis Due to COVID-19.    Overall stable.  Okay for discharge home today.   PERTINENT LABS:  The results of significant diagnostics from this hospitalization (including imaging, microbiology, ancillary and laboratory) are listed below for reference.    Microbiology: Recent Results (from the  past 240 hour(s))  Culture, blood (Routine x 2)     Status: None   Collection Time: 01/26/20 10:28 AM   Specimen: BLOOD  Result Value Ref Range Status   Specimen Description   Final    BLOOD LEFT ANTECUBITAL Performed at [facility] [address], [place], [place] [address]    Special Requests   Final    BOTTLES DRAWN AEROBIC AND ANAEROBIC Blood Culture adequate volume Performed at [facility] [address], [place], [place] [address]    Culture   Final    NO GROWTH 5 DAYS Performed at [facility], [place] [address], [place], [place] [address]    Report Status 01/31/2020 FINAL  Final  Culture, blood (Routine x 2)     Status: None   Collection Time: 01/26/20 10:28 AM   Specimen: BLOOD  Result Value Ref Range Status   Specimen Description   Final    BLOOD RIGHT WRIST Performed at [facility] [address], [place], [place] [address]    Special Requests   Final    BOTTLES DRAWN AEROBIC AND ANAEROBIC Blood Culture results may not be optimal due to an inadequate volume of blood received in culture bottles Performed at [facility] [address], [place], [place] [address]    Culture   Final    NO GROWTH 5 DAYS Performed at [facility], [place] [address] [place], [place] [address]    Report Status 01/31/2020 FINAL  Final     Labs:  [id number] Labs  Recent Labs    01/29/20 [id number] 01/30/20 0408 01/31/20 0250  DDIMER 1.18* 1.22* 1.29*  FERRITIN 685* 480* 435*  CRP 9.0* 5.6* 3.3*      Basic Metabolic Panel: Recent Labs  Lab 01/27/20 0350 01/28/20 0300 01/29/20 0426 01/30/20 0408 01/31/20 0250  NA 137 141 144 142 138  K 4.1 4.2 4.5 4.5 4.9  CL 101 104 106 106 98  CO2 23 25 26 27 25   GLUCOSE 141* 144* 112* 111* 114*  BUN 24* 34* 32* 31* 31*  CREATININE 0.84 0.82 0.86 0.87 0.96  CALCIUM 8.8* 8.8* 8.9 9.0 8.4*   Liver Function Tests: Recent Labs  Lab 01/27/20 0350  01/28/20 0300 01/29/20 0426 01/30/20 0408 01/31/20 0250  AST 68* 66* 71* 67* 66*  ALT 67* 79* 94* 109* 123*  ALKPHOS 119 105 99 100 92  BILITOT 1.0 0.9 0.8 0.8 0.1*  PROT 6.9 6.5 6.8 6.7 6.8  ALBUMIN 3.0* 3.0* 3.1* 3.2* 3.3*   CBC: Recent Labs  Lab 01/27/20 0350 01/28/20 0300 01/29/20 0426 01/30/20 0408 01/31/20 0250  WBC 11.7* 11.2* 9.1 7.6 8.3  NEUTROABS 10.4* 9.4* 6.6 4.7 5.2  HGB 13.9 13.3 14.4 14.9 15.0  HCT 39.4 39.1 42.4 43.8 45.2  MCV 95.4 96.8 97.7 98.4 98.9  PLT 266 320 293 150 262     IMAGING STUDIES DG Chest Port 1 View  Result Date: 01/27/2020 CLINICAL DATA:  Pneumonia. EXAM: PORTABLE CHEST 1 VIEW COMPARISON:  01/26/2020. FINDINGS: Borderline cardiomegaly. Diffuse bilateral interstitial prominence, right side greater than left, again noted. Interim slight improvement in aeration from prior exam. No pleural effusion or pneumothorax. IMPRESSION: Borderline cardiomegaly. 2. Diffuse bilateral interstitial prominence consistent with interstitial edema and/or pneumonitis, right side greater than left, again noted. Interim slight improvement from prior exam. Electronically Signed   By: [place]   On: 01/27/2020 08:49   DG Chest Portable 1 View  Result Date: 01/26/2020 CLINICAL DATA:  Shortness of breath, cough. EXAM: PORTABLE CHEST 1 VIEW COMPARISON:  None. FINDINGS: The heart size and mediastinal contours are within normal limits. No pneumothorax or pleural effusion is noted. Large right lung opacity is noted concerning for pneumonia. Mild left basilar atelectasis or infiltrate is noted. The visualized skeletal structures are unremarkable. IMPRESSION: Large right lung opacity is noted concerning for pneumonia. Follow-up radiographs are recommended to ensure resolution. Mild left basilar atelectasis or infiltrate is noted as well. Electronically Signed   By: Marijo Conception M.D.   On: 01/26/2020 11:34    DISCHARGE EXAMINATION: Vitals:   01/30/20 1543 01/30/20 1900  01/31/20 0300 01/31/20 0730  BP: 123/75 132/81 122/86 (!) 141/81  Pulse: 65 73 68 63  Resp: 18 18 16 18   Temp: 97.7 F (36.5 C) 97.6 F (36.4 C) (!) 97 F (36.1 C) 98 F (36.7 C)  TempSrc: Oral Oral Oral Oral  SpO2: 94% 94% 92% 91%  Weight:      Height:       General appearance: Awake alert.  In no distress Resp: Normal effort at rest.  Few crackles at the bases bilaterally. Cardio: S1-S2 is normal regular.  No S3-S4.  No rubs murmurs or bruit GI: Abdomen is soft.  Nontender nondistended.  Bowel sounds are present normal.  No masses organomegaly Extremities: No edema.  Full range of motion of lower extremities. Neurologic: Alert and oriented x3.  No focal neurological deficits.    DISPOSITION: Home  Discharge Instructions    Ambulatory referral to Pulmonology   Complete by: As directed    Follow-up for COVID-19 in 3 to 4  weeks.  Home oxygen.   Call MD for:  difficulty breathing, headache or visual disturbances   Complete by: As directed    Call MD for:  extreme fatigue   Complete by: As directed    Call MD for:  persistant dizziness or light-headedness   Complete by: As directed    Call MD for:  persistant nausea and vomiting   Complete by: As directed    Call MD for:  severe uncontrolled pain   Complete by: As directed    Call MD for:  temperature >100.4   Complete by: As directed    Discharge instructions   Complete by: As directed    Please take your medications as prescribed.  Follow-up with your primary care provider.  COVID 19 INSTRUCTIONS  - You are felt to be stable enough to no longer require inpatient monitoring, testing, and treatment, though you will need to follow the recommendations below: - Based on the CDC's non-test criteria for ending self-isolation: You may not return to work/leave the home until at least 21 days since symptom onset AND 3 days without a fever (without taking tylenol, ibuprofen, etc.) AND have improvement in respiratory symptoms. - Do  not take NSAID medications (including, but not limited to, ibuprofen, advil, motrin, naproxen, aleve, goody's powder, etc.) - Follow up with your doctor in the next week via telehealth or seek medical attention right away if your symptoms get WORSE.  - Consider donating plasma after you have recovered (either 14 days after a negative test or 28 days after symptoms have completely resolved) because your antibodies to this virus may be helpful to give to others with life-threatening infections. Please go to the website www.[email or web address] if you would like to consider volunteering for plasma donation.    Directions for you at home:  Wear a facemask You should wear a facemask that covers your nose and mouth when you are in the same room with other people and when you visit a healthcare provider. People who live with or visit you should also wear a facemask while they are in the same room with you.  Separate yourself from other people in your home As much as possible, you should stay in a different room from other people in your home. Also, you should use a separate bathroom, if available.  Avoid sharing household items You should not share dishes, drinking glasses, cups, eating utensils, towels, bedding, or other items with other people in your home. After using these items, you should wash them thoroughly with soap and water.  Cover your coughs and sneezes Cover your mouth and nose with a tissue when you cough or sneeze, or you can cough or sneeze into your sleeve. Throw used tissues in a lined trash can, and immediately wash your hands with soap and water for at least 20 seconds or use an alcohol-based hand rub.  Wash your [facility] your hands often and thoroughly with soap and water for at least 20 seconds. You can use an alcohol-based hand sanitizer if soap and water are not available and if your hands are not visibly dirty. Avoid touching your eyes, nose, and mouth with unwashed  hands.  Directions for those who live with, or provide care at home for you:  Limit the number of people who have contact with the patient If possible, have only one caregiver for the patient. Other household members should stay in another home or place of residence. If this is not possible, they should stay  in another room, or be separated from the patient as much as possible. Use a separate bathroom, if available. Restrict visitors who do not have an essential need to be in the home.  Ensure good ventilation Make sure that shared spaces in the home have good air flow, such as from an air conditioner or an opened window, weather permitting.  Wash your hands often Wash your hands often and thoroughly with soap and water for at least 20 seconds. You can use an alcohol based hand sanitizer if soap and water are not available and if your hands are not visibly dirty. Avoid touching your eyes, nose, and mouth with unwashed hands. Use disposable paper towels to dry your hands. If not available, use dedicated cloth towels and replace them when they become wet.  Wear a facemask and gloves Wear a disposable facemask at all times in the room and gloves when you touch or have contact with the patient's blood, body fluids, and/or secretions or excretions, such as sweat, saliva, sputum, nasal mucus, vomit, urine, or feces.  Ensure the mask fits over your nose and mouth tightly, and do not touch it during use. Throw out disposable facemasks and gloves after using them. Do not reuse. Wash your hands immediately after removing your facemask and gloves. If your personal clothing becomes contaminated, carefully remove clothing and launder. Wash your hands after handling contaminated clothing. Place all used disposable facemasks, gloves, and other waste in a lined container before disposing them with other household waste. Remove gloves and wash your hands immediately after handling these items.  Do not  share dishes, glasses, or other household items with the patient Avoid sharing household items. You should not share dishes, drinking glasses, cups, eating utensils, towels, bedding, or other items with a patient who is confirmed to have, or being evaluated for, COVID-19 infection. After the person uses these items, you should wash them thoroughly with soap and water.  Wash laundry thoroughly Immediately remove and wash clothes or bedding that have blood, body fluids, and/or secretions or excretions, such as sweat, saliva, sputum, nasal mucus, vomit, urine, or feces, on them. Wear gloves when handling laundry from the patient. Read and follow directions on labels of laundry or clothing items and detergent. In general, wash and dry with the warmest temperatures recommended on the label.  Clean all areas the individual has used often Clean all touchable surfaces, such as counters, tabletops, doorknobs, bathroom fixtures, toilets, phones, keyboards, tablets, and bedside tables, every day. Also, clean any surfaces that may have blood, body fluids, and/or secretions or excretions on them. Wear gloves when cleaning surfaces the patient has come in contact with. Use a diluted bleach solution (e.g., dilute bleach with 1 part bleach and 10 parts water) or a household disinfectant with a label that says EPA-registered for coronaviruses. To make a bleach solution at home, add 1 tablespoon of bleach to 1 quart (4 cups) of water. For a larger supply, add  cup of bleach to 1 gallon (16 cups) of water. Read labels of cleaning products and follow recommendations provided on product labels. Labels contain instructions for safe and effective use of the cleaning product including precautions you should take when applying the product, such as wearing gloves or eye protection and making sure you have good ventilation during use of the product. Remove gloves and wash hands immediately after cleaning.  Monitor yourself  for signs and symptoms of illness Caregivers and household members are considered close contacts, should monitor their  health, and will be asked to limit movement outside of the home to the extent possible. Follow the monitoring steps for close contacts listed on the symptom monitoring form.   If you have additional questions, contact your local health department or call the epidemiologist on call at [phone] (available 24/7). This guidance is subject to change. For the most up-to-date guidance from [facility], please refer to their website: YouBlogs.pl   You were cared for by a hospitalist during your hospital stay. If you have any questions about your discharge medications or the care you received while you were in the hospital after you are discharged, you can call the unit and asked to speak with the hospitalist on call if the hospitalist that took care of you is not available. Once you are discharged, your primary care physician will handle any further medical issues. Please note that NO REFILLS for any discharge medications will be authorized once you are discharged, as it is imperative that you return to your primary care physician (or establish a relationship with a primary care physician if you do not have one) for your aftercare needs so that they can reassess your need for medications and monitor your lab values. If you do not have a primary care physician, you can call [phone] for a physician referral.   Increase activity slowly   Complete by: As directed         Allergies as of 01/31/2020   No Known Allergies     Medication List    STOP taking these medications   valACYclovir 1000 MG tablet Commonly known as: VALTREX     TAKE these medications   acetaminophen 325 MG tablet Commonly known as: TYLENOL Take by mouth every 6 (six) hours as needed for fever.   Advil 200 MG tablet Generic drug: ibuprofen Take 400  mg by mouth every 6 (six) hours as needed for fever or headache.   ascorbic acid 500 MG tablet Commonly known as: VITAMIN C Take 1 tablet (500 mg total) by mouth daily for 10 days. Start taking on: February 01, 2020   aspirin 81 MG tablet Take 81 mg by mouth at bedtime.   atorvastatin 40 MG tablet Commonly known as: LIPITOR Take 1 tablet (40 mg total) by mouth at bedtime.   dexamethasone 2 MG tablet Commonly known as: DECADRON Take 2 tablets once daily for 3 days, then 1 tablet once daily for 3 days, then STOP.   famotidine 20 MG tablet Commonly known as: Pepcid Take 1 tablet (20 mg total) by mouth daily for 10 days.   zinc sulfate 220 (50 Zn) MG capsule Take 1 capsule (220 mg total) by mouth daily for 10 days. Start taking on: February 01, 2020            Durable Medical Equipment  (From admission, onward)         Start     Ordered   01/31/20 1002  For home use only DME oxygen  Once    Question Answer Comment  Length of Need 6 Months   Mode or (Route) Nasal cannula   Liters per Minute 2   Frequency Continuous (stationary and portable oxygen unit needed)   Oxygen conserving device Yes   Oxygen delivery system Gas      01/31/20 1002           Follow-up Information    Colon Branch, MD. Schedule an appointment as soon as possible for a visit in 1 week(s).  Specialty: Internal Medicine Contact information: [address] [place] [address] [phone]        [place]. Call.   Why: For questions or concerns about oxygen [facility] [address], [place] [address] [phone]          TOTAL DISCHARGE TIME: 60 minutes  [place]  [facility] Pager on [email or web address]  01/31/2020, 1:29 PM

## 2020-01-31 NOTE — Discharge Instructions (Signed)
COVID-19 COVID-19 is a respiratory infection that is caused by a virus called severe acute respiratory syndrome coronavirus 2 (SARS-CoV-2). The disease is also known as coronavirus disease or novel coronavirus. In some people, the virus may not cause any symptoms. In others, it may cause a serious infection. The infection can get worse quickly and can lead to complications, such as:  Pneumonia, or infection of the lungs.  Acute respiratory distress syndrome or ARDS. This is a condition in which fluid build-up in the lungs prevents the lungs from filling with air and passing oxygen into the blood.  Acute respiratory failure. This is a condition in which there is not enough oxygen passing from the lungs to the body or when carbon dioxide is not passing from the lungs out of the body.  Sepsis or septic shock. This is a serious bodily reaction to an infection.  Blood clotting problems.  Secondary infections due to bacteria or fungus.  Organ failure. This is when your body's organs stop working. The virus that causes COVID-19 is contagious. This means that it can spread from person to person through droplets from coughs and sneezes (respiratory secretions). What are the causes? This illness is caused by a virus. You may catch the virus by:  Breathing in droplets from an infected person. Droplets can be spread by a person breathing, speaking, singing, coughing, or sneezing.  Touching something, like a table or a doorknob, that was exposed to the virus (contaminated) and then touching your mouth, nose, or eyes. What increases the risk? Risk for infection You are more likely to be infected with this virus if you:  Are within 6 feet (2 meters) of a person with COVID-19.  Provide care for or live with a person who is infected with COVID-19.  Spend time in crowded indoor spaces or live in shared housing. Risk for serious illness You are more likely to become seriously ill from the virus if you:   Are 50 years of age or older. The higher your age, the more you are at risk for serious illness.  Live in a nursing home or long-term care facility.  Have cancer.  Have a long-term (chronic) disease such as: ? Chronic lung disease, including chronic obstructive pulmonary disease or asthma. ? A long-term disease that lowers your body's ability to fight infection (immunocompromised). ? Heart disease, including heart failure, a condition in which the arteries that lead to the heart become narrow or blocked (coronary artery disease), a disease which makes the heart muscle thick, weak, or stiff (cardiomyopathy). ? Diabetes. ? Chronic kidney disease. ? Sickle cell disease, a condition in which red blood cells have an abnormal "sickle" shape. ? Liver disease.  Are obese. What are the signs or symptoms? Symptoms of this condition can range from mild to severe. Symptoms may appear any time from 2 to 14 days after being exposed to the virus. They include:  A fever or chills.  A cough.  Difficulty breathing.  Headaches, body aches, or muscle aches.  Runny or stuffy (congested) nose.  A sore throat.  New loss of taste or smell. Some people may also have stomach problems, such as nausea, vomiting, or diarrhea. Other people may not have any symptoms of COVID-19. How is this diagnosed? This condition may be diagnosed based on:  Your signs and symptoms, especially if: ? You live in an area with a COVID-19 outbreak. ? You recently traveled to or from an area where the virus is common. ? You   provide care for or live with a person who was diagnosed with COVID-19. ? You were exposed to a person who was diagnosed with COVID-19.  A physical exam.  Lab tests, which may include: ? Taking a sample of fluid from the back of your nose and throat (nasopharyngeal fluid), your nose, or your throat using a swab. ? A sample of mucus from your lungs (sputum). ? Blood tests.  Imaging tests, which  may include, X-rays, CT scan, or ultrasound. How is this treated? At present, there is no medicine to treat COVID-19. Medicines that treat other diseases are being used on a trial basis to see if they are effective against COVID-19. Your health care provider will talk with you about ways to treat your symptoms. For most people, the infection is mild and can be managed at home with rest, fluids, and over-the-counter medicines. Treatment for a serious infection usually takes places in a hospital intensive care unit (ICU). It may include one or more of the following treatments. These treatments are given until your symptoms improve.  Receiving fluids and medicines through an IV.  Supplemental oxygen. Extra oxygen is given through a tube in the nose, a face mask, or a hood.  Positioning you to lie on your stomach (prone position). This makes it easier for oxygen to get into the lungs.  Continuous positive airway pressure (CPAP) or bi-level positive airway pressure (BPAP) machine. This treatment uses mild air pressure to keep the airways open. A tube that is connected to a motor delivers oxygen to the body.  Ventilator. This treatment moves air into and out of the lungs by using a tube that is placed in your windpipe.  Tracheostomy. This is a procedure to create a hole in the neck so that a breathing tube can be inserted.  Extracorporeal membrane oxygenation (ECMO). This procedure gives the lungs a chance to recover by taking over the functions of the heart and lungs. It supplies oxygen to the body and removes carbon dioxide. Follow these instructions at home: Lifestyle  If you are sick, stay home except to get medical care. Your health care provider will tell you how long to stay home. Call your health care provider before you go for medical care.  Rest at home as told by your health care provider.  Do not use any products that contain nicotine or tobacco, such as cigarettes, e-cigarettes, and  chewing tobacco. If you need help quitting, ask your health care provider.  Return to your normal activities as told by your health care provider. Ask your health care provider what activities are safe for you. General instructions  Take over-the-counter and prescription medicines only as told by your health care provider.  Drink enough fluid to keep your urine pale yellow.  Keep all follow-up visits as told by your health care provider. This is important. How is this prevented?  There is no vaccine to help prevent COVID-19 infection. However, there are steps you can take to protect yourself and others from this virus. To protect yourself:   Do not travel to areas where COVID-19 is a risk. The areas where COVID-19 is reported change often. To identify high-risk areas and travel restrictions, check the CDC travel website: wwwnc.cdc.gov/travel/notices  If you live in, or must travel to, an area where COVID-19 is a risk, take precautions to avoid infection. ? Stay away from people who are sick. ? Wash your hands often with soap and water for 20 seconds. If soap and water   are not available, use an alcohol-based hand sanitizer. ? Avoid touching your mouth, face, eyes, or nose. ? Avoid going out in public, follow guidance from your state and local health authorities. ? If you must go out in public, wear a cloth face covering or face mask. Make sure your mask covers your nose and mouth. ? Avoid crowded indoor spaces. Stay at least 6 feet (2 meters) away from others. ? Disinfect objects and surfaces that are frequently touched every day. This may include:  Counters and tables.  Doorknobs and light switches.  Sinks and faucets.  Electronics, such as phones, remote controls, keyboards, computers, and tablets. To protect others: If you have symptoms of COVID-19, take steps to prevent the virus from spreading to others.  If you think you have a COVID-19 infection, contact your health care  provider right away. Tell your health care team that you think you may have a COVID-19 infection.  Stay home. Leave your house only to seek medical care. Do not use public transport.  Do not travel while you are sick.  Wash your hands often with soap and water for 20 seconds. If soap and water are not available, use alcohol-based hand sanitizer.  Stay away from other members of your household. Let healthy household members care for children and pets, if possible. If you have to care for children or pets, wash your hands often and wear a mask. If possible, stay in your own room, separate from others. Use a different bathroom.  Make sure that all people in your household wash their hands well and often.  Cough or sneeze into a tissue or your sleeve or elbow. Do not cough or sneeze into your hand or into the air.  Wear a cloth face covering or face mask. Make sure your mask covers your nose and mouth. Where to find more information  Centers for Disease Control and Prevention: www.cdc.gov/coronavirus/2019-ncov/index.html  World Health Organization: www.who.int/health-topics/coronavirus Contact a health care provider if:  You live in or have traveled to an area where COVID-19 is a risk and you have symptoms of the infection.  You have had contact with someone who has COVID-19 and you have symptoms of the infection. Get help right away if:  You have trouble breathing.  You have pain or pressure in your chest.  You have confusion.  You have bluish lips and fingernails.  You have difficulty waking from sleep.  You have symptoms that get worse. These symptoms may represent a serious problem that is an emergency. Do not wait to see if the symptoms will go away. Get medical help right away. Call your local emergency services (911 in the [place] Do not drive yourself to the hospital. Let the emergency medical personnel know if you think you have COVID-19. Summary  COVID-19 is a  respiratory infection that is caused by a virus. It is also known as coronavirus disease or novel coronavirus. It can cause serious infections, such as pneumonia, acute respiratory distress syndrome, acute respiratory failure, or sepsis.  The virus that causes COVID-19 is contagious. This means that it can spread from person to person through droplets from breathing, speaking, singing, coughing, or sneezing.  You are more likely to develop a serious illness if you are 50 years of age or older, have a weak immune system, live in a nursing home, or have chronic disease.  There is no medicine to treat COVID-19. Your health care provider will talk with you about ways to treat your symptoms.    Take steps to protect yourself and others from infection. Wash your hands often and disinfect objects and surfaces that are frequently touched every day. Stay away from people who are sick and wear a mask if you are sick. This information is not intended to replace advice given to you by your health care provider. Make sure you discuss any questions you have with your health care provider. Document Revised: 10/10/2019 Document Reviewed: 01/16/2019 Elsevier Patient Education  2020 Elsevier Inc.  

## 2020-01-31 NOTE — TOC Transition Note (Signed)
Transition of Care [facility]) - CM/SW Discharge Note   Patient Details  Name: DAXTEN CARDONI MRN: [id number] Date of Birth: February 13, 1951  Transition of Care [facility]) CM/SW Contact:  Bartholomew Crews, RN Phone Number: [phone] 01/31/2020, 11:23 AM   Clinical Narrative:    Notified by bedside RN that patient to transition home today.  Ambulation assessment done - O2 needed for home - order and progress note provided. Spoke with patient on room phone. Referral to [facility], and [facility] notified to deliver portable concentrator to room. Spoke with patient to advise of oxygen to be delivered to room. RN notified. No further TOC needs identified.    Final next level of care: Home/Self Care Barriers to Discharge: No Barriers Identified   Patient Goals and CMS Choice Patient states their goals for this hospitalization and ongoing recovery are:: return home with wife CMS Medicare.gov Compare Post Acute Care list provided to:: Patient Choice offered to / list presented to : Patient  Discharge Placement                       Discharge Plan and Services                DME Arranged: Oxygen DME Agency: [facility] Date DME Agency Contacted: 01/31/20 Time DME Agency Contacted: [id number] Representative spoke with at DME Agency: Thedore Mins HH Arranged: NA [place] Agency: NA        Social Determinants of Health ([place]) Interventions     Readmission Risk Interventions No flowsheet data found.

## 2020-01-31 NOTE — Progress Notes (Signed)
  SATURATION QUALIFICATIONS: (This note is used to comply with regulatory documentation for home oxygen)  Patient Saturations on Room Air at Rest = 97%  Patient Saturations on Room Air while Ambulating = 87%  Patient Saturations on 2 Liters of oxygen while Ambulating = 93%  Please briefly explain why patient needs home oxygen: Patient desaturates with exertion.  Will benefit from oxygen at home to maintain normal saturations while exerting.

## 2020-01-31 NOTE — Progress Notes (Signed)
Pt reassigned to Caryl Never, RN

## 2020-01-31 NOTE — Progress Notes (Signed)
Patient denies pain. No discomfort. No SOB, slept throughout the night.

## 2020-02-02 ENCOUNTER — Telehealth: Payer: Self-pay | Admitting: *Deleted

## 2020-02-02 NOTE — Telephone Encounter (Signed)
Transition Care Management Follow-up Telephone Call   Date discharged?01/31/20   How have you been since you were released from the hospital? "I am doing well"   Do you understand why you were in the hospital? yes   Do you understand the discharge instructions? yes   Where were you discharged to? Home w/ wife   Items Reviewed:  Medications reviewed: yes, they added dexamethasone and famotidine.  Allergies reviewed: yes  Dietary changes reviewed: yes  Referrals reviewed: yes   Functional Questionnaire:   Activities of Daily Living (ADLs):   He states they are independent in the following: ambulation, bathing and hygiene, feeding, continence, grooming, toileting and dressing States they require assistance with the following: na   Any transportation issues/concerns?: no   Any patient concerns? no   Confirmed importance and date/time of follow-up visits scheduled yes  Provider Appointment booked with PCP 02/10/20 @1120  virtually  Confirmed with patient if condition begins to worsen call PCP or go to the ER.  Patient was given the office number and encouraged to call back with question or concerns.  : yes

## 2020-02-10 ENCOUNTER — Encounter: Payer: Self-pay | Admitting: Internal Medicine

## 2020-02-10 ENCOUNTER — Other Ambulatory Visit: Payer: Self-pay

## 2020-02-10 ENCOUNTER — Ambulatory Visit (INDEPENDENT_AMBULATORY_CARE_PROVIDER_SITE_OTHER): Payer: 59 | Admitting: Internal Medicine

## 2020-02-10 VITALS — Ht 75.0 in | Wt 186.0 lb

## 2020-02-10 DIAGNOSIS — J1282 Pneumonia due to coronavirus disease 2019: Secondary | ICD-10-CM

## 2020-02-10 DIAGNOSIS — J9601 Acute respiratory failure with hypoxia: Secondary | ICD-10-CM | POA: Diagnosis not present

## 2020-02-10 DIAGNOSIS — U071 COVID-19: Secondary | ICD-10-CM | POA: Diagnosis not present

## 2020-02-10 NOTE — Progress Notes (Signed)
Pre visit review using our clinic review tool, if applicable. No additional management support is needed unless otherwise documented below in the visit note. 

## 2020-02-10 NOTE — Progress Notes (Signed)
Subjective:    Patient ID: Christopher Santos, male    DOB: 1951-06-12, 69 y.o.   MRN: [id number]  DOS:  02/10/2020 Type of visit - description: Virtual Visit via Video Note  I connected with the above patient  by a video enabled telemedicine application and verified that I am speaking with the correct person using two identifiers.   THIS ENCOUNTER IS A VIRTUAL VISIT DUE TO COVID-19 - PATIENT WAS NOT SEEN IN THE OFFICE. PATIENT HAS CONSENTED TO VIRTUAL VISIT / TELEMEDICINE VISIT   Location of patient: home  Location of provider: office  I discussed the limitations of evaluation and management by telemedicine and the availability of in person appointments. The patient expressed understanding and agreed to proceed.  Hospital follow-up/TCM The patient tested positive for Covid 01/26/2020, he was admitted to the hospital with pneumonia and acute hypoxic respiratory failure requiring up to 6 L of oxygen. He was treated w/ Actemra, convalescent plasma, remdesivir and steroids.  He is slowly improved and was discharged home 01/31/2020.  He is currently doing well. He did not need oxygen shortly after he was discharge from the hospital, O2 sat consistently in the mid 90s. Able to ambulate around his house without shortness of breath. Denies fever chills No chest pain Mild persistent cough present with no sputum No nausea, vomiting, diarrhea No lower extremity edema     Review of Systems See above   Past Medical History:  Diagnosis Date  . Hyperlipemia     Past Surgical History:  Procedure Laterality Date  . TONSILLECTOMY     at age 79    Allergies as of 02/10/2020   No Known Allergies     Medication List       Accurate as of February 10, 2020 11:36 AM. If you have any questions, ask your nurse or doctor.        acetaminophen 325 MG tablet Commonly known as: TYLENOL Take by mouth every 6 (six) hours as needed for fever.   Advil 200 MG tablet Generic drug: ibuprofen Take  400 mg by mouth every 6 (six) hours as needed for fever or headache.   ascorbic acid 500 MG tablet Commonly known as: VITAMIN C Take 1 tablet (500 mg total) by mouth daily for 10 days.   aspirin 81 MG tablet Take 81 mg by mouth at bedtime.   atorvastatin 40 MG tablet Commonly known as: LIPITOR Take 1 tablet (40 mg total) by mouth at bedtime.   dexamethasone 2 MG tablet Commonly known as: DECADRON Take 2 tablets once daily for 3 days, then 1 tablet once daily for 3 days, then STOP.   famotidine 20 MG tablet Commonly known as: Pepcid Take 1 tablet (20 mg total) by mouth daily for 10 days.   zinc sulfate 220 (50 Zn) MG capsule Take 1 capsule (220 mg total) by mouth daily for 10 days.             Objective:   Physical Exam Ht 6\' 3"  (1.905 m)   Wt 186 lb (84.4 kg)   BMI 23.25 kg/m  This was a virtual video visit, he is alert oriented x3, in no apparent distress.  Speaking in complete sentences    Assessment       Assessment > Dyslipidemia L wrist Fx 2015  Fever blisters- valtrex rarely  PLAN: Covid pneumonia with acute hypoxic respiratory failure: Admitted to the hospital and discharged 01/31/2020. He is currently at home, finishing steroids, feeling better, O2  sat in the mid 90s without oxygen supplements. Multiple questions answered to the best of my ability. Recommend to continue strict quarantine until 01/18/2020. Also recommend good hydration, rest. Red flag symptoms discussed, if he start feeling poorly again or has fever, chills, chest pain or difficulty breathing: Needs to let me know immediately otherwise will set up follow-up after 02/18/2020 for checkup in the office. Will need a CMP, CBC and a chest x-ray.   I discussed the assessment and treatment plan with the patient. The patient was provided an opportunity to ask questions and all were answered. The patient agreed with the plan and demonstrated an understanding of the instructions.   The patient was  advised to call back or seek an in-person evaluation if the symptoms worsen or if the condition fails to improve as anticipated.

## 2020-02-11 NOTE — Assessment & Plan Note (Signed)
Covid pneumonia with acute hypoxic respiratory failure: Admitted to the hospital and discharged 01/31/2020. He is currently at home, finishing steroids, feeling better, O2 sat in the mid 90s without oxygen supplements. Multiple questions answered to the best of my ability. Recommend to continue strict quarantine until 01/18/2020. Also recommend good hydration, rest. Red flag symptoms discussed, if he start feeling poorly again or has fever, chills, chest pain or difficulty breathing: Needs to let me know immediately otherwise will set up follow-up after 02/18/2020 for checkup in the office. Will need a CMP, CBC and a chest x-ray.

## 2020-02-16 ENCOUNTER — Encounter: Payer: 59 | Admitting: Internal Medicine

## 2020-02-18 ENCOUNTER — Telehealth: Payer: Self-pay | Admitting: Internal Medicine

## 2020-02-18 NOTE — Telephone Encounter (Signed)
Patient is requesting discontinuing oxygen usage, patient states that he no longer needs the oxygen.   Please have Dr Larose Kells to fax [place] a letter stating that patient can discontinue Oxygen  @ [phone]

## 2020-02-18 NOTE — Telephone Encounter (Signed)
Received fax confirmation

## 2020-02-18 NOTE — Telephone Encounter (Signed)
Please advise 

## 2020-02-18 NOTE — Telephone Encounter (Signed)
Order to d/c oxygen faxed to [facility].

## 2020-02-18 NOTE — Telephone Encounter (Signed)
Okay to stop oxygen.

## 2020-02-20 ENCOUNTER — Encounter: Payer: Self-pay | Admitting: Internal Medicine

## 2020-02-25 ENCOUNTER — Encounter: Payer: Self-pay | Admitting: Internal Medicine

## 2020-03-03 ENCOUNTER — Other Ambulatory Visit: Payer: Self-pay | Admitting: Internal Medicine

## 2020-03-04 ENCOUNTER — Telehealth: Payer: Self-pay | Admitting: Internal Medicine

## 2020-03-10 ENCOUNTER — Ambulatory Visit: Payer: 59 | Admitting: Internal Medicine

## 2020-03-10 ENCOUNTER — Other Ambulatory Visit: Payer: Self-pay

## 2020-03-10 ENCOUNTER — Encounter: Payer: Self-pay | Admitting: Internal Medicine

## 2020-03-10 ENCOUNTER — Ambulatory Visit (HOSPITAL_BASED_OUTPATIENT_CLINIC_OR_DEPARTMENT_OTHER)
Admission: RE | Admit: 2020-03-10 | Discharge: 2020-03-10 | Disposition: A | Discharge status: Home / Self Care | Payer: 59 | Source: Ambulatory Visit | Attending: Internal Medicine | Admitting: Internal Medicine

## 2020-03-10 VITALS — BP 131/90 | HR 69 | Temp 95.6°F | Resp 18 | Ht 75.0 in | Wt 195.2 lb

## 2020-03-10 DIAGNOSIS — U071 COVID-19: Secondary | ICD-10-CM | POA: Diagnosis present

## 2020-03-10 DIAGNOSIS — J1282 Pneumonia due to coronavirus disease 2019: Secondary | ICD-10-CM | POA: Insufficient documentation

## 2020-03-10 DIAGNOSIS — E785 Hyperlipidemia, unspecified: Secondary | ICD-10-CM

## 2020-03-10 LAB — COMPREHENSIVE METABOLIC PANEL
ALT: 16 U/L (ref 0–53)
AST: 17 U/L (ref 0–37)
Albumin: 4.1 g/dL (ref 3.5–5.2)
Alkaline Phosphatase: 81 U/L (ref 39–117)
BUN: 13 mg/dL (ref 6–23)
CO2: 30 mEq/L (ref 19–32)
Calcium: 9.5 mg/dL (ref 8.4–10.5)
Chloride: 105 mEq/L (ref 96–112)
Creatinine, Ser: 0.83 mg/dL (ref 0.40–1.50)
GFR: 92.42 mL/min (ref >60.00)
Glucose, Bld: 100 mg/dL — ABNORMAL HIGH (ref 70–99)
Potassium: 4.6 mEq/L (ref 3.5–5.1)
Sodium: 140 mEq/L (ref 135–145)
Total Bilirubin: 0.8 mg/dL (ref 0.2–1.2)
Total Protein: 6.5 g/dL (ref 6.0–8.3)

## 2020-03-10 LAB — CBC WITH DIFFERENTIAL/PLATELET
Basophils Absolute: 0 10*3/uL (ref 0.0–0.1)
Basophils Relative: 0.8 % (ref 0.0–3.0)
Eosinophils Absolute: 0.2 10*3/uL (ref 0.0–0.7)
Eosinophils Relative: 3.7 % (ref 0.0–5.0)
HCT: 40.1 % (ref 39.0–52.0)
Hemoglobin: 13.9 g/dL (ref 13.0–17.0)
Lymphocytes Relative: 40.3 % (ref 12.0–46.0)
Lymphs Abs: 1.9 10*3/uL (ref 0.7–4.0)
MCHC: 34.6 g/dL (ref 30.0–36.0)
MCV: 97.5 fl (ref 78.0–100.0)
Monocytes Absolute: 0.7 10*3/uL (ref 0.1–1.0)
Monocytes Relative: 14 % — ABNORMAL HIGH (ref 3.0–12.0)
Neutro Abs: 2 10*3/uL (ref 1.4–7.7)
Neutrophils Relative %: 41.2 % — ABNORMAL LOW (ref 43.0–77.0)
Platelets: 364 10*3/uL (ref 150.0–400.0)
RBC: 4.12 Mil/uL — ABNORMAL LOW (ref 4.22–5.81)
RDW: 13.7 % (ref 11.5–15.5)
WBC: 4.8 10*3/uL (ref 4.0–10.5)

## 2020-03-10 LAB — LIPID PANEL
Cholesterol: 140 mg/dL (ref 0–200)
HDL: 33.7 mg/dL — ABNORMAL LOW (ref >39.00)
LDL Cholesterol: 88 mg/dL (ref 0–99)
NonHDL: 106.47
Total CHOL/HDL Ratio: 4
Triglycerides: 94 mg/dL (ref 0.0–149.0)
VLDL: 18.8 mg/dL (ref 0.0–40.0)

## 2020-03-10 NOTE — Progress Notes (Signed)
Subjective:    Patient ID: Christopher Santos, male    DOB: 05-13-1951, 69 y.o.   MRN: [id number]  DOS:  03/10/2020 Type of visit - description: Follow-up The patient was diagnosed with Covid pneumonia / oxygen requiring about 6 weeks ago. Here for follow-up. He is feeling much improved, 95% back to normal. He still has some decreased stamina when he walks but no difficulty breathing per se.  High cholesterol: Switched from simvastatin to Lipitor, needs labs  Review of Systems No recent fever chills No chest pain No cough Past Medical History:  Diagnosis Date  . Hyperlipemia     Past Surgical History:  Procedure Laterality Date  . TONSILLECTOMY     at age 50    Allergies as of 03/10/2020   No Known Allergies     Medication List       Accurate as of March 10, 2020  8:46 AM. If you have any questions, ask your nurse or doctor.        acetaminophen 325 MG tablet Commonly known as: TYLENOL Take by mouth every 6 (six) hours as needed for fever.   Advil 200 MG tablet Generic drug: ibuprofen Take 400 mg by mouth every 6 (six) hours as needed for fever or headache.   aspirin 81 MG tablet Take 81 mg by mouth at bedtime.   atorvastatin 40 MG tablet Commonly known as: LIPITOR TAKE 1 TABLET BY MOUTH EVERYDAY AT BEDTIME   dexamethasone 2 MG tablet Commonly known as: DECADRON Take 2 tablets once daily for 3 days, then 1 tablet once daily for 3 days, then STOP.   famotidine 20 MG tablet Commonly known as: Pepcid Take 1 tablet (20 mg total) by mouth daily for 10 days.          Objective:   Physical Exam BP 131/90 (BP Location: Left Arm, Patient Position: Sitting, Cuff Size: Normal)   Pulse 69   Temp (!) 95.6 F (35.3 C) (Temporal)   Resp 18   Ht 6\' 3"  (1.905 m)   Wt 195 lb 4 oz (88.6 kg)   SpO2 100%   BMI 24.40 kg/m  General:   Well developed, NAD, BMI noted.  HEENT:  Normocephalic . Face symmetric, atraumatic Lungs:  CTA B Normal respiratory effort,  no intercostal retractions, no accessory muscle use. Heart: RRR,  no murmur.  Abdomen:  Not distended, soft, non-tender. No rebound or rigidity.   Skin: Not pale. Not jaundice Lower extremities: no pretibial edema bilaterally  Neurologic:  alert & oriented X3.  Speech normal, gait appropriate for age and unassisted Psych--  Cognition and judgment appear intact.  Cooperative with normal attention span and concentration.  Behavior appropriate. No anxious or depressed appearing.     Assessment     Assessment  Dyslipidemia L wrist Fx 2015  Fever blisters- valtrex rarely  PLAN: Covid pneumonia with acute hypoxic respiratory failure: Since the last visit he is feeling much better, O2 sat at home normal. We will get a chest x-ray, CBC. He had Tocilizumab and convalescent plasma consequently recommend to take the vaccination 90 days after the illness. High cholesterol: s/p switch from simvastatin to atorvastatin to get better control of cholesterol back in 10-2019, checking FLP noting that last LFTs were slightly elevated in the context of Covid pneumonia. RTC CPX 11/2020   This visit occurred during the SARS-CoV-2 public health emergency.  Safety protocols were in place, including screening questions prior to the visit, additional usage of staff PPE,  and extensive cleaning of exam room while observing appropriate contact time as indicated for disinfecting solutions.

## 2020-03-10 NOTE — Patient Instructions (Addendum)
GO TO THE LAB : Get the blood work     GO TO THE FRONT DESK Come back for a physical exam by 11/2020, please make an appointment   STOP BY THE FIRST FLOOR:  get the XR

## 2020-03-10 NOTE — Progress Notes (Signed)
Pre visit review using our clinic review tool, if applicable. No additional management support is needed unless otherwise documented below in the visit note. 

## 2020-03-11 NOTE — Assessment & Plan Note (Signed)
Covid pneumonia with acute hypoxic respiratory failure: Since the last visit he is feeling much better, O2 sat at home normal. We will get a chest x-ray, CBC. He had Tocilizumab and convalescent plasma consequently recommend to take the vaccination 90 days after the illness. High cholesterol: s/p switch from simvastatin to atorvastatin to get better control of cholesterol back in 10-2019, checking FLP noting that last LFTs were slightly elevated in the context of Covid pneumonia. RTC CPX 11/2020

## 2020-03-17 ENCOUNTER — Other Ambulatory Visit: Payer: Self-pay

## 2020-03-17 ENCOUNTER — Ambulatory Visit (AMBULATORY_SURGERY_CENTER): Payer: Self-pay | Admitting: *Deleted

## 2020-03-17 VITALS — Temp 97.3°F | Ht 75.0 in | Wt 200.0 lb

## 2020-03-17 DIAGNOSIS — Z01818 Encounter for other preprocedural examination: Secondary | ICD-10-CM

## 2020-03-17 DIAGNOSIS — Z86010 Personal history of colonic polyps: Secondary | ICD-10-CM

## 2020-03-17 MED ORDER — SUPREP BOWEL PREP KIT 17.5-3.13-1.6 GM/177ML PO SOLN: 1.0000 | Freq: Once | ORAL | 0 refills | Status: AC

## 2020-03-17 NOTE — Progress Notes (Signed)

## 2020-03-19 ENCOUNTER — Other Ambulatory Visit: Payer: Self-pay

## 2020-03-31 ENCOUNTER — Encounter: Payer: Self-pay | Admitting: Internal Medicine

## 2020-03-31 ENCOUNTER — Other Ambulatory Visit: Payer: Self-pay

## 2020-03-31 ENCOUNTER — Ambulatory Visit (AMBULATORY_SURGERY_CENTER): Payer: 59 | Admitting: Internal Medicine

## 2020-03-31 VITALS — BP 117/74 | HR 56 | Temp 97.1°F | Resp 13 | Ht 75.0 in | Wt 200.0 lb

## 2020-03-31 DIAGNOSIS — Z86010 Personal history of colonic polyps: Secondary | ICD-10-CM

## 2020-03-31 DIAGNOSIS — D124 Benign neoplasm of descending colon: Secondary | ICD-10-CM

## 2020-03-31 DIAGNOSIS — K635 Polyp of colon: Secondary | ICD-10-CM

## 2020-03-31 MED ORDER — SODIUM CHLORIDE 0.9 % IV SOLN: 500.0000 mL | INTRAVENOUS | Status: DC

## 2020-03-31 NOTE — Progress Notes (Signed)
JB - Temp DT- VS  Pt's states no medical or surgical changes since previsit or office visit.

## 2020-03-31 NOTE — Op Note (Signed)
[facility] Patient Name: Christopher Santos Procedure Date: 03/31/2020 8:38 AM MRN: [id number] Endoscopist: Docia Chuck. Henrene Pastor , MD Age: 69 Referring MD:  Date of Birth: 12-24-1951 Gender: Male Account #: [id number] Procedure:                Colonoscopy with cold snare polypectomy x 1 Indications:              High risk colon cancer surveillance: Personal                            history of multiple (3 or more) adenomas, High risk                            colon cancer surveillance: Personal history of                            sessile serrated colon polyp (less than 10 mm in                            size) with no dysplasia. Previous examinations                            2004, 2009, 2014 Medicines:                Monitored Anesthesia Care Procedure:                Pre-Anesthesia Assessment:                           - Prior to the procedure, a History and Physical                            was performed, and patient medications and                            allergies were reviewed. The patient's tolerance of                            previous anesthesia was also reviewed. The risks                            and benefits of the procedure and the sedation                            options and risks were discussed with the patient.                            All questions were answered, and informed consent                            was obtained. Prior Anticoagulants: The patient has                            taken no previous anticoagulant or antiplatelet  agents. ASA Grade Assessment: II - A patient with                            mild systemic disease. After reviewing the risks                            and benefits, the patient was deemed in                            satisfactory condition to undergo the procedure.                           After obtaining informed consent, the colonoscope                            was passed under direct  vision. Throughout the                            procedure, the patient's blood pressure, pulse, and                            oxygen saturations were monitored continuously. The                            Colonoscope was introduced through the anus and                            advanced to the the cecum, identified by                            appendiceal orifice and ileocecal valve. The                            ileocecal valve, appendiceal orifice, and rectum                            were photographed. The quality of the bowel                            preparation was excellent. The colonoscopy was                            performed without difficulty. The patient tolerated                            the procedure well. The bowel preparation used was                            SUPREP via split dose instruction. Scope In: 8:45:21 AM Scope Out: 8:57:25 AM Scope Withdrawal Time: 0 hours 8 minutes 19 seconds  Total Procedure Duration: 0 hours 12 minutes 4 seconds  Findings:                 A 4 mm polyp was found in the descending colon.  The                            polyp was sessile. The polyp was removed with a                            cold snare. Resection and retrieval were complete.                           Multiple small and large-mouthed diverticula were                            found in the left colon.                           Internal hemorrhoids were found during retroflexion.                           The exam was otherwise without abnormality on                            direct and retroflexion views. Complications:            No immediate complications. Estimated blood loss:                            None. Estimated Blood Loss:     Estimated blood loss: none. Impression:               - One 4 mm polyp in the descending colon, removed                            with a cold snare. Resected and retrieved.                           - Diverticulosis in the left  colon.                           - Internal hemorrhoids.                           - The examination was otherwise normal on direct                            and retroflexion views. Recommendation:           - Repeat colonoscopy in 5 years for surveillance.                           - Patient has a contact number available for                            emergencies. The signs and symptoms of potential                            delayed complications were discussed with the  patient. Return to normal activities tomorrow.                            Written discharge instructions were provided to the                            patient.                           - Resume previous diet.                           - Continue present medications.                           - Await pathology results. Docia Chuck. Henrene Pastor, MD 03/31/2020 9:03:58 AM This report has been signed electronically.

## 2020-03-31 NOTE — Progress Notes (Signed)
Report to PACU, RN, vss, BBS= Clear.  

## 2020-03-31 NOTE — Patient Instructions (Signed)
Handouts provided on polyps, diverticulosis and hemorrhoids.   YOU HAD AN ENDOSCOPIC PROCEDURE TODAY AT THE [facility]:   Refer to the procedure report that was given to you for any specific questions about what was found during the examination.  If the procedure report does not answer your questions, please call your gastroenterologist to clarify.  If you requested that your care partner not be given the details of your procedure findings, then the procedure report has been included in a sealed envelope for you to review at your convenience later.  YOU SHOULD EXPECT: Some feelings of bloating in the abdomen. Passage of more gas than usual.  Walking can help get rid of the air that was put into your GI tract during the procedure and reduce the bloating. If you had a lower endoscopy (such as a colonoscopy or flexible sigmoidoscopy) you may notice spotting of blood in your stool or on the toilet paper. If you underwent a bowel prep for your procedure, you may not have a normal bowel movement for a few days.  Please Note:  You might notice some irritation and congestion in your nose or some drainage.  This is from the oxygen used during your procedure.  There is no need for concern and it should clear up in a day or so.  SYMPTOMS TO REPORT IMMEDIATELY:   Following lower endoscopy (colonoscopy or flexible sigmoidoscopy):  Excessive amounts of blood in the stool  Significant tenderness or worsening of abdominal pains  Swelling of the abdomen that is new, acute  Fever of 100F or higher   For urgent or emergent issues, a gastroenterologist can be reached at any hour by calling [phone]. Do not use MyChart messaging for urgent concerns.    DIET:  We do recommend a small meal at first, but then you may proceed to your regular diet.  Drink plenty of fluids but you should avoid alcoholic beverages for 24 hours.  ACTIVITY:  You should plan to take it easy for the rest of today and  you should NOT DRIVE or use heavy machinery until tomorrow (because of the sedation medicines used during the test).    FOLLOW UP: Our staff will call the number listed on your records 48-72 hours following your procedure to check on you and address any questions or concerns that you may have regarding the information given to you following your procedure. If we do not reach you, we will leave a message.  We will attempt to reach you two times.  During this call, we will ask if you have developed any symptoms of COVID 19. If you develop any symptoms (ie: fever, flu-like symptoms, shortness of breath, cough etc.) before then, please call [phone].  If you test positive for Covid 19 in the 2 weeks post procedure, please call and report this information to us.    If any biopsies were taken you will be contacted by phone or by letter within the next 1-3 weeks.  Please call us at [phone] if you have not heard about the biopsies in 3 weeks.    SIGNATURES/CONFIDENTIALITY: You and/or your care partner have signed paperwork which will be entered into your electronic medical record.  These signatures attest to the fact that that the information above on your After Visit Summary has been reviewed and is understood.  Full responsibility of the confidentiality of this discharge information lies with you and/or your care-partner.  

## 2020-04-02 ENCOUNTER — Telehealth: Payer: Self-pay | Admitting: *Deleted

## 2020-04-02 NOTE — Telephone Encounter (Signed)
1. Have you developed a fever since your procedure? no  2.   Have you had an respiratory symptoms (SOB or cough) since your procedure? no  3.   Have you tested positive for COVID 19 since your procedure no  4.   Have you had any family members/close contacts diagnosed with the COVID 19 since your procedure?  no   If yes to any of these questions please route to Joylene John, RN and Erenest Rasher, RN Follow up Call-  Call back number 03/31/2020  Post procedure Call Back phone  # [phone]  Permission to leave phone message Yes  Some recent data might be hidden     Patient questions:  Do you have a fever, pain , or abdominal swelling? No. Pain Score  0 *  Have you tolerated food without any problems? yes  Have you been able to return to your normal activities? Yes.    Do you have any questions about your discharge instructions: Diet   No. Medications  No. Follow up visit  No.  Do you have questions or concerns about your Care? No.  Actions: * If pain score is 4 or above: No action needed, pain <4.

## 2020-04-05 ENCOUNTER — Encounter: Payer: Self-pay | Admitting: Internal Medicine

## 2020-04-29 ENCOUNTER — Ambulatory Visit: Payer: 59 | Attending: Internal Medicine

## 2020-04-29 DIAGNOSIS — Z23 Encounter for immunization: Secondary | ICD-10-CM

## 2020-04-29 NOTE — Progress Notes (Signed)
   Covid-19 Vaccination Clinic  Name:  Christopher Santos    MRN: [id number] DOB: 05/05/1951  04/29/2020  [name] was observed post Covid-19 immunization for 15 minutes without incident. He was provided with Vaccine Information Sheet and instruction to access the V-Safe system.   [name] was instructed to call 911 with any severe reactions post vaccine: Marland Kitchen Difficulty breathing  . Swelling of face and throat  . A fast heartbeat  . A bad rash all over body  . Dizziness and weakness   Immunizations Administered    Name Date Dose VIS Date Route   Pfizer COVID-19 Vaccine 04/29/2020 12:54 PM 0.3 mL 02/18/2019 Intramuscular   Manufacturer: [place]   Lot: [id number]   [place]: [id number]

## 2020-05-25 ENCOUNTER — Ambulatory Visit: Payer: 59 | Attending: Internal Medicine

## 2020-05-25 DIAGNOSIS — Z23 Encounter for immunization: Secondary | ICD-10-CM

## 2020-05-25 NOTE — Progress Notes (Signed)
° °  Covid-19 Vaccination Clinic  Name:  Christopher Santos    MRN: [id number] DOB: 1951/07/20  05/25/2020  [name] was observed post Covid-19 immunization for 15 minutes without incident. He was provided with Vaccine Information Sheet and instruction to access the V-Safe system.   [name] was instructed to call 911 with any severe reactions post vaccine:  Difficulty breathing   Swelling of face and throat   A fast heartbeat   A bad rash all over body   Dizziness and weakness   Immunizations Administered    Name Date Dose VIS Date Route   Pfizer COVID-19 Vaccine 05/25/2020 12:43 PM 0.3 mL 02/18/2019 Intramuscular   Manufacturer: [place]   Lot: [id number]   [place]: [id number]

## 2020-08-06 ENCOUNTER — Encounter: Payer: Self-pay | Admitting: Internal Medicine

## 2020-08-09 ENCOUNTER — Other Ambulatory Visit: Payer: Self-pay

## 2020-08-09 ENCOUNTER — Encounter: Payer: Self-pay | Admitting: Family Medicine

## 2020-08-09 ENCOUNTER — Ambulatory Visit: Payer: 59 | Admitting: Family Medicine

## 2020-08-09 VITALS — BP 140/78 | Ht 75.0 in | Wt 200.0 lb

## 2020-08-09 DIAGNOSIS — M7540 Impingement syndrome of unspecified shoulder: Secondary | ICD-10-CM | POA: Diagnosis not present

## 2020-08-09 MED ORDER — METHYLPREDNISOLONE ACETATE 40 MG/ML IJ SUSP
40.0000 mg | Freq: Once | INTRAMUSCULAR | Status: AC
  Administered 2020-08-09: 40 mg via INTRA_ARTICULAR

## 2020-08-09 NOTE — Patient Instructions (Signed)
You have rotator cuff impingement of both shoulders. Try to avoid painful activities (overhead activities, lifting with extended arm) as much as possible. Aleve 2 tabs twice a day with food OR ibuprofen 3 tabs three times a day with food for pain and inflammation as needed. Can take tylenol in addition to this. Subacromial injection may be beneficial to help with pain and to decrease inflammation - you were given this today in both shoulders. Start physical therapy with transition to home exercise program. Do home exercise program with theraband and scapular stabilization exercises daily 3 sets of 10 once a day. If not improving at follow-up we will consider imaging and/or nitro patches. Follow up with me in 5-6 weeks.

## 2020-08-09 NOTE — Progress Notes (Addendum)
PCP: Colon Branch, MD  Subjective:   HPI: Patient is a 68 y.o. male here for bilateral shoulder pain.  Christopher Santos states that his right shoulder has been hurting him for several years and he has been seen at this clinic before for it. States the pain mostly resolved after the 2018 visit but has reoccurred in the past couple months.  In the last 1 month, patient has new left shoulder pain that is identical to his right shoulder pain.  He denies any recent injuries, trauma, falls that may have led to this.  He notes that the pain is worse in the anterior aspect of the shoulder with some radiation down his arm stopping halfway before reaching the elbow.  He has difficulty raising his left arm all the way due to pain.  At night, occasionally the pain will wake him up if he makes a sudden movement and is has caused significant deterioration in his sleep quality.  Has been using Advil with some mild relief of pain.  Denies any repetitive motions as he remembers that led to his previous rotator cuff impingement in 2018.  Christopher Santos also endorses bilateral knee hip pain that began shortly after switching his statin to Lipitor. He has discussed this with his GP who recommended cessation of Lipitor for 3 weeks to see if pain resolves.  Christopher Santos also has noted decreased leg hair in his bilateral calfs but is unsure the significance of this.  He notes some mild swelling in his feet that resolves after elevating.  He notes his feet are not usually cold but have been in the last few weeks.  Past Medical History:  Diagnosis Date  . Allergy   . Blood transfusion without reported diagnosis    01-26-2020  . COVID-19 virus infection 01/26/2020  . Hyperlipemia   . Hypertension    on occasion but not treated     Current Outpatient Medications on File Prior to Visit  Medication Sig Dispense Refill  . Ascorbic Acid (VITAMIN C) 1000 MG tablet Take 1,000 mg by mouth daily.    Marland Kitchen aspirin 81 MG tablet Take 81 mg by mouth at bedtime.      Marland Kitchen atorvastatin (LIPITOR) 40 MG tablet TAKE 1 TABLET BY MOUTH EVERYDAY AT BEDTIME 90 tablet 1   No current facility-administered medications on file prior to visit.    Past Surgical History:  Procedure Laterality Date  . COLONOSCOPY    . POLYPECTOMY    . TONSILLECTOMY     at age 13    No Known Allergies  Social History   Socioeconomic History  . Marital status: Married    Spouse name: Not on file  . Number of children: 0  . Years of education: Not on file  . Highest education level: Not on file  Occupational History  . Occupation: IT , [employer] (used to be [employer])  Tobacco Use  . Smoking status: Former Smoker    Quit date: 1993    Years since quitting: 28.6  . Smokeless tobacco: Never Used  . Tobacco comment: quit 1993, no heavy use   Vaping Use  . Vaping Use: Never used  Substance and Sexual Activity  . Alcohol use: Yes    Alcohol/week: 3.0 standard drinks    Types: 3 Cans of beer per week    Comment: wine  . Drug use: No  . Sexual activity: Not on file  Other Topics Concern  . Not on file  Social History Narrative   Married,  no children, original from [place], works for [employer] (doing the same he did at [employer])    Social Determinants of Radio broadcast assistant Strain:   . Difficulty of Paying Living Expenses:   Food Insecurity:   . Worried About Charity fundraiser in the Last Year:   . Arboriculturist in the Last Year:   Transportation Needs:   . Film/video editor (Medical):   Marland Kitchen Lack of Transportation (Non-Medical):   Physical Activity:   . Days of Exercise per Week:   . Minutes of Exercise per Session:   Stress:   . Feeling of Stress :   Social Connections:   . Frequency of Communication with Friends and Family:   . Frequency of Social Gatherings with Friends and Family:   . Attends Religious Services:   . Active Member of Clubs or Organizations:   . Attends Archivist Meetings:   Marland Kitchen Marital Status:   Intimate Partner Violence:   . Fear of  Current or Ex-Partner:   . Emotionally Abused:   Marland Kitchen Physically Abused:   . Sexually Abused:     Family History  Problem Relation Age of Onset  . CAD Father 31       MI at 66  . Diabetes Neg Hx   . Colon cancer Neg Hx   . Prostate cancer Neg Hx   . Colon polyps Neg Hx   . Esophageal cancer Neg Hx   . Rectal cancer Neg Hx   . Stomach cancer Neg Hx     BP 140/78   Ht 6\' 3"  (1.905 m)   Wt 200 lb (90.7 kg)   BMI 25.00 kg/m   Review of Systems: See HPI above.     Objective:  Physical Exam:  Gen: NAD, comfortable in exam room  Right Shoulder: No gross deformities on inspection.  Full range of motion intact, although limited by pain above a 90 degree angle, especially with flexion and abduction.   No tenderness AC joint. Internal and external rotation are limited by pain, particularly external rotation. External rotation loss of approximately 15 degrees. Pain with both active and passive motion. No weakness noted on empty can test.  Mild pain elicited with Hawkins and Neer's.  Left shoulder: No gross deformities on inspection.  No tenderness AC joint.  Full range of motion intact, but limited by pain above 90 degree angle, especially with flexion and abduction.  Internal and external rotation intact, however external rotation loss of approximately 15 degrees.  No weakness on empty can test.  Significant pain elicited with Hawkins.  Mild pain elicited with Neer's.  Bilateral lower extremities: Bilateral feet are mildly dusky in color on inspection.  Mild nonpitting edema noted of bilateral feet up to the ankle.  DP pulses are palpable but diminished.   Assessment & Plan:  1.  Bilateral shoulder pain: Secondary to rotator cuff impingement.  Discussed different treatment options including home exercises versus physical therapy and pain management with NSAIDs versus steroid injection.  Patient would like to move forward with both home exercises and physical therapy.  Referral has been  placed.  For pain management he is interested in steroid injections at this time.  Bilateral subacromial steroid injection via ultrasound guidance performed in clinic today; no complications and patient tolerated well.  Recommend follow-up in 5 to 6 weeks.  After informed written consent timeout was performed, patient was seated in chair in exam room. Right shoulder was prepped with  alcohol swab and utilizing lateral approach with ultrasound guidance, patient's right subacromial space was injected with 3:1 bupivicaine: depomedrol. Patient tolerated the procedure well without immediate complications.  After informed written consent timeout was performed, patient was seated in chair in exam room. Left shoulder was prepped with alcohol swab and utilizing lateral approach with ultrasound guidance, patient's left subacromial space was injected with 3:1 bupivicaine: depomedrol. Patient tolerated the procedure well without immediate complications.  2.  Lower extremity hair loss: Given dusky appearance of feet and diminished pulses on examination, I urged the patient to follow-up with his GP as he may have peripheral vascular disease.  Patient expressed understanding will do so.

## 2020-08-11 ENCOUNTER — Ambulatory Visit: Payer: 59 | Attending: Family Medicine

## 2020-08-11 ENCOUNTER — Other Ambulatory Visit: Payer: Self-pay

## 2020-08-11 DIAGNOSIS — G8929 Other chronic pain: Secondary | ICD-10-CM | POA: Diagnosis present

## 2020-08-11 DIAGNOSIS — R293 Abnormal posture: Secondary | ICD-10-CM

## 2020-08-11 DIAGNOSIS — M25512 Pain in left shoulder: Secondary | ICD-10-CM | POA: Diagnosis not present

## 2020-08-11 DIAGNOSIS — M25511 Pain in right shoulder: Secondary | ICD-10-CM | POA: Insufficient documentation

## 2020-08-11 NOTE — Patient Instructions (Signed)
Access Code: [email or web address] URL: [email or web address] Date: 08/11/2020 Prepared by: Claiborne Billings  Exercises Seated Shoulder Horizontal Abduction with Resistance - Thumbs Up - 2 x daily - 7 x weekly - 2 sets - 10 reps Standing Shoulder External Rotation with Resistance - 2 x daily - 7 x weekly - 2 sets - 10 reps Seated Shoulder Diagonal with Resistance - 2 x daily - 7 x weekly - 2 sets - 10 reps Seated Correct Posture - 1 x daily - 7 x weekly - 3 sets - 10 reps

## 2020-08-11 NOTE — Therapy (Signed)
[facility] [address] [place], [place], [address] Phone: [phone]   Fax:  [phone]  Physical Therapy Evaluation  Patient Details  Name: Christopher Santos MRN: [id number] Date of Birth: 12-27-1950 Referring Provider (PT): Karlton Lemon, MD   Encounter Date: 08/11/2020   PT End of Session - 08/11/20 1710    Visit Number 1    Date for PT Re-Evaluation 10/06/20    Authorization Type UHC    PT Start Time 6599    PT Stop Time 1654    PT Time Calculation (min) 39 min    Activity Tolerance Patient tolerated treatment well    Behavior During Therapy [facility] for tasks assessed/performed           Past Medical History:  Diagnosis Date  . Allergy   . Blood transfusion without reported diagnosis    01-26-2020  . COVID-19 virus infection 01/26/2020  . Hyperlipemia   . Hypertension    on occasion but not treated     Past Surgical History:  Procedure Laterality Date  . COLONOSCOPY    . POLYPECTOMY    . TONSILLECTOMY     at age 53    There were no vitals filed for this visit.    Subjective Assessment - 08/11/20 1621    Subjective Pt presents with a chronic history of Rt shoulder pain and 1 month history of Lt shoulder pain.  Pt had subacromial injections into bil shoulders 2 days ago and this has helped.    Currently in Pain? Yes    Pain Score 2    prior to injections 8-9/10, 2/10 max since injections   Pain Location Shoulder    Pain Orientation Left;Right    Pain Descriptors / Indicators Sore    Pain Type Chronic pain    Pain Onset More than a month ago    Pain Frequency Constant    Aggravating Factors  worse at night with sleep    Pain Relieving Factors during the day              [facility] PT Assessment - 08/11/20 0001      Assessment   Medical Diagnosis rotator cuff impingement syndrome, unspecified laterality    Referring Provider (PT) Karlton Lemon, MD    Onset Date/Surgical Date 08/11/17   Lt  shoulder 1 month history   Hand Dominance Right    Next MD Visit 6 weeks     Prior Therapy none       Precautions   Precautions None    Precaution Comments Covid and pneumonia      Balance Screen   Has the patient fallen in the past 6 months No    Has the patient had a decrease in activity level because of a fear of falling?  No    Is the patient reluctant to leave their home because of a fear of falling?  No      Home Ecologist residence      Prior Function   Level of Independence Independent    Vocation Full time employment    Vocation Requirements desk work    Leisure Higher education careers adviser   Overall Cognitive Status Within Functional Limits for tasks assessed      Observation/Other Assessments   Focus on Therapeutic Outcomes (FOTO)  30% limitation      Posture/Postural Control   Posture/Postural Control Postural limitations    Postural Limitations  Rounded Shoulders;Forward head    Posture Comments Pt able to correct to neutral posture after verbal and demo cues      ROM / Strength   AROM / PROM / Strength AROM;PROM;Strength      AROM   Overall AROM  Within functional limits for tasks performed    Overall AROM Comments painful arc with bil shoulder abduction between 120-150 degrees.  Flexion is painful at end range.      PROM   Overall PROM  Within functional limits for tasks performed      Strength   Overall Strength Within functional limits for tasks performed    Overall Strength Comments full shoulder strength      Palpation   Palpation comment palpable tenderness over Rt and Lt AC joint and lateral deltoid.        Special Tests    Special Tests Rotator Cuff Impingement    Other special tests negative due to injections 2 days ago    Rotator Cuff Impingment tests Michel Bickers test;Neer impingement test      Transfers   Transfers Independent with all Transfers      Ambulation/Gait   Ambulation/Gait Yes    Gait  Pattern Within Functional Limits                      Objective measurements completed on examination: See above findings.               PT Education - 08/11/20 1651    Education Details Access Code: [email or web address]    Person(s) Educated Patient    Methods Explanation;Demonstration;Handout    Comprehension Verbalized understanding;Returned demonstration            PT Short Term Goals - 08/11/20 1642      PT SHORT TERM GOAL #1   Title be independent in advanced HEP for postural strength and flexibility    Time 4    Period Weeks    Status New    Target Date 09/08/20      PT SHORT TERM GOAL #2   Title report a 30% reduction in bil shoulder pain with sleep at night    Time 4    Period Weeks    Status New    Target Date 09/08/20             PT Long Term Goals - 08/11/20 1652      PT LONG TERM GOAL #1   Title patient to be independent with advanced HEP    Time 8    Period Weeks    Status New    Target Date 10/06/20      PT LONG TERM GOAL #2   Title reduce FOTO to < or = to 27% limitation    Time 8    Period Weeks    Status New    Target Date 10/06/20      PT LONG TERM GOAL #3   Title patient to demonstrate and report postural corrections to reduce scapular protraction    Time 8    Period Weeks    Status New    Target Date 10/06/20      PT LONG TERM GOAL #4   Title report a 75% reduction in bil shoulder pain at night with sleep    Time 8    Period Weeks    Status New    Target Date 10/06/20  Plan - 08/11/20 1705    Clinical Impression Statement Pt presents to PT with a chronic history of Rt shoulder pain and 1 month history of Lt shoulder pain and was recently diagnosed with subacromial bursitis and impingent syndrome.  Pt received injections 2 days ago that significantly reduced the pain.  Prior to injection, pt was waking every hour with 8-9/10 bil shoulder pain.  Pt has been able to sleep without pain since  injections.  Pt has forward head and rounded shoulder posture that he is able to correct with verbal and demo cueing.  Pt with painful arc with bil shoulder abduction and end range shoulder flexion with full ROM throughout.  Pt with palpable tenderness over bil AC joints and deltoids.  Pt will benefit from skilled PT to address postural strength and alignment to achieve painfree ROM and sleep without limitation.    Examination-Activity Limitations Sleep    Stability/Clinical Decision Making Stable/Uncomplicated    Clinical Decision Making Low    Rehab Potential Excellent    PT Frequency 1x / week    PT Duration 8 weeks    PT Treatment/Interventions ADLs/Self Care Home Management;Cryotherapy;Electrical Stimulation;Moist Heat;Therapeutic activities;Therapeutic exercise;Neuromuscular re-education;Manual techniques;Passive range of motion;Dry needling;Taping    PT Next Visit Plan review HEP and add to HEP for postural strength and shoulder AROM with overpressure    PT Home Exercise Plan Access Code: [email or web address]    Consulted and Agree with Plan of Care Patient           Patient will benefit from skilled therapeutic intervention in order to improve the following deficits and impairments:  Decreased activity tolerance, Postural dysfunction, Improper body mechanics, Pain, Increased muscle spasms  Visit Diagnosis: Chronic left shoulder pain - Plan: PT plan of care cert/re-cert  Chronic right shoulder pain - Plan: PT plan of care cert/re-cert  Abnormal posture - Plan: PT plan of care cert/re-cert     Problem List Patient Active Problem List   Diagnosis Date Noted  . Pneumonia due to COVID-19 virus 01/26/2020  . Pneumonia due to 2019 novel coronavirus 01/26/2020  . Low back pain radiating to left leg 02/25/2018  . Right shoulder pain 11/19/2016  . PCP NOTES >>>> 11/04/2015  . Annual physical exam 07/05/2012  . SKIN LESIONS, MULTIPLE 10/05/2010  . COLONIC POLYPS 08/15/2007  . Hyperlipidemia  08/15/2007     Sigurd Sos, PT 08/11/20 5:10 PM  [facility] [address] [place], [place], [address] Phone: [phone]   Fax:  [phone]  Name: KROY SPRUNG MRN: [id number] Date of Birth: 1951/08/21

## 2020-08-16 ENCOUNTER — Other Ambulatory Visit: Payer: Self-pay

## 2020-08-16 ENCOUNTER — Ambulatory Visit: Payer: 59

## 2020-08-16 DIAGNOSIS — M25512 Pain in left shoulder: Secondary | ICD-10-CM

## 2020-08-16 DIAGNOSIS — M25511 Pain in right shoulder: Secondary | ICD-10-CM

## 2020-08-16 DIAGNOSIS — R293 Abnormal posture: Secondary | ICD-10-CM

## 2020-08-16 NOTE — Patient Instructions (Signed)
Access Code: [email or web address] URL: [email or web address] Date: 08/16/2020 Prepared by: Claiborne Billings  Exercises  Doorway Pec Stretch at 90 Degrees Abduction - 2 x daily - 7 x weekly - 1 sets - 5 reps - 10 hold Seated Shoulder Abduction - Palms Down - 2 x daily - 7 x weekly - 10 reps - 2 sets Seated Shoulder Flexion - 2 x daily - 7 x weekly - 10 reps - 2 sets Seated Shoulder Scaption - 2 x daily - 7 x weekly - 10 reps - 2 sets

## 2020-08-16 NOTE — Therapy (Signed)
[facility] [address] [place], [place], [address] Phone: [phone]   Fax:  [phone]  Physical Therapy Treatment  Patient Details  Name: Christopher Santos MRN: [id number] Date of Birth: 13-Jun-1951 Referring Provider (PT): Karlton Lemon, MD   Encounter Date: 08/16/2020   PT End of Session - 08/16/20 1302    Visit Number 2    Date for PT Re-Evaluation 10/06/20    Authorization Type UHC    PT Start Time 1233    PT Stop Time 1300    PT Time Calculation (min) 27 min    Activity Tolerance Patient tolerated treatment well    Behavior During Therapy [facility] for tasks assessed/performed           Past Medical History:  Diagnosis Date  . Allergy   . Blood transfusion without reported diagnosis    01-26-2020  . COVID-19 virus infection 01/26/2020  . Hyperlipemia   . Hypertension    on occasion but not treated     Past Surgical History:  Procedure Laterality Date  . COLONOSCOPY    . POLYPECTOMY    . TONSILLECTOMY     at age 76    There were no vitals filed for this visit.   Subjective Assessment - 08/16/20 1234    Subjective I didn't do as much exercise on the weekend because I was traveling.    Currently in Pain? No/denies                             [facility] Adult PT Treatment/Exercise - 08/16/20 0001      Exercises   Exercises Shoulder      Shoulder Exercises: Seated   Horizontal ABduction Strengthening;Both;20 reps    Theraband Level (Shoulder Horizontal ABduction) Level 1 (Yellow)    External Rotation Strengthening;Both;20 reps    Theraband Level (Shoulder External Rotation) Level 1 (Yellow)    Diagonals Strengthening;Both;20 reps;Theraband    Theraband Level (Shoulder Diagonals) Level 1 (Yellow)    Other Seated Exercises 3 way raises: 2# with scapular retraction 2x10      Shoulder Exercises: ROM/Strengthening   UBE (Upper Arm Bike) Level 2x 5 minutes-reverse   PT present to  discuss activity modification     Shoulder Exercises: Stretch   Other Shoulder Stretches doorway pec stretch 5x10 seconds                  PT Education - 08/16/20 1256    Education Details Access Code: [email or web address]    Person(s) Educated Patient    Methods Explanation;Demonstration;Handout    Comprehension Verbalized understanding;Returned demonstration            PT Short Term Goals - 08/11/20 1642      PT SHORT TERM GOAL #1   Title be independent in advanced HEP for postural strength and flexibility    Time 4    Period Weeks    Status New    Target Date 09/08/20      PT SHORT TERM GOAL #2   Title report a 30% reduction in bil shoulder pain with sleep at night    Time 4    Period Weeks    Status New    Target Date 09/08/20             PT Long Term Goals - 08/11/20 1652      PT LONG TERM GOAL #1   Title patient to be  independent with advanced HEP    Time 8    Period Weeks    Status New    Target Date 10/06/20      PT LONG TERM GOAL #2   Title reduce FOTO to < or = to 27% limitation    Time 8    Period Weeks    Status New    Target Date 10/06/20      PT LONG TERM GOAL #3   Title patient to demonstrate and report postural corrections to reduce scapular protraction    Time 8    Period Weeks    Status New    Target Date 10/06/20      PT LONG TERM GOAL #4   Title report a 75% reduction in bil shoulder pain at night with sleep    Time 8    Period Weeks    Status New    Target Date 10/06/20                 Plan - 08/16/20 1246    Clinical Impression Statement Pt with first time follow-up after evaluation.  Pt is working to make postural corrections at home.  PT educated pt to avoid repetitive forward motion and to balance any repetitive motion with activation of the posterior/postural musculature.  Pt demonstrated all initial HEP correctly and demonstrated improved postural awareness throughout.  Pt will continue to benefit from skilled PT to  address bil shoulder impingement.    PT Frequency 1x / week    PT Duration 8 weeks    PT Treatment/Interventions ADLs/Self Care Home Management;Cryotherapy;Electrical Stimulation;Moist Heat;Therapeutic activities;Therapeutic exercise;Neuromuscular re-education;Manual techniques;Passive range of motion;Dry needling;Taping    PT Next Visit Plan review new HEP, postural strength, global shoulder strength    PT Home Exercise Plan Access Code: [email or web address]    Consulted and Agree with Plan of Care Patient           Patient will benefit from skilled therapeutic intervention in order to improve the following deficits and impairments:  Decreased activity tolerance, Postural dysfunction, Improper body mechanics, Pain, Increased muscle spasms  Visit Diagnosis: Chronic left shoulder pain  Chronic right shoulder pain  Abnormal posture     Problem List Patient Active Problem List   Diagnosis Date Noted  . Pneumonia due to COVID-19 virus 01/26/2020  . Pneumonia due to 2019 novel coronavirus 01/26/2020  . Low back pain radiating to left leg 02/25/2018  . Right shoulder pain 11/19/2016  . PCP NOTES >>>> 11/04/2015  . Annual physical exam 07/05/2012  . SKIN LESIONS, MULTIPLE 10/05/2010  . COLONIC POLYPS 08/15/2007  . Hyperlipidemia 08/15/2007     Sigurd Sos, PT 08/16/20 1:04 PM  [facility] [address] [place], [place], [address] Phone: [phone]   Fax:  [phone]  Name: Christopher Santos MRN: [id number] Date of Birth: October 06, 1951

## 2020-08-18 ENCOUNTER — Other Ambulatory Visit: Payer: Self-pay | Admitting: Internal Medicine

## 2020-08-23 ENCOUNTER — Other Ambulatory Visit: Payer: Self-pay

## 2020-08-23 ENCOUNTER — Encounter: Payer: Self-pay | Admitting: Physical Therapy

## 2020-08-23 ENCOUNTER — Ambulatory Visit: Payer: 59 | Admitting: Physical Therapy

## 2020-08-23 DIAGNOSIS — G8929 Other chronic pain: Secondary | ICD-10-CM

## 2020-08-23 DIAGNOSIS — M25512 Pain in left shoulder: Secondary | ICD-10-CM | POA: Diagnosis not present

## 2020-08-23 DIAGNOSIS — R293 Abnormal posture: Secondary | ICD-10-CM

## 2020-08-23 NOTE — Therapy (Signed)
[facility] [address] [place], [place], [address] Phone: [phone]   Fax:  [phone]  Physical Therapy Treatment  Patient Details  Name: Christopher Santos MRN: [id number] Date of Birth: 1951/12/16 Referring Provider (PT): Karlton Lemon, MD   Encounter Date: 08/23/2020   PT End of Session - 08/23/20 0927    Visit Number 3    Date for PT Re-Evaluation 10/06/20    PT Start Time 0928    PT Stop Time 1008    PT Time Calculation (min) 40 min    Activity Tolerance Patient tolerated treatment well    Behavior During Therapy [facility] for tasks assessed/performed           Past Medical History:  Diagnosis Date  . Allergy   . Blood transfusion without reported diagnosis    01-26-2020  . COVID-19 virus infection 01/26/2020  . Hyperlipemia   . Hypertension    on occasion but not treated     Past Surgical History:  Procedure Laterality Date  . COLONOSCOPY    . POLYPECTOMY    . TONSILLECTOMY     at age 69    There were no vitals filed for this visit.   Subjective Assessment - 08/23/20 0929    Subjective I am just a little sore today not bad.  Today is the worst it has felt since getting the injections    Currently in Pain? Yes    Pain Score 3     Pain Location Shoulder    Pain Orientation Right;Left    Pain Descriptors / Indicators Sore    Pain Type Chronic pain    Pain Onset More than a month ago    Aggravating Factors  a little hard getting going in the morning    Multiple Pain Sites No                             [facility] Adult PT Treatment/Exercise - 08/23/20 0001      Shoulder Exercises: Seated   Horizontal ABduction Strengthening;Both;20 reps    Theraband Level (Shoulder Horizontal ABduction) Level 2 (Red)    External Rotation Strengthening;Both;20 reps    Theraband Level (Shoulder External Rotation) Level 2 (Red)    Other Seated Exercises 3 way raises: 2# with scapular retraction  2x10      Shoulder Exercises: Prone   Retraction Strengthening;Right;Left;20 reps    Flexion Strengthening;Right;Left;15 reps    External Rotation Strengthening;Right;Left;10 reps    Other Prone Exercises quadruped - UE reaches cues for neutral spine and engage core - 10x each side      Shoulder Exercises: Standing   Extension Strengthening;Both;20 reps    Extension Weight (lbs) 15    Row Strengthening;Both;20 reps;Weights    Row Weight (lbs) 15      Shoulder Exercises: ROM/Strengthening   UBE (Upper Arm Bike) Level 2x 5 minutes-reverse   PT present to discuss activity modification     Shoulder Exercises: Stretch   Other Shoulder Stretches supine thoracic extension with towel UE overhead for pec stretch - 10sec x 5                    PT Short Term Goals - 08/11/20 1642      PT SHORT TERM GOAL #1   Title be independent in advanced HEP for postural strength and flexibility    Time 4    Period Weeks  Status New    Target Date 09/08/20      PT SHORT TERM GOAL #2   Title report a 30% reduction in bil shoulder pain with sleep at night    Time 4    Period Weeks    Status New    Target Date 09/08/20             PT Long Term Goals - 08/11/20 1652      PT LONG TERM GOAL #1   Title patient to be independent with advanced HEP    Time 8    Period Weeks    Status New    Target Date 10/06/20      PT LONG TERM GOAL #2   Title reduce FOTO to < or = to 27% limitation    Time 8    Period Weeks    Status New    Target Date 10/06/20      PT LONG TERM GOAL #3   Title patient to demonstrate and report postural corrections to reduce scapular protraction    Time 8    Period Weeks    Status New    Target Date 10/06/20      PT LONG TERM GOAL #4   Title report a 75% reduction in bil shoulder pain at night with sleep    Time 8    Period Weeks    Status New    Target Date 10/06/20                 Plan - 08/23/20 1011    Clinical Impression Statement Pt  able to advance in his exercises today.  He is having more pain possibly due to injections wearing off, but states pain is not bad.  Pt states he was the same if not a little better after doing the exercises today.  Pt was given updates to his HEP.  He has less difficulty when his spine is stabilized in prone positions.  Pt will benefit from skilled PT to continue to work on shoulder and core strength progressions.    PT Treatment/Interventions ADLs/Self Care Home Management;Cryotherapy;Electrical Stimulation;Moist Heat;Therapeutic activities;Therapeutic exercise;Neuromuscular re-education;Manual techniques;Passive range of motion;Dry needling;Taping    PT Next Visit Plan review new HEP, postural strength, global shoulder strength    PT Home Exercise Plan Access Code: [email or web address]    Consulted and Agree with Plan of Care Patient           Patient will benefit from skilled therapeutic intervention in order to improve the following deficits and impairments:  Decreased activity tolerance, Postural dysfunction, Improper body mechanics, Pain, Increased muscle spasms  Visit Diagnosis: Chronic left shoulder pain  Chronic right shoulder pain  Abnormal posture     Problem List Patient Active Problem List   Diagnosis Date Noted  . Pneumonia due to COVID-19 virus 01/26/2020  . Pneumonia due to 2019 novel coronavirus 01/26/2020  . Low back pain radiating to left leg 02/25/2018  . Right shoulder pain 11/19/2016  . PCP NOTES >>>> 11/04/2015  . Annual physical exam 07/05/2012  . SKIN LESIONS, MULTIPLE 10/05/2010  . COLONIC POLYPS 08/15/2007  . Hyperlipidemia 08/15/2007    Jule Ser, PT 08/23/2020, 10:17 AM  [facility] [address] [place], [place], [address] Phone: [phone]   Fax:  [phone]  Name: DREWEY BEGUE MRN: [id number] Date of Birth: [birth year]

## 2020-09-02 ENCOUNTER — Ambulatory Visit: Payer: 59 | Attending: Family Medicine | Admitting: Physical Therapy

## 2020-09-02 ENCOUNTER — Other Ambulatory Visit: Payer: Self-pay

## 2020-09-02 ENCOUNTER — Encounter: Payer: Self-pay | Admitting: Physical Therapy

## 2020-09-02 DIAGNOSIS — M25512 Pain in left shoulder: Secondary | ICD-10-CM | POA: Insufficient documentation

## 2020-09-02 DIAGNOSIS — R293 Abnormal posture: Secondary | ICD-10-CM | POA: Insufficient documentation

## 2020-09-02 DIAGNOSIS — G8929 Other chronic pain: Secondary | ICD-10-CM | POA: Diagnosis present

## 2020-09-02 DIAGNOSIS — M25511 Pain in right shoulder: Secondary | ICD-10-CM | POA: Diagnosis present

## 2020-09-02 NOTE — Therapy (Signed)
[facility] [address], [address] [place], [place], [address] Phone: [phone]   Fax:  [phone]  Physical Therapy Treatment  Patient Details  Name: Christopher Santos MRN: [id number] Date of Birth: 01/28/1951 Referring Provider (PT): Hudnall, Shane, MD   Encounter Date: 09/02/2020   PT End of Session - 09/02/20 1353    Visit Number 4    Date for PT Re-Evaluation 10/06/20    Authorization Type UHC    PT Start Time 1353    PT Stop Time 1435    PT Time Calculation (min) 42 min    Activity Tolerance Patient tolerated treatment well    Behavior During Therapy WFL for tasks assessed/performed           Past Medical History:  Diagnosis Date  . Allergy   . Blood transfusion without reported diagnosis    01-26-2020  . COVID-19 virus infection 01/26/2020  . Hyperlipemia   . Hypertension    on occasion but not treated     Past Surgical History:  Procedure Laterality Date  . COLONOSCOPY    . POLYPECTOMY    . TONSILLECTOMY     at age 7    There were no vitals filed for this visit.   Subjective Assessment - 09/02/20 1359    Subjective Pt states it feels a little better and no pain today.  I didn't have anywhere to do the prone exercises but other than that the other exercises are feeling good.    Currently in Pain? No/denies                             [facility] Adult PT Treatment/Exercise - 09/02/20 0001      Shoulder Exercises: Supine   Other Supine Exercises serratus punch - 15x each    Other Supine Exercises circles on the ceiling 3lb - 10x each way      Shoulder Exercises: Standing   Flexion Strengthening;Both;20 reps;Theraband    Theraband Level (Shoulder Flexion) Level 2 (Red)    Extension Strengthening;Both;20 reps    Theraband Level (Shoulder Extension) Level 2 (Red)    Extension Limitations cues to activate core    Row Both;20 reps;Theraband    Theraband Level (Shoulder Row) Level 2  (Red)    Other Standing Exercises wall push up - cues for posture and core      Shoulder Exercises: ROM/Strengthening   UBE (Upper Arm Bike) Level 2x 6 minutes-fwd/reverse      Shoulder Exercises: Stretch   Other Shoulder Stretches circles with 3lb - 10x each way      Shoulder Exercises: Power Tower   Other Power Tower Exercises bent over row - 4 lb 20x; shoulder ext bent over 4lb 10x; T bent over 4lb 10x                     PT Short Term Goals - 09/02/20 1404      PT SHORT TERM GOAL #1   Title be independent in advanced HEP for postural strength and flexibility    Status Achieved      PT SHORT TERM GOAL #2   Title report a 30% reduction in bil shoulder pain with sleep at night    Status Achieved             PT Long Term Goals - 08/11/20 1652      PT LONG TERM GOAL #1     Title patient to be independent with advanced HEP    Time 8    Period Weeks    Status New    Target Date 10/06/20      PT LONG TERM GOAL #2   Title reduce FOTO to < or = to 27% limitation    Time 8    Period Weeks    Status New    Target Date 10/06/20      PT LONG TERM GOAL #3   Title patient to demonstrate and report postural corrections to reduce scapular protraction    Time 8    Period Weeks    Status New    Target Date 10/06/20      PT LONG TERM GOAL #4   Title report a 75% reduction in bil shoulder pain at night with sleep    Time 8    Period Weeks    Status New    Target Date 10/06/20                 Plan - 09/02/20 1436    Clinical Impression Statement Pt having less pain today.  He met ST goal for sleeping.  Pt was able to progress strengthening today and added to HEP. Pt will benefit from skilled PT to continue to progress shoulder stability and global shoulder and scap strength.    PT Treatment/Interventions ADLs/Self Care Home Management;Cryotherapy;Electrical Stimulation;Moist Heat;Therapeutic activities;Therapeutic exercise;Neuromuscular re-education;Manual  techniques;Passive range of motion;Dry needling;Taping    PT Next Visit Plan review new HEP if needed, postural strength, global shoulder strength    PT Home Exercise Plan Access Code: [email or web address]    Consulted and Agree with Plan of Care Patient           Patient will benefit from skilled therapeutic intervention in order to improve the following deficits and impairments:  Decreased activity tolerance, Postural dysfunction, Improper body mechanics, Pain, Increased muscle spasms  Visit Diagnosis: Chronic left shoulder pain  Chronic right shoulder pain  Abnormal posture     Problem List Patient Active Problem List   Diagnosis Date Noted  . Pneumonia due to COVID-19 virus 01/26/2020  . Pneumonia due to 2019 novel coronavirus 01/26/2020  . Low back pain radiating to left leg 02/25/2018  . Right shoulder pain 11/19/2016  . PCP NOTES >>>> 11/04/2015  . Annual physical exam 07/05/2012  . SKIN LESIONS, MULTIPLE 10/05/2010  . COLONIC POLYPS 08/15/2007  . Hyperlipidemia 08/15/2007    Jakki L Desenglau, PT 09/02/2020, 2:46 PM   [facility] [address], [address] [place], [place], [address] Phone: [phone]   Fax:  [phone]  Name: Zavion G Knisley MRN: [id number] Date of Birth: 06/16/1951   

## 2020-09-08 ENCOUNTER — Encounter: Payer: Self-pay | Admitting: Internal Medicine

## 2020-09-08 MED ORDER — PRAVASTATIN SODIUM 20 MG PO TABS
20.0000 mg | ORAL_TABLET | Freq: Every day | ORAL | 4 refills | Status: DC
Start: 2020-09-08 — End: 2020-11-30

## 2020-09-09 ENCOUNTER — Ambulatory Visit: Payer: 59

## 2020-09-09 ENCOUNTER — Other Ambulatory Visit: Payer: Self-pay

## 2020-09-09 DIAGNOSIS — M25511 Pain in right shoulder: Secondary | ICD-10-CM

## 2020-09-09 DIAGNOSIS — M25512 Pain in left shoulder: Secondary | ICD-10-CM | POA: Diagnosis not present

## 2020-09-09 DIAGNOSIS — R293 Abnormal posture: Secondary | ICD-10-CM

## 2020-09-09 NOTE — Therapy (Signed)
[facility] [address], [place], [place], [address] Phone: [phone]   Fax:  [phone]  Physical Therapy Treatment  Patient Details  Name: Christopher Santos MRN: [id number] Date of Birth: [birth year] Referring Provider (PT): Karlton Lemon, MD   Encounter Date: 09/09/2020   PT End of Session - 09/09/20 1219    Visit Number 5    Date for PT Re-Evaluation 10/06/20    Authorization Type UHC    PT Start Time 0998    PT Stop Time 1216    PT Time Calculation (min) 31 min    Activity Tolerance Patient tolerated treatment well    Behavior During Therapy [facility] for tasks assessed/performed           Past Medical History:  Diagnosis Date  . Allergy   . Blood transfusion without reported diagnosis    01-26-2020  . COVID-19 virus infection 01/26/2020  . Hyperlipemia   . Hypertension    on occasion but not treated     Past Surgical History:  Procedure Laterality Date  . COLONOSCOPY    . POLYPECTOMY    . TONSILLECTOMY     at age 69    There were no vitals filed for this visit.   Subjective Assessment - 09/09/20 1147    Subjective I feel good once I get going.  Now pain is in the morning.    Currently in Pain? No/denies   up to 5/10- brief in the morning   Pain Location Shoulder    Pain Orientation Right;Left    Pain Descriptors / Indicators Sore                             [facility] Adult PT Treatment/Exercise - 09/09/20 0001      Shoulder Exercises: Supine   Other Supine Exercises serratus punch 5# - 15x each    Other Supine Exercises circles on the ceiling 3lb - 10x each way      Shoulder Exercises: Standing   External Rotation Strengthening;Left;Right;20 reps;Weights    External Rotation Weight (lbs) 20#     Internal Rotation Strengthening;Right;Left;20 reps;Weights    Internal Rotation Weight (lbs) 15#    Extension Strengthening;Both;20 reps    Extension Weight (lbs) 25#    Row  Strengthening;Both;20 reps    Row Weight (lbs) 25#      Shoulder Exercises: ROM/Strengthening   UBE (Upper Arm Bike) Level 2x 6 minutes-fwd/reverse                    PT Short Term Goals - 09/02/20 1404      PT SHORT TERM GOAL #1   Title be independent in advanced HEP for postural strength and flexibility    Status Achieved      PT SHORT TERM GOAL #2   Title report a 30% reduction in bil shoulder pain with sleep at night    Status Achieved             PT Long Term Goals - 08/11/20 1652      PT LONG TERM GOAL #1   Title patient to be independent with advanced HEP    Time 8    Period Weeks    Status New    Target Date 10/06/20      PT LONG TERM GOAL #2   Title reduce FOTO to < or = to 27% limitation    Time 8  Period Weeks    Status New    Target Date 10/06/20      PT LONG TERM GOAL #3   Title patient to demonstrate and report postural corrections to reduce scapular protraction    Time 8    Period Weeks    Status New    Target Date 10/06/20      PT LONG TERM GOAL #4   Title report a 75% reduction in bil shoulder pain at night with sleep    Time 8    Period Weeks    Status New    Target Date 10/06/20                 Plan - 09/09/20 1158    Clinical Impression Statement Pt reports 80% overall improvement in bil shoulder symptoms at night time.  Pt is able to sleep without waking much with pain.  Pt reports max of 5/10 bil shoulder pain in the morning hours and this lasts < 1 hour.  Pt is independent and compliant with HEP for shoulder/scapular strength and endurance.  Pt will continue to benefit from skilled PT to address bil shoulder impingement.    PT Frequency 1x / week    PT Duration 8 weeks    PT Treatment/Interventions ADLs/Self Care Home Management;Cryotherapy;Electrical Stimulation;Moist Heat;Therapeutic activities;Therapeutic exercise;Neuromuscular re-education;Manual techniques;Passive range of motion;Dry needling;Taping    PT Next  Visit Plan postural strength, global shoulder strength    PT Home Exercise Plan Access Code: [email or web address]    Recommended Other Services initial cert is signed    Consulted and Agree with Plan of Care Patient           Patient will benefit from skilled therapeutic intervention in order to improve the following deficits and impairments:  Decreased activity tolerance, Postural dysfunction, Improper body mechanics, Pain, Increased muscle spasms  Visit Diagnosis: Chronic left shoulder pain  Chronic right shoulder pain  Abnormal posture     Problem List Patient Active Problem List   Diagnosis Date Noted  . Pneumonia due to COVID-19 virus 01/26/2020  . Pneumonia due to 2019 novel coronavirus 01/26/2020  . Low back pain radiating to left leg 02/25/2018  . Right shoulder pain 11/19/2016  . PCP NOTES >>>> 11/04/2015  . Annual physical exam 07/05/2012  . SKIN LESIONS, MULTIPLE 10/05/2010  . COLONIC POLYPS 08/15/2007  . Hyperlipidemia 08/15/2007    Sigurd Sos, PT 09/09/20 12:21 PM  [facility] [address], [place], [place], [address] Phone: [phone]   Fax:  [phone]  Name: NYLEN CREQUE MRN: [id number] Date of Birth: 09-May-1951

## 2020-09-16 ENCOUNTER — Ambulatory Visit: Payer: 59 | Admitting: Physical Therapy

## 2020-09-16 ENCOUNTER — Other Ambulatory Visit: Payer: Self-pay

## 2020-09-16 DIAGNOSIS — M25512 Pain in left shoulder: Secondary | ICD-10-CM | POA: Diagnosis not present

## 2020-09-16 DIAGNOSIS — R293 Abnormal posture: Secondary | ICD-10-CM

## 2020-09-16 DIAGNOSIS — G8929 Other chronic pain: Secondary | ICD-10-CM

## 2020-09-16 NOTE — Therapy (Signed)
[facility] [address] [place], [place], [address] Phone: [phone]   Fax:  [phone]  Physical Therapy Treatment  Patient Details  Name: Christopher Santos MRN: [id number] Date of Birth: November 14, 1951 Referring Provider (PT): Karlton Lemon, MD   Encounter Date: 09/16/2020   PT End of Session - 09/16/20 1625    Visit Number 6    Date for PT Re-Evaluation 10/06/20    Authorization Type UHC    PT Start Time 1616    PT Stop Time 1700    PT Time Calculation (min) 44 min    Activity Tolerance Patient tolerated treatment well    Behavior During Therapy [facility] for tasks assessed/performed           Past Medical History:  Diagnosis Date  . Allergy   . Blood transfusion without reported diagnosis    01-26-2020  . COVID-19 virus infection 01/26/2020  . Hyperlipemia   . Hypertension    on occasion but not treated     Past Surgical History:  Procedure Laterality Date  . COLONOSCOPY    . POLYPECTOMY    . TONSILLECTOMY     at age 44    There were no vitals filed for this visit.   Subjective Assessment - 09/16/20 1622    Subjective I have a little on the outside of the left shoulder if I lay on that side.  I am fine other than pain all over that happend on Monday that lasted a couple days.  I don't know what that was from.    Currently in Pain? No/denies                             [facility] Adult PT Treatment/Exercise - 09/16/20 0001      Shoulder Exercises: Prone   Other Prone Exercises primal push up - 10x   cued to exhale with exertion     Shoulder Exercises: Sidelying   Other Sidelying Exercises side plank on knees - 5 up and hold 2 seconds each side      Shoulder Exercises: Standing   External Rotation Strengthening;Left;Right;20 reps;Weights    External Rotation Weight (lbs) 20#     Internal Rotation Strengthening;Right;Left;20 reps;Weights    Internal Rotation Weight (lbs) 20#     Extension Strengthening;Both;20 reps    Extension Weight (lbs) 25#    Row Strengthening;Both;20 reps    Row Weight (lbs) 25#    Row Limitations walking fwd/back 20# in row position - [address]    Other Standing Exercises shoulder flexion punch with serratus press - 20# - 20x bilat    Other Standing Exercises circles both way in 90 deg flexion - 2lb - [address]      Shoulder Exercises: ROM/Strengthening   UBE (Upper Arm Bike) Level 2x 6 minutes-fwd/reverse                    PT Short Term Goals - 09/02/20 1404      PT SHORT TERM GOAL #1   Title be independent in advanced HEP for postural strength and flexibility    Status Achieved      PT SHORT TERM GOAL #2   Title report a 30% reduction in bil shoulder pain with sleep at night    Status Achieved             PT Long Term Goals - 09/16/20 1626  PT LONG TERM GOAL #1   Title patient to be independent with advanced HEP    Status On-going                 Plan - 09/16/20 1711    Clinical Impression Statement Pt continues to progress strength today.  He needs cues to activate core when standing.  He did well with addition of side planks and primal push ups to incorporate his trunk and create more stability for scapular and shoulder movements.  Pt most likely ready for discharge in 1-2 visits    PT Treatment/Interventions ADLs/Self Care Home Management;Cryotherapy;Electrical Stimulation;Moist Heat;Therapeutic activities;Therapeutic exercise;Neuromuscular re-education;Manual techniques;Passive range of motion;Dry needling;Taping    PT Next Visit Plan FOTO, postural strength, global shoulder strength    PT Home Exercise Plan Access Code: [email or web address]    Consulted and Agree with Plan of Care Patient           Patient will benefit from skilled therapeutic intervention in order to improve the following deficits and impairments:  Decreased activity tolerance, Postural dysfunction, Improper body mechanics, Pain,  Increased muscle spasms  Visit Diagnosis: Chronic left shoulder pain  Chronic right shoulder pain  Abnormal posture     Problem List Patient Active Problem List   Diagnosis Date Noted  . Pneumonia due to COVID-19 virus 01/26/2020  . Pneumonia due to 2019 novel coronavirus 01/26/2020  . Low back pain radiating to left leg 02/25/2018  . Right shoulder pain 11/19/2016  . PCP NOTES >>>> 11/04/2015  . Annual physical exam 07/05/2012  . SKIN LESIONS, MULTIPLE 10/05/2010  . COLONIC POLYPS 08/15/2007  . Hyperlipidemia 08/15/2007    Jule Ser, PT 09/16/2020, 5:19 PM  [facility] [address] [place], [place], [address] Phone: [phone]   Fax:  [phone]  Name: Christopher Santos MRN: [id number] Date of Birth: 1951/02/06

## 2020-09-20 ENCOUNTER — Encounter: Payer: Self-pay | Admitting: Family Medicine

## 2020-09-20 ENCOUNTER — Ambulatory Visit (INDEPENDENT_AMBULATORY_CARE_PROVIDER_SITE_OTHER): Payer: 59 | Admitting: Family Medicine

## 2020-09-20 VITALS — BP 114/70 | Ht 75.0 in | Wt 200.0 lb

## 2020-09-20 DIAGNOSIS — M25561 Pain in right knee: Secondary | ICD-10-CM | POA: Diagnosis not present

## 2020-09-20 DIAGNOSIS — M7540 Impingement syndrome of unspecified shoulder: Secondary | ICD-10-CM

## 2020-09-20 NOTE — Progress Notes (Signed)
PCP: Colon Branch, MD  Subjective:   HPI: Patient is a 69 y.o. male here for bilateral shoulder, right knee pain.  8/16: Christopher Santos states that his right shoulder has been hurting him for several years and he has been seen at this clinic before for it. States the pain mostly resolved after the 2018 visit but has reoccurred in the past couple months.  In the last 1 month, patient has new left shoulder pain that is identical to his right shoulder pain.  He denies any recent injuries, trauma, falls that may have led to this.  He notes that the pain is worse in the anterior aspect of the shoulder with some radiation down his arm stopping halfway before reaching the elbow.  He has difficulty raising his left arm all the way due to pain.  At night, occasionally the pain will wake him up if he makes a sudden movement and is has caused significant deterioration in his sleep quality.  Has been using Advil with some mild relief of pain.  Denies any repetitive motions as he remembers that led to his previous rotator cuff impingement in 2018.  Tali also endorses bilateral knee hip pain that began shortly after switching his statin to Lipitor. He has discussed this with his GP who recommended cessation of Lipitor for 3 weeks to see if pain resolves.  9/27: Patient reports his shoulders have improved following injections, physical therapy, home exercises. No night pain. Gets intermittent pain more in left shoulder lateral upper arm at times. Can bother with overhead motions. Also with lateral right knee pain that has worsened some since last visit. No new injuries.  Past Medical History:  Diagnosis Date  . Allergy   . Blood transfusion without reported diagnosis    01-26-2020  . COVID-19 virus infection 01/26/2020  . Hyperlipemia   . Hypertension    on occasion but not treated     Current Outpatient Medications on File Prior to Visit  Medication Sig Dispense Refill  . Ascorbic Acid (VITAMIN C) 1000 MG  tablet Take 1,000 mg by mouth daily.    Marland Kitchen aspirin 81 MG tablet Take 81 mg by mouth at bedtime.     . pravastatin (PRAVACHOL) 20 MG tablet Take 1 tablet (20 mg total) by mouth at bedtime. 30 tablet 4   No current facility-administered medications on file prior to visit.    Past Surgical History:  Procedure Laterality Date  . COLONOSCOPY    . POLYPECTOMY    . TONSILLECTOMY     at age 34    No Known Allergies  Social History   Socioeconomic History  . Marital status: Married    Spouse name: Not on file  . Number of children: 0  . Years of education: Not on file  . Highest education level: Not on file  Occupational History  . Occupation: IT , [employer] (used to be [employer])  Tobacco Use  . Smoking status: Former Smoker    Quit date: 1993    Years since quitting: 28.7  . Smokeless tobacco: Never Used  . Tobacco comment: quit 1993, no heavy use   Vaping Use  . Vaping Use: Never used  Substance and Sexual Activity  . Alcohol use: Yes    Alcohol/week: 3.0 standard drinks    Types: 3 Cans of beer per week    Comment: wine  . Drug use: No  . Sexual activity: Not on file  Other Topics Concern  . Not on file  Social History Narrative   Married, no children, original from [place], works for [employer] (doing the same he did at [employer])    Social Determinants of Radio broadcast assistant Strain:   . Difficulty of Paying Living Expenses: Not on file  Food Insecurity:   . Worried About Charity fundraiser in the Last Year: Not on file  . Ran Out of Food in the Last Year: Not on file  Transportation Needs:   . Lack of Transportation (Medical): Not on file  . Lack of Transportation (Non-Medical): Not on file  Physical Activity:   . Days of Exercise per Week: Not on file  . Minutes of Exercise per Session: Not on file  Stress:   . Feeling of Stress : Not on file  Social Connections:   . Frequency of Communication with Friends and Family: Not on file  . Frequency of Social Gatherings with  Friends and Family: Not on file  . Attends Religious Services: Not on file  . Active Member of Clubs or Organizations: Not on file  . Attends Archivist Meetings: Not on file  . Marital Status: Not on file  Intimate Partner Violence:   . Fear of Current or Ex-Partner: Not on file  . Emotionally Abused: Not on file  . Physically Abused: Not on file  . Sexually Abused: Not on file    Family History  Problem Relation Age of Onset  . CAD Father 61       MI at 14  . Diabetes Neg Hx   . Colon cancer Neg Hx   . Prostate cancer Neg Hx   . Colon polyps Neg Hx   . Esophageal cancer Neg Hx   . Rectal cancer Neg Hx   . Stomach cancer Neg Hx     BP 114/70   Ht 6\' 3"  (1.905 m)   Wt 200 lb (90.7 kg)   BMI 25.00 kg/m   [facility] Adult Exercise 09/20/2020  Frequency of aerobic exercise (# of days/week) 3  Average time in minutes 20  Frequency of strengthening activities (# of days/week) 0    No flowsheet data found.  Review of Systems: See HPI above.     Objective:  Physical Exam:  Gen: NAD, comfortable in exam room  Right shoulder: No swelling, ecchymoses.  No gross deformity. No TTP. FROM. Negative Hawkins, Neers. Negative Yergasons. Strength 5/5 with empty can and resisted internal/external rotation. NV intact distally.  Left shoulder: No swelling, ecchymoses.  No gross deformity. No TTP. FROM. Positive Hawkins, Neers. Negative Yergasons. Strength 5/5 with empty can and resisted internal/external rotation. NV intact distally.  Right knee: No gross deformity, ecchymoses, swelling. TTP lateral joint line. FROM with full strength. Negative ant/post drawers. Negative valgus/varus testing. Negative lachmans. Positive mcmurrays, apleys, negative patellar apprehension. NV intact distally.   Assessment & Plan:  1. Bilateral shoulder pain - 2/2 rotator cuff impingement.  Improving with subacromial injections and physical therapy, home  exercises.  Continue this with transition to home program.  Aleve or ibuprofen if needed.  Consider repeat subacromial injections, nitro patches if he worsens.  F/u in 6 weeks.  2. Right knee pain - 2/2 degenerative meniscus tear.  No mechanical symptoms.  Start home exercise program.  Avoid deep squats, lunges.  Consider formal PT, injection, MRI if not improving as expected.  F/u in 6 weeks.

## 2020-09-20 NOTE — Patient Instructions (Signed)
Finish physical therapy for your shoulders. Do home exercises for about 6 more weeks though. Aleve or ibuprofen if needed. We can repeat the injections if this worsens.  Your knee pain is due to a degenerative lateral meniscus tear. Start quad strengthening exercises. Avoid deep squats, deep lunges, twisting when possible. Can consider formal therapy for this, injection, MRI if not improving as expected. Follow up with me in 6 weeks.

## 2020-10-01 ENCOUNTER — Encounter: Payer: Self-pay | Admitting: Physical Therapy

## 2020-10-01 ENCOUNTER — Other Ambulatory Visit: Payer: Self-pay

## 2020-10-01 ENCOUNTER — Ambulatory Visit: Payer: 59 | Attending: Family Medicine | Admitting: Physical Therapy

## 2020-10-01 DIAGNOSIS — R293 Abnormal posture: Secondary | ICD-10-CM | POA: Diagnosis present

## 2020-10-01 DIAGNOSIS — M25511 Pain in right shoulder: Secondary | ICD-10-CM | POA: Insufficient documentation

## 2020-10-01 DIAGNOSIS — M25512 Pain in left shoulder: Secondary | ICD-10-CM | POA: Insufficient documentation

## 2020-10-01 DIAGNOSIS — G8929 Other chronic pain: Secondary | ICD-10-CM | POA: Diagnosis present

## 2020-10-01 NOTE — Therapy (Signed)
[facility] [address] [place], [place], [place], [address] Phone: [phone]   Fax:  [phone]  Physical Therapy Treatment  Patient Details  Name: Christopher Santos MRN: [id number] Date of Birth: [birth year] Referring Provider (PT): Karlton Lemon, MD   Encounter Date: 10/01/2020   PT End of Session - 10/01/20 1101    Visit Number 7    Date for PT Re-Evaluation 10/06/20    Authorization Type UHC    PT Start Time 1101    PT Stop Time 1140    PT Time Calculation (min) 39 min    Activity Tolerance Patient tolerated treatment well    Behavior During Therapy [facility] for tasks assessed/performed           Past Medical History:  Diagnosis Date   Allergy    Blood transfusion without reported diagnosis    01-26-2020   COVID-19 virus infection 01/26/2020   Hyperlipemia    Hypertension    on occasion but not treated     Past Surgical History:  Procedure Laterality Date   COLONOSCOPY     POLYPECTOMY     TONSILLECTOMY     at age 69    There were no vitals filed for this visit.   Subjective Assessment - 10/01/20 1103    Subjective It has been bad.  I think the shots have worn off.  It is worse in the morning and was very hard to sleep because I snore on my back and that wakes me up.  I have to switch sides alot    Currently in Pain? Yes    Pain Score 8     Pain Location Shoulder    Pain Orientation Right;Left    Pain Descriptors / Indicators Sore    Pain Type Chronic pain    Pain Onset More than a month ago    Pain Frequency Constant    Aggravating Factors  hard when getting up in the morning    Pain Relieving Factors moving around during the day helps a little    Effect of Pain on Daily Activities sleep    Multiple Pain Sites No                             [facility] Adult PT Treatment/Exercise - 10/01/20 0001      Therapeutic Activites    Therapeutic Activities Other Therapeutic Activities     Other Therapeutic Activities half side lying with pillows to keep from going all the way to the back; one pillow under Rt arm; behind upper back and btw LE with knees bent      Shoulder Exercises: Supine   Other Supine Exercises thoracic ext and goal post stretch - 10 sec x 5      Shoulder Exercises: ROM/Strengthening   UBE (Upper Arm Bike) Level 1x 6 minutes-fwd/reverse   PT present for status; higher pain today L1     Modalities   Modalities Moist Heat      Moist Heat Therapy   Number Minutes Moist Heat 5 Minutes   alt sides when doing STM   Moist Heat Location Shoulder      Manual Therapy   Manual Therapy Soft tissue mobilization    Soft tissue mobilization STM pec mojor and ant delt on Lt; pec minor on Rt                    PT  Short Term Goals - 09/02/20 1404      PT SHORT TERM GOAL #1   Title be independent in advanced HEP for postural strength and flexibility    Status Achieved      PT SHORT TERM GOAL #2   Title report a 30% reduction in bil shoulder pain with sleep at night    Status Achieved             PT Long Term Goals - 09/16/20 1626      PT LONG TERM GOAL #1   Title patient to be independent with advanced HEP    Status On-going                 Plan - 10/01/20 1143    Clinical Impression Statement Pt arrived this morning stating his pain is back to where it was before getting the injections.  Pt has not had worsening of pain with the exercises but pain gets worse at night when sleeping and is very disruptive to sleep.  Pt has pain worse on Lt and [employer] test positive Lt , neg Rt and Neer impingement test pos bilat. Stiff flexion and abduction PROM Lt worse than Rt. Pt was educated on sleeping position with pillows to get rid of pressure on shoulder.  Manual to pec major on Lt and anterior deltoid and pec minor on Rt helped to lengthen soft tissue mildly but muscles are very stiff.  Pt had no increased pain and was given additional  stretches and seen in HEP.  Pt with one more visit to finalize HEP and most likely return to MD if he has not progressed.    PT Treatment/Interventions ADLs/Self Care Home Management;Cryotherapy;Electrical Stimulation;Moist Heat;Therapeutic activities;Therapeutic exercise;Neuromuscular re-education;Manual techniques;Passive range of motion;Dry needling;Taping    PT Next Visit Plan FOTO, goals, final HEP; review pec stretches and UE position at work station to ensure not over activating his UT and pecs    PT Home Exercise Plan Access Code: [email or web address]    Consulted and Agree with Plan of Care Patient           Patient will benefit from skilled therapeutic intervention in order to improve the following deficits and impairments:  Decreased activity tolerance, Postural dysfunction, Improper body mechanics, Pain, Increased muscle spasms  Visit Diagnosis: Chronic left shoulder pain  Chronic right shoulder pain  Abnormal posture     Problem List Patient Active Problem List   Diagnosis Date Noted   Pneumonia due to COVID-19 virus 01/26/2020   Pneumonia due to 2019 novel coronavirus 01/26/2020   Low back pain radiating to left leg 02/25/2018   Right shoulder pain 11/19/2016   PCP NOTES >>>> 11/04/2015   Annual physical exam 07/05/2012   SKIN LESIONS, MULTIPLE 10/05/2010   COLONIC POLYPS 08/15/2007   Hyperlipidemia 08/15/2007    Christopher Santos, PT 10/01/2020, 12:03 PM  [facility] [address] [place], [place], [address] Phone: [phone]   Fax:  [phone]  Name: Christopher Santos MRN: [id number] Date of Birth: [birth year]

## 2020-10-06 ENCOUNTER — Ambulatory Visit: Payer: 59

## 2020-10-06 ENCOUNTER — Other Ambulatory Visit: Payer: Self-pay

## 2020-10-06 DIAGNOSIS — G8929 Other chronic pain: Secondary | ICD-10-CM

## 2020-10-06 DIAGNOSIS — M25512 Pain in left shoulder: Secondary | ICD-10-CM | POA: Diagnosis not present

## 2020-10-06 DIAGNOSIS — R293 Abnormal posture: Secondary | ICD-10-CM

## 2020-10-06 DIAGNOSIS — M25511 Pain in right shoulder: Secondary | ICD-10-CM

## 2020-10-06 NOTE — Therapy (Signed)
[facility] [address], [place], [place], [address] Phone: [phone]   Fax:  [phone]  Physical Therapy Treatment  Patient Details  Name: KEILAN NICHOL MRN: [id number] Date of Birth: 13-Aug-1951 Referring Provider (PT): Karlton Lemon, MD   Encounter Date: 10/06/2020   PT End of Session - 10/06/20 1157    Visit Number 8    Date for PT Re-Evaluation 10/06/20    Authorization Type UHC    PT Start Time 4628    PT Stop Time 1214    PT Time Calculation (min) 26 min    Activity Tolerance Patient tolerated treatment well    Behavior During Therapy [facility] for tasks assessed/performed           Past Medical History:  Diagnosis Date  . Allergy   . Blood transfusion without reported diagnosis    01-26-2020  . COVID-19 virus infection 01/26/2020  . Hyperlipemia   . Hypertension    on occasion but not treated     Past Surgical History:  Procedure Laterality Date  . COLONOSCOPY    . POLYPECTOMY    . TONSILLECTOMY     at age 69    There were no vitals filed for this visit.   Subjective Assessment - 10/06/20 1151    Subjective I am feeling better than last session.  I started taking Tylenol arthritis and it might have helped.              [facility] PT Assessment - 10/06/20 0001      Assessment   Medical Diagnosis rotator cuff impingement syndrome, unspecified laterality    Referring Provider (PT) Karlton Lemon, MD    Onset Date/Surgical Date 08/11/17   Lt shoulder 1 month history     Cognition   Overall Cognitive Status Within Functional Limits for tasks assessed      Observation/Other Assessments   Focus on Therapeutic Outcomes (FOTO)  43% limitation                         [facility] Adult PT Treatment/Exercise - 10/06/20 0001      Shoulder Exercises: Standing   Other Standing Exercises rotation with dowel x 10       Shoulder Exercises: ROM/Strengthening   UBE (Upper Arm Bike) Level  1.5 x 6 minutes-fwd/reverse                  PT Education - 10/06/20 1217    Education Details verbal review of all HEP and how to modify and progress after D/C using Medbridge visuals    Person(s) Educated Patient    Methods Explanation;Handout    Comprehension Verbalized understanding            PT Short Term Goals - 09/02/20 1404      PT SHORT TERM GOAL #1   Title be independent in advanced HEP for postural strength and flexibility    Status Achieved      PT SHORT TERM GOAL #2   Title report a 30% reduction in bil shoulder pain with sleep at night    Status Achieved             PT Long Term Goals - 10/06/20 1152      PT LONG TERM GOAL #1   Title patient to be independent with advanced HEP    Status Achieved      PT LONG TERM GOAL #2   Title reduce  FOTO to < or = to 27% limitation    Status Not Met      PT LONG TERM GOAL #3   Title patient to demonstrate and report postural corrections to reduce scapular protraction    Status Achieved      PT LONG TERM GOAL #4   Title report a 75% reduction in bil shoulder pain at night with sleep    Baseline 80-90% better    Status Achieved                 Plan - 10/06/20 1217    Clinical Impression Statement Pt reports 80-90% overall improvement in bil shoulder symptoms since the start of care.  Pt is sleeping better without interruption due to pain.  Pt is independent and compliant in HEP for postural strength, pec flexibility and core strength to address bil shoulder pain.  Pt will D/C to HEP today.    PT Next Visit Plan D/C PT to HEP    PT Home Exercise Plan Access Code: [email or web address]    Consulted and Agree with Plan of Care Patient           Patient will benefit from skilled therapeutic intervention in order to improve the following deficits and impairments:     Visit Diagnosis: Chronic left shoulder pain  Chronic right shoulder pain  Abnormal posture     Problem List Patient Active Problem  List   Diagnosis Date Noted  . Pneumonia due to COVID-19 virus 01/26/2020  . Pneumonia due to 2019 novel coronavirus 01/26/2020  . Low back pain radiating to left leg 02/25/2018  . Right shoulder pain 11/19/2016  . PCP NOTES >>>> 11/04/2015  . Annual physical exam 07/05/2012  . SKIN LESIONS, MULTIPLE 10/05/2010  . COLONIC POLYPS 08/15/2007  . Hyperlipidemia 08/15/2007   PHYSICAL THERAPY DISCHARGE SUMMARY  Visits from Start of Care: 8  Current functional level related to goals / functional outcomes: See above for current status.   Remaining deficits: Pt with intermittent bil shoulder pain.  80-90% overall improvement since the start of care.  Pt with forward shoulder posture and will continue with HEP and postural corrections at home and work.     Education / Equipment: HEP, posture  Plan: Patient agrees to discharge.  Patient goals were partially met. Patient is being discharged due to being pleased with the current functional level.  ?????         Sigurd Sos, PT 10/06/20 12:22 PM  [facility] [address] [place], [place], [address] Phone: [phone]   Fax:  [phone]  Name: CURBY CARSWELL MRN: [id number] Date of Birth: 07-24-1951

## 2020-11-01 ENCOUNTER — Ambulatory Visit (INDEPENDENT_AMBULATORY_CARE_PROVIDER_SITE_OTHER): Payer: 59 | Admitting: Family Medicine

## 2020-11-01 ENCOUNTER — Other Ambulatory Visit: Payer: Self-pay

## 2020-11-01 ENCOUNTER — Encounter: Payer: Self-pay | Admitting: Family Medicine

## 2020-11-01 VITALS — BP 118/82 | Ht 75.0 in | Wt 200.0 lb

## 2020-11-01 DIAGNOSIS — M25511 Pain in right shoulder: Secondary | ICD-10-CM

## 2020-11-01 DIAGNOSIS — G8929 Other chronic pain: Secondary | ICD-10-CM

## 2020-11-01 DIAGNOSIS — M25512 Pain in left shoulder: Secondary | ICD-10-CM | POA: Diagnosis not present

## 2020-11-01 DIAGNOSIS — M7540 Impingement syndrome of unspecified shoulder: Secondary | ICD-10-CM

## 2020-11-01 MED ORDER — METHYLPREDNISOLONE ACETATE 40 MG/ML IJ SUSP
40.0000 mg | Freq: Once | INTRAMUSCULAR | Status: AC
  Administered 2020-11-01: 40 mg via INTRA_ARTICULAR

## 2020-11-01 MED ORDER — NITROGLYCERIN 0.2 MG/HR TD PT24
MEDICATED_PATCH | TRANSDERMAL | 2 refills | Status: DC
Start: 2020-11-01 — End: 2021-02-16

## 2020-11-01 NOTE — Patient Instructions (Signed)
We repeated the injections in your shoulders today. Wait about 5 days before restarting the home exercises. Start the nitro patches now - 1/4th patch to left shoulder, change this every day. After a few days you can put 1/4th of a patch on both shoulders and change them daily. Follow up with me in 6 weeks.

## 2020-11-01 NOTE — Progress Notes (Signed)
PCP: Colon Branch, MD  Subjective:   HPI: Patient is a 69 y.o. male here for bilateral shoulder, right knee pain.  8/16: Christopher Santos states that his right shoulder has been hurting him for several years and he has been seen at this clinic before for it. States the pain mostly resolved after the 2018 visit but has reoccurred in the past couple months.  In the last 1 month, patient has new left shoulder pain that is identical to his right shoulder pain.  He denies any recent injuries, trauma, falls that may have led to this.  He notes that the pain is worse in the anterior aspect of the shoulder with some radiation down his arm stopping halfway before reaching the elbow.  He has difficulty raising his left arm all the way due to pain.  At night, occasionally the pain will wake him up if he makes a sudden movement and is has caused significant deterioration in his sleep quality.  Has been using Advil with some mild relief of pain.  Denies any repetitive motions as he remembers that led to his previous rotator cuff impingement in 2018.  Christopher Santos also endorses bilateral knee hip pain that began shortly after switching his statin to Lipitor. He has discussed this with his GP who recommended cessation of Lipitor for 3 weeks to see if pain resolves.  9/27: Patient reports his shoulders have improved following injections, physical therapy, home exercises. No night pain. Gets intermittent pain more in left shoulder lateral upper arm at times. Can bother with overhead motions. Also with lateral right knee pain that has worsened some since last visit. No new injuries.  11/8: Patient reports while his knee has improved some, his shoulders have not and may be worse than last visit. Left shoulder worse than right. Worse lying on this and is waking him at night. Done physical therapy and still doing home exercises. Pain is lateral in both shoulders. No numbness or tingling. No radiation past elbows. Worse with  overhead motions.  Past Medical History:  Diagnosis Date  . Allergy   . Blood transfusion without reported diagnosis    01-26-2020  . COVID-19 virus infection 01/26/2020  . Hyperlipemia   . Hypertension    on occasion but not treated     Current Outpatient Medications on File Prior to Visit  Medication Sig Dispense Refill  . Ascorbic Acid (VITAMIN C) 1000 MG tablet Take 1,000 mg by mouth daily.    Marland Kitchen aspirin 81 MG tablet Take 81 mg by mouth at bedtime.     . pravastatin (PRAVACHOL) 20 MG tablet Take 1 tablet (20 mg total) by mouth at bedtime. 30 tablet 4   No current facility-administered medications on file prior to visit.    Past Surgical History:  Procedure Laterality Date  . COLONOSCOPY    . POLYPECTOMY    . TONSILLECTOMY     at age 21    No Known Allergies  Social History   Socioeconomic History  . Marital status: Married    Spouse name: Not on file  . Number of children: 0  . Years of education: Not on file  . Highest education level: Not on file  Occupational History  . Occupation: IT , [employer] (used to be [employer])  Tobacco Use  . Smoking status: Former Smoker    Quit date: 1993    Years since quitting: 28.8  . Smokeless tobacco: Never Used  . Tobacco comment: quit 1993, no heavy use  Vaping Use  . Vaping Use: Never used  Substance and Sexual Activity  . Alcohol use: Yes    Alcohol/week: 3.0 standard drinks    Types: 3 Cans of beer per week    Comment: wine  . Drug use: No  . Sexual activity: Not on file  Other Topics Concern  . Not on file  Social History Narrative   Married, no children, original from [place], works for [employer] (doing the same he did at [employer])    Social Determinants of Radio broadcast assistant Strain:   . Difficulty of Paying Living Expenses: Not on file  Food Insecurity:   . Worried About Charity fundraiser in the Last Year: Not on file  . Ran Out of Food in the Last Year: Not on file  Transportation Needs:   . Lack of  Transportation (Medical): Not on file  . Lack of Transportation (Non-Medical): Not on file  Physical Activity:   . Days of Exercise per Week: Not on file  . Minutes of Exercise per Session: Not on file  Stress:   . Feeling of Stress : Not on file  Social Connections:   . Frequency of Communication with Friends and Family: Not on file  . Frequency of Social Gatherings with Friends and Family: Not on file  . Attends Religious Services: Not on file  . Active Member of Clubs or Organizations: Not on file  . Attends Archivist Meetings: Not on file  . Marital Status: Not on file  Intimate Partner Violence:   . Fear of Current or Ex-Partner: Not on file  . Emotionally Abused: Not on file  . Physically Abused: Not on file  . Sexually Abused: Not on file    Family History  Problem Relation Age of Onset  . CAD Father 22       MI at 75  . Diabetes Neg Hx   . Colon cancer Neg Hx   . Prostate cancer Neg Hx   . Colon polyps Neg Hx   . Esophageal cancer Neg Hx   . Rectal cancer Neg Hx   . Stomach cancer Neg Hx     BP 118/82   Ht 6\' 3"  (1.905 m)   Wt 200 lb (90.7 kg)   BMI 25.00 kg/m   [place] Adult Exercise 09/20/2020 11/01/2020  Frequency of aerobic exercise (# of days/week) 3 3  Average time in minutes 20 20  Frequency of strengthening activities (# of days/week) 0 0    No flowsheet data found.  Review of Systems: See HPI above.     Objective:  Physical Exam:  Gen: NAD, comfortable in exam room  Right shoulder: No swelling, ecchymoses.  No gross deformity. No TTP. FROM with painful arc. Negative Hawkins, Neers. Negative Yergasons. Strength 5/5 with empty can and resisted internal/external rotation. Negative apprehension. NV intact distally.  Left shoulder: No swelling, ecchymoses.  No gross deformity. No TTP. FROM with painful arc. Negative Hawkins, Neers. Negative Yergasons. Strength 5/5 with empty can and resisted internal/external  rotation. Negative apprehension. NV intact distally.   Assessment & Plan:  1. Bilateral shoulder pain - 2/2 rotator cuff impingement.  Pain only transiently improved with subacromial injections, physical therapy, home exercises.  Discussed options - will repeat injections today, start nitro patches.  Restart home exercises in about 5 days.  F/u in 6 weeks.  After informed written consent timeout was performed, patient was seated in chair in exam room. Right shoulder  was prepped with alcohol swab and utilizing lateral approach with ultrasound guidance, patient's right subacromial space was injected with 3:1 bupivicaine: depomedrol. Patient tolerated the procedure well without immediate complications.  After informed written consent timeout was performed, patient was seated in chair in exam room. Left shoulder was prepped with alcohol swab and utilizing lateral approach with ultrasound guidance, patient's left subacromial space was injected with 3:1 bupivicaine: depomedrol. Patient tolerated the procedure well without immediate complications.

## 2020-11-26 ENCOUNTER — Ambulatory Visit (INDEPENDENT_AMBULATORY_CARE_PROVIDER_SITE_OTHER): Payer: 59 | Admitting: Internal Medicine

## 2020-11-26 ENCOUNTER — Encounter: Payer: Self-pay | Admitting: Internal Medicine

## 2020-11-26 ENCOUNTER — Other Ambulatory Visit: Payer: Self-pay

## 2020-11-26 VITALS — BP 142/79 | HR 62 | Temp 97.9°F | Resp 16 | Ht 75.0 in | Wt 199.8 lb

## 2020-11-26 DIAGNOSIS — Z0000 Encounter for general adult medical examination without abnormal findings: Secondary | ICD-10-CM

## 2020-11-26 DIAGNOSIS — M2550 Pain in unspecified joint: Secondary | ICD-10-CM

## 2020-11-26 LAB — CBC WITH DIFFERENTIAL/PLATELET
Basophils Absolute: 0 10*3/uL (ref 0.0–0.1)
Basophils Relative: 0.6 % (ref 0.0–3.0)
Eosinophils Absolute: 0.2 10*3/uL (ref 0.0–0.7)
Eosinophils Relative: 3.8 % (ref 0.0–5.0)
HCT: 40.4 % (ref 39.0–52.0)
Hemoglobin: 13.7 g/dL (ref 13.0–17.0)
Lymphocytes Relative: 36.6 % (ref 12.0–46.0)
Lymphs Abs: 1.6 10*3/uL (ref 0.7–4.0)
MCHC: 34 g/dL (ref 30.0–36.0)
MCV: 97.1 fl (ref 78.0–100.0)
Monocytes Absolute: 0.7 10*3/uL (ref 0.1–1.0)
Monocytes Relative: 14.7 % — ABNORMAL HIGH (ref 3.0–12.0)
Neutro Abs: 2 10*3/uL (ref 1.4–7.7)
Neutrophils Relative %: 44.3 % (ref 43.0–77.0)
Platelets: 290 10*3/uL (ref 150.0–400.0)
RBC: 4.16 Mil/uL — ABNORMAL LOW (ref 4.22–5.81)
RDW: 14.2 % (ref 11.5–15.5)
WBC: 4.4 10*3/uL (ref 4.0–10.5)

## 2020-11-26 LAB — COMPREHENSIVE METABOLIC PANEL
ALT: 18 U/L (ref 0–53)
AST: 17 U/L (ref 0–37)
Albumin: 4.4 g/dL (ref 3.5–5.2)
Alkaline Phosphatase: 69 U/L (ref 39–117)
BUN: 17 mg/dL (ref 6–23)
CO2: 30 mEq/L (ref 19–32)
Calcium: 9.5 mg/dL (ref 8.4–10.5)
Chloride: 105 mEq/L (ref 96–112)
Creatinine, Ser: 0.74 mg/dL (ref 0.40–1.50)
GFR: 93.63 mL/min (ref >60.00)
Glucose, Bld: 91 mg/dL (ref 70–99)
Potassium: 4.3 mEq/L (ref 3.5–5.1)
Sodium: 141 mEq/L (ref 135–145)
Total Bilirubin: 0.5 mg/dL (ref 0.2–1.2)
Total Protein: 6.7 g/dL (ref 6.0–8.3)

## 2020-11-26 LAB — SEDIMENTATION RATE: Sed Rate: 5 mm/hr (ref 0–20)

## 2020-11-26 LAB — LIPID PANEL
Cholesterol: 162 mg/dL (ref 0–200)
HDL: 42.1 mg/dL (ref >39.00)
LDL Cholesterol: 104 mg/dL — ABNORMAL HIGH (ref 0–99)
NonHDL: 120.21
Total CHOL/HDL Ratio: 4
Triglycerides: 80 mg/dL (ref 0.0–149.0)
VLDL: 16 mg/dL (ref 0.0–40.0)

## 2020-11-26 LAB — TSH: TSH: 1.66 u[IU]/mL (ref 0.35–4.50)

## 2020-11-26 NOTE — Patient Instructions (Signed)
    GO TO THE LAB : Get the blood work     GO TO THE FRONT DESK, PLEASE SCHEDULE YOUR APPOINTMENTS Come back for a checkup in 6 months 

## 2020-11-26 NOTE — Progress Notes (Addendum)
Subjective:    Patient ID: Christopher Santos, male    DOB: December 25, 1951, 69 y.o.   MRN: [id number]  DOS:  11/26/2020 Type of visit - description: CPX Since the last office visit he is having joint pains, at different locations, sometimes elbows, sometimes hips or shoulders. Seen by sports medicine. Symptoms a started after Covid.   BP Readings from Last 3 Encounters:  11/26/20 (!) 142/79  11/01/20 118/82  09/20/20 114/70     Review of Systems  Other than above, a 14 point review of systems is negative      Past Medical History:  Diagnosis Date   Allergy    Blood transfusion without reported diagnosis    01-26-2020   COVID-19 virus infection 01/26/2020   Hyperlipemia    Hypertension    on occasion but not treated     Past Surgical History:  Procedure Laterality Date   COLONOSCOPY     POLYPECTOMY     TONSILLECTOMY     at age 61    Allergies as of 11/26/2020   No Known Allergies     Medication List       Accurate as of November 26, 2020 11:59 PM. If you have any questions, ask your nurse or doctor.        aspirin 81 MG tablet Take 81 mg by mouth at bedtime.   nitroGLYCERIN 0.2 mg/hr patch Commonly known as: NITRODUR - Dosed in mg/24 hr Apply 1/4th patch to affected shoulders, change daily   pravastatin 20 MG tablet Commonly known as: PRAVACHOL Take 1 tablet (20 mg total) by mouth at bedtime.   vitamin C 1000 MG tablet Take 1,000 mg by mouth daily.          Objective:   Physical Exam BP (!) 142/79 (BP Location: Left Arm, Patient Position: Sitting, Cuff Size: Small)    Pulse 62    Temp 97.9 F (36.6 C) (Oral)    Resp 16    Ht 6\' 3"  (1.905 m)    Wt 199 lb 12.8 oz (90.6 kg)    SpO2 99%    BMI 24.97 kg/m  General: Well developed, NAD, BMI noted Neck: No  thyromegaly  HEENT:  Normocephalic . Face symmetric, atraumatic Lungs:  CTA B Normal respiratory effort, no intercostal retractions, no accessory muscle use. Heart: RRR,  no murmur.   Abdomen:  Not distended, soft, non-tender. No rebound or rigidity.   Lower extremities: no pretibial edema bilaterally  Skin: Exposed areas without rash. Not pale. Not jaundice Neurologic:  alert & oriented X3.  Speech normal, gait appropriate for age and unassisted Strength symmetric and appropriate for age.  Psych: Cognition and judgment appear intact.  Cooperative with normal attention span and concentration.  Behavior appropriate. No anxious or depressed appearing.     Assessment     Assessment  Dyslipidemia L wrist Fx 2015  Fever blisters- valtrex rarely Covid dx ~ 01/2020   PLAN: Here for CPX Dyslipidemia:  In the past Simva was switch to atorvastatin to get better control. Then Atorvastatin was switch to Pravachol due to aches and pains (08-2020), still has aches and pains, joint issues seem to be Dealer, f/u by  sports medicine. We will check a sed rate and b/c has taken statins throughout the time he has joint problems, at some point he may like to stop Pravachol completely for 2 months and see if that helps Arthralgias: as above RTC 6 months  This visit occurred during the SARS-CoV-2  public health emergency.  Safety protocols were in place, including screening questions prior to the visit, additional usage of staff PPE, and extensive cleaning of exam room while observing appropriate contact time as indicated for disinfecting solutions.

## 2020-11-28 ENCOUNTER — Encounter: Payer: Self-pay | Admitting: Internal Medicine

## 2020-11-28 NOTE — Assessment & Plan Note (Signed)
-  Tdap 2013 -  zostavax: 2014 ;  s/p shingrix x 2 -  Prevnar: 2019;  PNM 23: 10-2019 - covid vax x 2 , booster rec  -  Had a  flu shot   -CCS: colonoscopy in July 2004 -- bx- tubular adenomas and hyperplastic polyps. colonoscopy 01/2008 colon two polyps, tics, hemorrhoids.   Cscope 09-2013, 03/2020, next per GI -Prostate cancer screening: DRE, PSA 2020 -Diet discussed.  Despite arthralgias pt stays active. -Labs: CMP, FLP, CBC, TSH, sed rate

## 2020-11-28 NOTE — Assessment & Plan Note (Addendum)
Here for CPX Dyslipidemia:  In the past Simva was switch to atorvastatin to get better control. Then Atorvastatin was switch to Pravachol due to aches and pains (08-2020), still has aches and pains, joint issues seem to be Dealer, f/u by  sports medicine. We will check a sed rate and b/c has taken statins throughout the time he has joint problems, at some point he may like to stop Pravachol completely for 2 months and see if that helps Arthralgias: as above RTC 6 months

## 2020-11-30 MED ORDER — PRAVASTATIN SODIUM 40 MG PO TABS: 40.0000 mg | ORAL_TABLET | Freq: Every day | ORAL | 1 refills | Status: DC

## 2020-11-30 NOTE — Addendum Note (Signed)
Addended byDamita Dunnings D on: 11/30/2020 10:30 AM   Modules accepted: Orders

## 2020-12-13 ENCOUNTER — Ambulatory Visit (INDEPENDENT_AMBULATORY_CARE_PROVIDER_SITE_OTHER): Payer: 59 | Admitting: Family Medicine

## 2020-12-13 ENCOUNTER — Other Ambulatory Visit: Payer: Self-pay

## 2020-12-13 ENCOUNTER — Encounter: Payer: Self-pay | Admitting: Family Medicine

## 2020-12-13 ENCOUNTER — Ambulatory Visit: Payer: Self-pay

## 2020-12-13 VITALS — BP 108/70 | Ht 75.0 in | Wt 200.0 lb

## 2020-12-13 DIAGNOSIS — M25512 Pain in left shoulder: Secondary | ICD-10-CM

## 2020-12-13 DIAGNOSIS — M25511 Pain in right shoulder: Secondary | ICD-10-CM | POA: Diagnosis not present

## 2020-12-13 DIAGNOSIS — G8929 Other chronic pain: Secondary | ICD-10-CM | POA: Diagnosis not present

## 2020-12-13 DIAGNOSIS — M67911 Unspecified disorder of synovium and tendon, right shoulder: Secondary | ICD-10-CM

## 2020-12-13 DIAGNOSIS — M67912 Unspecified disorder of synovium and tendon, left shoulder: Secondary | ICD-10-CM

## 2020-12-13 NOTE — Assessment & Plan Note (Signed)
This report "right is resolved.  Still with pain and decreased range of motion with some weakness in the left shoulder.  Currently using Tylenol, nitroglycerin patch, home exercises.  Has had 2 shoulder injections in the left shoulder previously.  Possible small partial supraspinatus tear seen on ultrasound.  We will continue with conservative management for now. -Home exercises -Increase nitroglycerin patch to half patch -Continue Tylenol -Follow-up in 6 to 8 weeks

## 2020-12-13 NOTE — Patient Instructions (Signed)
Consider increasing the patch to 1/2 patch to left shoulder, change daily. Continue your home exercises. I wouldn't recommend repeating the injection. Can consider revisiting physical therapy. Consider MRI of the shoulder if you don't improve - this would be done in preparation for surgery. Follow up with me in about 2 months.

## 2020-12-13 NOTE — Progress Notes (Signed)
    SUBJECTIVE:   CHIEF COMPLAINT / HPI:   Rotator cuff impingement: Patient here for follow-up of his bilateral shoulder pain. At his last visit he received subacromial steroid injections bilaterally. Since this time he has been doing his home exercises, mostly shoulder abduction using the bands. He is also taking Tylenol arthritis for the pain. He states his right shoulder is much better, but still having pain in his left shoulder and arm. Denies weakness, but has impaired range of motion and uses his right arm to reach for things overhead.  PERTINENT  PMH / PSH: Rotator cuff tendinopathy  OBJECTIVE:   BP 108/70   Ht 6\' 3"  (1.905 m)   Wt 200 lb (90.7 kg)   BMI 25.00 kg/m   General: Alert, oriented. No acute distress MSK: Shoulders: No swelling, bruising. Minimally decreased range of motion with overhead abduction of the left shoulder.  Small, approximately 20 degree decreased external rotation of the left shoulder.  Nontender palpation in the shoulder and scapula bilaterally. Empty can test positive for pain and very mild weakness on the left. Hawkins test positive for pain on the left shoulder, belly press on the left shoulder also positive for pain down the anterior part of the shoulder and humerus.  Complete MSK u/s left shoulder: Biceps tendon: trace tenosynovitis with mild hypoechoic change within tendon Pec major tendon: intact without abnormalities Subscapularis: intact without abnormalities AC joint: mild arthropathy with minimal effusion Infraspinatus: intact without abnormalities Supraspinatus: small intrasubstance tear anterior aspect.  No full thickness tears.  No bursitis.  Impression: Small intrasubstance supraspinatus tear.  Trace biceps tenosynovitis.  Mild AC arthropathy.   ASSESSMENT/PLAN:   Bilateral rotator cuff dysfunction Pain in right is resolved.  Still with pain and decreased range of motion with some weakness in the left shoulder.  Currently using  Tylenol, nitroglycerin patch, home exercises.  Has had 2 subacromial injections in the left shoulder previously.  Small partial supraspinatus tear seen on ultrasound.  We will continue with conservative management for now. -Home exercises -Increase nitroglycerin patch to half patch -Continue Tylenol -Follow-up in 6 to 8 weeks     Benay Pike, MD [place]

## 2020-12-22 ENCOUNTER — Other Ambulatory Visit: Payer: Self-pay | Admitting: Family Medicine

## 2021-02-14 ENCOUNTER — Ambulatory Visit: Payer: 59 | Admitting: Family Medicine

## 2021-02-16 ENCOUNTER — Other Ambulatory Visit: Payer: Self-pay

## 2021-02-16 ENCOUNTER — Ambulatory Visit: Payer: 59 | Admitting: Family Medicine

## 2021-02-16 VITALS — BP 128/78 | Ht 75.0 in | Wt 200.0 lb

## 2021-02-16 DIAGNOSIS — M25511 Pain in right shoulder: Secondary | ICD-10-CM

## 2021-02-16 DIAGNOSIS — M25512 Pain in left shoulder: Secondary | ICD-10-CM

## 2021-02-16 DIAGNOSIS — G8929 Other chronic pain: Secondary | ICD-10-CM | POA: Diagnosis not present

## 2021-02-16 MED ORDER — NITROGLYCERIN 0.2 MG/HR TD PT24: MEDICATED_PATCH | TRANSDERMAL | 2 refills | Status: DC

## 2021-02-16 NOTE — Patient Instructions (Signed)
Continue with the nitro patches, home exercises. Call me if you worsen as at that point I'd recommend an MRI of the worse shoulder with likely orthopedic referral to discuss arthroscopy. You're getting a trigger thumb. Tape or use band-aid trick to help prevent too much flexion of the thumb which leads to more scar tissue formation.

## 2021-02-17 ENCOUNTER — Encounter: Payer: Self-pay | Admitting: Family Medicine

## 2021-02-17 NOTE — Progress Notes (Signed)
PCP: Colon Branch, MD  Subjective:   HPI: Patient is a 70 y.o. male here for bilateral shoulder, right knee pain.  8/16: Christopher Santos states that his right shoulder has been hurting him for several years and he has been seen at this clinic before for it. States the pain mostly resolved after the 2018 visit but has reoccurred in the past couple months.  In the last 1 month, patient has new left shoulder pain that is identical to his right shoulder pain.  He denies any recent injuries, trauma, falls that may have led to this.  He notes that the pain is worse in the anterior aspect of the shoulder with some radiation down his arm stopping halfway before reaching the elbow.  He has difficulty raising his left arm all the way due to pain.  At night, occasionally the pain will wake him up if he makes a sudden movement and is has caused significant deterioration in his sleep quality.  Has been using Advil with some mild relief of pain.  Denies any repetitive motions as he remembers that led to his previous rotator cuff impingement in 2018.  Christopher Santos also endorses bilateral knee hip pain that began shortly after switching his statin to Lipitor. He has discussed this with his GP who recommended cessation of Lipitor for 3 weeks to see if pain resolves.  9/27: Patient reports his shoulders have improved following injections, physical therapy, home exercises. No night pain. Gets intermittent pain more in left shoulder lateral upper arm at times. Can bother with overhead motions. Also with lateral right knee pain that has worsened some since last visit. No new injuries.  11/8: Patient reports while his knee has improved some, his shoulders have not and may be worse than last visit. Left shoulder worse than right. Worse lying on this and is waking him at night. Done physical therapy and still doing home exercises. Pain is lateral in both shoulders. No numbness or tingling. No radiation past elbows. Worse with  overhead motions.  12/20: Rotator cuff impingement: Patient here for follow-up of his bilateral shoulder pain. At his last visit he received subacromial steroid injections bilaterally. Since this time he has been doing his home exercises, mostly shoulder abduction using the bands. He is also taking Tylenol arthritis for the pain. He states his right shoulder is much better, but still having pain in his left shoulder and arm. Denies weakness, but has impaired range of motion and uses his right arm to reach for things overhead.  02/16/21: Patient reports he's had mild improvement since last visit. No new injuries. Still with some night pain. Using 1/2 nitro patch each shoulder and changing daily. Doing home exercises and working out outside of this also.  Past Medical History:  Diagnosis Date  . Allergy   . Blood transfusion without reported diagnosis    01-26-2020  . COVID-19 virus infection 01/26/2020  . Hyperlipemia   . Hypertension    on occasion but not treated     Current Outpatient Medications on File Prior to Visit  Medication Sig Dispense Refill  . Ascorbic Acid (VITAMIN C) 1000 MG tablet Take 1,000 mg by mouth daily.    Marland Kitchen aspirin 81 MG tablet Take 81 mg by mouth at bedtime.     . pravastatin (PRAVACHOL) 40 MG tablet Take 1 tablet (40 mg total) by mouth daily. 90 tablet 1   No current facility-administered medications on file prior to visit.    Past Surgical History:  Procedure  Laterality Date  . COLONOSCOPY    . POLYPECTOMY    . TONSILLECTOMY     at age 74    No Known Allergies  Social History   Socioeconomic History  . Marital status: Married    Spouse name: Not on file  . Number of children: 0  . Years of education: Not on file  . Highest education level: Not on file  Occupational History  . Occupation: IT , [employer] (used to be [employer])  Tobacco Use  . Smoking status: Former Smoker    Quit date: 1993    Years since quitting: 29.1  . Smokeless tobacco: Never  Used  . Tobacco comment: quit 1993, no heavy use   Vaping Use  . Vaping Use: Never used  Substance and Sexual Activity  . Alcohol use: Yes    Alcohol/week: 3.0 standard drinks    Types: 3 Cans of beer per week    Comment: wine  . Drug use: No  . Sexual activity: Not on file  Other Topics Concern  . Not on file  Social History Narrative   Married, no children, original from [place], works for [employer] (doing the same he did at [employer])    Social Determinants of Radio broadcast assistant Strain: Not on [employer] Insecurity: Not on file  Transportation Needs: Not on file  Physical Activity: Not on file  Stress: Not on file  Social Connections: Not on file  Intimate Partner Violence: Not on file    Family History  Problem Relation Age of Onset  . CAD Father 69       MI at 58  . Diabetes Neg Hx   . Colon cancer Neg Hx   . Prostate cancer Neg Hx   . Colon polyps Neg Hx   . Esophageal cancer Neg Hx   . Rectal cancer Neg Hx   . Stomach cancer Neg Hx     BP 128/78   Ht 6\' 3"  (1.905 m)   Wt 200 lb (90.7 kg)   BMI 25.00 kg/m   [facility] 09/20/2020 11/01/2020  Frequency of aerobic exercise (# of days/week) 3 3  Average time in minutes 20 20  Frequency of strengthening activities (# of days/week) 0 0    No flowsheet data found.  Review of Systems: See HPI above.     Objective:  Physical Exam:  Gen: NAD, comfortable in exam room  Right shoulder: No swelling, ecchymoses.  No gross deformity. No TTP. FROM with mild painful arc. Mild positive Hawkins. Negative Yergasons. Strength 5/5 with empty can and resisted internal/external rotation.  Mild pain empty can. NV intact distally.  Left shoulder: No swelling, ecchymoses.  No gross deformity. No TTP. FROM with mild painful arc. Mild positive Hawkins. Negative Yergasons. Strength 5/5 with empty can and resisted internal/external rotation.  Mild pain empty can. NV intact distally.    Assessment & Plan:  1. Bilateral shoulder pain - 2/2 rotator cuff impingement.  S/p subacromial injections, physical therapy, home exercises, using nitro patches now.  Mild improvement since last visit.  Continue nitro.  Consider MRI if not improving.  Also noted at end of visit some catching of right thumb with nodule at level of A1 pulley - shown how to band aid splint this.

## 2021-04-07 ENCOUNTER — Other Ambulatory Visit: Payer: Self-pay | Admitting: Family Medicine

## 2021-05-22 ENCOUNTER — Other Ambulatory Visit: Payer: Self-pay | Admitting: Internal Medicine

## 2021-05-27 ENCOUNTER — Other Ambulatory Visit: Payer: Self-pay

## 2021-05-27 ENCOUNTER — Ambulatory Visit: Payer: 59 | Admitting: Internal Medicine

## 2021-05-27 ENCOUNTER — Encounter: Payer: Self-pay | Admitting: Internal Medicine

## 2021-05-27 VITALS — BP 134/76 | HR 57 | Temp 98.0°F | Resp 16 | Ht 75.0 in | Wt 200.5 lb

## 2021-05-27 DIAGNOSIS — E785 Hyperlipidemia, unspecified: Secondary | ICD-10-CM

## 2021-05-27 NOTE — Patient Instructions (Signed)
   GO TO THE LAB : Get the blood work     [place], [place] Come back for a physical exam by 11-2021

## 2021-05-27 NOTE — Progress Notes (Signed)
   Subjective:    Patient ID: Christopher Santos, male    DOB: [birth year], 70 y.o.   MRN: [id number]  DOS:  05/27/2021 Type of visit - description: Routine visit  Since last visit he is doing well. Good compliance with cluster medication Continue with shoulder pain, decreased range of motion, worse on the right. He remains active.   Review of Systems Denies generalized aches and pains  Past Medical History:  Diagnosis Date  . Allergy   . Blood transfusion without reported diagnosis    01-26-2020  . COVID-19 virus infection 01/26/2020  . Hyperlipemia   . Hypertension    on occasion but not treated     Past Surgical History:  Procedure Laterality Date  . COLONOSCOPY    . POLYPECTOMY    . TONSILLECTOMY     at age 69    Allergies as of 05/27/2021   No Known Allergies     Medication List       Accurate as of May 27, 2021 11:59 PM. If you have any questions, ask your nurse or doctor.        aspirin 81 MG tablet Take 81 mg by mouth at bedtime.   nitroGLYCERIN 0.2 mg/hr patch Commonly known as: NITRODUR - Dosed in mg/24 hr APPLY 1/4TH PATCH TO AFFECTED SHOULDERS, CHANGE DAILY   pravastatin 40 MG tablet Commonly known as: PRAVACHOL Take 1 tablet (40 mg total) by mouth daily.   vitamin C 1000 MG tablet Take 1,000 mg by mouth daily.          Objective:   Physical Exam BP 134/76 (BP Location: Left Arm, Patient Position: Sitting, Cuff Size: Normal)   Pulse (!) 57   Temp 98 F (36.7 C) (Oral)   Resp 16   Ht 6\' 3"  (1.905 m)   Wt 200 lb 8 oz (90.9 kg)   SpO2 98%   BMI 25.06 kg/m  General:   Well developed, NAD, BMI noted. HEENT:  Normocephalic . Face symmetric, atraumatic Lungs:  CTA B Normal respiratory effort, no intercostal retractions, no accessory muscle use. Heart: RRR,  no murmur.  Lower extremities: no pretibial edema bilaterally MSK: Range of motion R shoulder definitely decreased. Skin: Not pale. Not jaundice Neurologic:  alert & oriented X3.   Speech normal, gait appropriate for age and unassisted Psych--  Cognition and judgment appear intact.  Cooperative with normal attention span and concentration.  Behavior appropriate. No anxious or depressed appearing.      Assessment      Assessment  Dyslipidemia L wrist Fx 2015  Fever blisters- valtrex rarely Covid dx ~ 01/2020   PLAN: Dyslipidemia: Based on last FLP, Pravachol dose increased to 40 mg, good compliance, no apparent problems, check a FLP, AST, ALT MSK: Continue with shoulder problems, worse on the R, under the care of sports medicine, denies generalized myalgias or arthralgias. Preventive care: Encouraged to get the COVID-vaccine #4 RTC 11/2021 CPX  This visit occurred during the SARS-CoV-2 public health emergency.  Safety protocols were in place, including screening questions prior to the visit, additional usage of staff PPE, and extensive cleaning of exam room while observing appropriate contact time as indicated for disinfecting solutions.

## 2021-05-28 NOTE — Assessment & Plan Note (Signed)
Dyslipidemia: Based on last FLP, Pravachol dose increased to 40 mg, good compliance, no apparent problems, check a FLP, AST, ALT MSK: Continue with shoulder problems, worse on the R, under the care of sports medicine, denies generalized myalgias or arthralgias. Preventive care: Encouraged to get the COVID-vaccine #4 RTC 11/2021 CPX

## 2021-05-30 LAB — AST: AST: 17 U/L (ref 0–37)

## 2021-05-30 LAB — LIPID PANEL
Cholesterol: 187 mg/dL (ref 0–200)
HDL: 38 mg/dL — ABNORMAL LOW (ref >39.00)
LDL Cholesterol: 118 mg/dL — ABNORMAL HIGH (ref 0–99)
NonHDL: 148.82
Total CHOL/HDL Ratio: 5
Triglycerides: 156 mg/dL — ABNORMAL HIGH (ref 0.0–149.0)
VLDL: 31.2 mg/dL (ref 0.0–40.0)

## 2021-05-30 LAB — ALT: ALT: 12 U/L (ref 0–53)

## 2021-06-02 MED ORDER — PRAVASTATIN SODIUM 80 MG PO TABS: 80.0000 mg | ORAL_TABLET | Freq: Every day | ORAL | 1 refills | Status: DC

## 2021-06-02 NOTE — Addendum Note (Signed)
Addended byDamita Dunnings D on: 06/02/2021 09:46 AM   Modules accepted: Orders

## 2021-06-07 ENCOUNTER — Other Ambulatory Visit: Payer: Self-pay | Admitting: Family Medicine

## 2021-07-01 ENCOUNTER — Ambulatory Visit: Payer: 59 | Attending: Internal Medicine

## 2021-07-01 DIAGNOSIS — Z23 Encounter for immunization: Secondary | ICD-10-CM

## 2021-07-01 NOTE — Progress Notes (Signed)
   Covid-19 Vaccination Clinic  Name:  Christopher Santos    MRN: [id number] DOB: 1951-03-25  07/01/2021  [name] was observed post Covid-19 immunization for 15 minutes without incident. He was provided with Vaccine Information Sheet and instruction to access the V-Safe system.   [name] was instructed to call 911 with any severe reactions post vaccine: Difficulty breathing  Swelling of face and throat  A fast heartbeat  A bad rash all over body  Dizziness and weakness   Immunizations Administered     Name Date Dose VIS Date Route   PFIZER Comrnaty(Gray TOP) Covid-19 Vaccine 07/01/2021 12:35 PM 0.3 mL 12/02/2020 Intramuscular   Manufacturer: [place]   Lot: [id number]   [place]: [phone]

## 2021-07-08 ENCOUNTER — Other Ambulatory Visit (HOSPITAL_BASED_OUTPATIENT_CLINIC_OR_DEPARTMENT_OTHER): Payer: Self-pay

## 2021-07-08 MED ORDER — COVID-19 MRNA VAC-TRIS(PFIZER) 30 MCG/0.3ML IM SUSP
INTRAMUSCULAR | 0 refills | Status: DC
Start: 2021-07-08 — End: 2021-08-10
  Filled 2021-07-08: qty 0.3, 1d supply, fill #0

## 2021-07-19 ENCOUNTER — Other Ambulatory Visit: Payer: Self-pay

## 2021-07-19 ENCOUNTER — Encounter (HOSPITAL_BASED_OUTPATIENT_CLINIC_OR_DEPARTMENT_OTHER): Payer: Self-pay

## 2021-07-19 ENCOUNTER — Encounter: Payer: Self-pay | Admitting: Family Medicine

## 2021-07-19 ENCOUNTER — Ambulatory Visit: Payer: 59 | Admitting: Family Medicine

## 2021-07-19 ENCOUNTER — Ambulatory Visit (HOSPITAL_BASED_OUTPATIENT_CLINIC_OR_DEPARTMENT_OTHER)
Admission: RE | Admit: 2021-07-19 | Discharge: 2021-07-19 | Disposition: A | Discharge status: Home / Self Care | Payer: 59 | Source: Ambulatory Visit | Attending: Family Medicine | Admitting: Family Medicine

## 2021-07-19 VITALS — BP 120/78 | HR 73 | Temp 98.0°F | Resp 18 | Ht 75.0 in | Wt 196.0 lb

## 2021-07-19 DIAGNOSIS — R1032 Left lower quadrant pain: Secondary | ICD-10-CM

## 2021-07-19 LAB — COMPREHENSIVE METABOLIC PANEL
ALT: 14 U/L (ref 0–53)
AST: 19 U/L (ref 0–37)
Albumin: 4.9 g/dL (ref 3.5–5.2)
Alkaline Phosphatase: 73 U/L (ref 39–117)
BUN: 12 mg/dL (ref 6–23)
CO2: 30 mEq/L (ref 19–32)
Calcium: 9.9 mg/dL (ref 8.4–10.5)
Chloride: 101 mEq/L (ref 96–112)
Creatinine, Ser: 0.86 mg/dL (ref 0.40–1.50)
GFR: 89.07 mL/min (ref >60.00)
Glucose, Bld: 95 mg/dL (ref 70–99)
Potassium: 4.2 mEq/L (ref 3.5–5.1)
Sodium: 139 mEq/L (ref 135–145)
Total Bilirubin: 0.8 mg/dL (ref 0.2–1.2)
Total Protein: 7.5 g/dL (ref 6.0–8.3)

## 2021-07-19 LAB — CBC WITH DIFFERENTIAL/PLATELET
Basophils Absolute: 0 10*3/uL (ref 0.0–0.1)
Basophils Relative: 0.8 % (ref 0.0–3.0)
Eosinophils Absolute: 0.1 10*3/uL (ref 0.0–0.7)
Eosinophils Relative: 1.5 % (ref 0.0–5.0)
HCT: 42.7 % (ref 39.0–52.0)
Hemoglobin: 14.5 g/dL (ref 13.0–17.0)
Lymphocytes Relative: 41 % (ref 12.0–46.0)
Lymphs Abs: 2.4 10*3/uL (ref 0.7–4.0)
MCHC: 34.1 g/dL (ref 30.0–36.0)
MCV: 101 fl — ABNORMAL HIGH (ref 78.0–100.0)
Monocytes Absolute: 0.8 10*3/uL (ref 0.1–1.0)
Monocytes Relative: 14 % — ABNORMAL HIGH (ref 3.0–12.0)
Neutro Abs: 2.5 10*3/uL (ref 1.4–7.7)
Neutrophils Relative %: 42.7 % — ABNORMAL LOW (ref 43.0–77.0)
Platelets: 347 10*3/uL (ref 150.0–400.0)
RBC: 4.22 Mil/uL (ref 4.22–5.81)
RDW: 13.4 % (ref 11.5–15.5)
WBC: 5.8 10*3/uL (ref 4.0–10.5)

## 2021-07-19 LAB — POC URINALSYSI DIPSTICK (AUTOMATED)
Bilirubin, UA: NEGATIVE
Blood, UA: NEGATIVE
Glucose, UA: NEGATIVE
Leukocytes, UA: NEGATIVE
Nitrite, UA: NEGATIVE
Protein, UA: NEGATIVE
Spec Grav, UA: 1.025 (ref 1.010–1.025)
Urobilinogen, UA: 0.2 E.U./dL
pH, UA: 6 (ref 5.0–8.0)

## 2021-07-19 LAB — LIPASE: Lipase: 30 U/L (ref 11.0–59.0)

## 2021-07-19 LAB — AMYLASE: Amylase: 46 U/L (ref 27–131)

## 2021-07-19 NOTE — Patient Instructions (Signed)

## 2021-07-19 NOTE — Progress Notes (Addendum)
Subjective:   By signing my name below, I, Shehryar Baig, attest that this documentation has been prepared under the direction and in the presence of Dr. Roma Schanz, DO. 07/19/2021    Patient ID: Christopher Santos, male    DOB: 04/27/1951, 70 y.o.   MRN: [id number]  Chief Complaint  Patient presents with   Abdominal Pain    Pt states pain started this weekend. Pt states pain is left of the belly button. No nausea or vomiting.     HPI Patient is in today for a office visit.   He complains of constant pain on his lower left side rib since Saturday, 07/16/2021, and has worsened since. He denies vomiting and nausea. He notes that when he eats out he gets acute sharp pains in the area. He also notes that the pain comes and goes at this time. He has normal bowel movements. During his last colonoscopy results showed diverticulosis but they have never been infected before. He has decreased appetite since the pain started and notes he continues drinking liquids normally. He is urinating with no issues.  He also reports having mild pain on his left lower back since Saturday, 07/16/2021. He thinks that his abdominal pain is radiating to his back.   Past Medical History:  Diagnosis Date   Allergy    Blood transfusion without reported diagnosis    01-26-2020   COVID-19 virus infection 01/26/2020   Hyperlipemia    Hypertension    on occasion but not treated     Past Surgical History:  Procedure Laterality Date   COLONOSCOPY     POLYPECTOMY     TONSILLECTOMY     at age 20    Family History  Problem Relation Age of Onset   CAD Father 52       MI at 45   Diabetes Neg Hx    Colon cancer Neg Hx    Prostate cancer Neg Hx    Colon polyps Neg Hx    Esophageal cancer Neg Hx    Rectal cancer Neg Hx    Stomach cancer Neg Hx     Social History   Socioeconomic History   Marital status: Married    Spouse name: Not on file   Number of children: 0   Years of education: Not on file    Highest education level: Not on file  Occupational History   Occupation: IT , [employer] (used to be Production designer, theatre/television/film)  Tobacco Use   Smoking status: Former    Types: Cigarettes    Quit date: 1993    Years since quitting: 29.5   Smokeless tobacco: Never   Tobacco comments:    quit 1993, no heavy use   Vaping Use   Vaping Use: Never used  Substance and Sexual Activity   Alcohol use: Yes    Alcohol/week: 3.0 standard drinks    Types: 3 Cans of beer per week    Comment: wine   Drug use: No   Sexual activity: Not on file  Other Topics Concern   Not on file  Social History Narrative   Married, no children, original from [place], works for [employer] (doing the same he did at [employer])    Social Determinants of Radio broadcast assistant Strain: Not on [employer] Insecurity: Not on file  Transportation Needs: Not on file  Physical Activity: Not on file  Stress: Not on file  Social Connections: Not on file  Intimate Partner Violence: Not on  file    Outpatient Medications Prior to Visit  Medication Sig Dispense Refill   Ascorbic Acid (VITAMIN C) 1000 MG tablet Take 1,000 mg by mouth daily.     aspirin 81 MG tablet Take 81 mg by mouth at bedtime.      COVID-19 mRNA Vac-TriS, Pfizer, SUSP injection Inject into the muscle. 0.3 mL 0   nitroGLYCERIN (NITRODUR - DOSED IN MG/24 HR) 0.2 mg/hr patch APPLY 1/4TH PATCH TO AFFECTED SHOULDERS, CHANGE DAILY 30 patch 2   pravastatin (PRAVACHOL) 80 MG tablet Take 1 tablet (80 mg total) by mouth at bedtime. 90 tablet 1   No facility-administered medications prior to visit.    No Known Allergies  Review of Systems  Constitutional:        (+)Decreased appetite  Gastrointestinal:  Positive for abdominal pain (Left lower rib). Negative for nausea and vomiting.  Genitourinary:  Positive for flank pain (Left).      Objective:    Physical Exam Constitutional:      General: He is not in acute distress.    Appearance: Normal appearance. He is not ill-appearing.   HENT:     Head: Normocephalic and atraumatic.     Right Ear: External ear normal.     Left Ear: External ear normal.  Eyes:     Extraocular Movements: Extraocular movements intact.     Pupils: Pupils are equal, round, and reactive to light.  Cardiovascular:     Rate and Rhythm: Normal rate and regular rhythm.     Pulses: Normal pulses.     Heart sounds: Normal heart sounds. No murmur heard.   No gallop.  Pulmonary:     Effort: Pulmonary effort is normal. No respiratory distress.     Breath sounds: Normal breath sounds. No wheezing, rhonchi or rales.  Abdominal:     General: Bowel sounds are normal.     Palpations: Abdomen is soft.     Tenderness: There is abdominal tenderness in the left lower quadrant. There is no guarding or rebound.  Musculoskeletal:     Comments: Left flank tenderness  Skin:    General: Skin is warm and dry.  Neurological:     Mental Status: He is alert and oriented to person, place, and time.  Psychiatric:        Behavior: Behavior normal.    BP 120/78 (BP Location: Right Arm, Patient Position: Sitting, Cuff Size: Normal)   Pulse 73   Temp 98 F (36.7 C) (Oral)   Resp 18   Ht '6\' 3"'$  (1.905 m)   Wt 196 lb (88.9 kg)   SpO2 98%   BMI 24.50 kg/m  Wt Readings from Last 3 Encounters:  07/19/21 196 lb (88.9 kg)  05/27/21 200 lb 8 oz (90.9 kg)  02/16/21 200 lb (90.7 kg)    Diabetic Foot Exam - Simple   No data filed    Lab Results  Component Value Date   WBC 4.4 11/26/2020   HGB 13.7 11/26/2020   HCT 40.4 11/26/2020   PLT 290.0 11/26/2020   GLUCOSE 91 11/26/2020   CHOL 187 05/27/2021   TRIG 156.0 (H) 05/27/2021   HDL 38.00 (L) 05/27/2021   LDLCALC 118 (H) 05/27/2021   ALT 12 05/27/2021   AST 17 05/27/2021   NA 141 11/26/2020   K 4.3 11/26/2020   CL 105 11/26/2020   CREATININE 0.74 11/26/2020   BUN 17 11/26/2020   CO2 30 11/26/2020   TSH 1.66 11/26/2020   PSA 0.76 11/24/2019  INR 1.1 01/26/2020   HGBA1C 5.4 11/24/2019    Lab  Results  Component Value Date   TSH 1.66 11/26/2020   Lab Results  Component Value Date   WBC 4.4 11/26/2020   HGB 13.7 11/26/2020   HCT 40.4 11/26/2020   MCV 97.1 11/26/2020   PLT 290.0 11/26/2020   Lab Results  Component Value Date   NA 141 11/26/2020   K 4.3 11/26/2020   CO2 30 11/26/2020   GLUCOSE 91 11/26/2020   BUN 17 11/26/2020   CREATININE 0.74 11/26/2020   BILITOT 0.5 11/26/2020   ALKPHOS 69 11/26/2020   AST 17 05/27/2021   ALT 12 05/27/2021   PROT 6.7 11/26/2020   ALBUMIN 4.4 11/26/2020   CALCIUM 9.5 11/26/2020   ANIONGAP 15 01/31/2020   GFR 93.63 11/26/2020   Lab Results  Component Value Date   CHOL 187 05/27/2021   Lab Results  Component Value Date   HDL 38.00 (L) 05/27/2021   Lab Results  Component Value Date   LDLCALC 118 (H) 05/27/2021   Lab Results  Component Value Date   TRIG 156.0 (H) 05/27/2021   Lab Results  Component Value Date   CHOLHDL 5 05/27/2021   Lab Results  Component Value Date   HGBA1C 5.4 11/24/2019       Assessment & Plan:   Problem List Items Addressed This Visit       Unprioritized   Left lower quadrant abdominal pain - Primary    ? Diverticulitis Check ct  Check labs  Ct to be done today  If pain worsens-- go to ER       Relevant Orders   CBC with Differential/Platelet   Amylase   Lipase   POCT Urinalysis Dipstick (Automated) (Completed)   Comprehensive metabolic panel   CT Abdomen Pelvis W Contrast   Urine Culture     No orders of the defined types were placed in this encounter.   I, Dr. Roma Schanz, DO, personally preformed the services described in this documentation.  All medical record entries made by the scribe were at my direction and in my presence.  I have reviewed the chart and discharge instructions (if applicable) and agree that the record reflects my personal performance and is accurate and complete. 07/19/2021   I,Shehryar Baig,acting as a Education administrator for [employer],  DO.,have documented all relevant documentation on the behalf of Ann Held, DO,as directed by  Ann Held, DO while in the presence of Ann Held, DO.   Ann Held, DO

## 2021-07-19 NOTE — Assessment & Plan Note (Addendum)
?   Diverticulitis Check ct  Check labs  Ct to be done today  If pain worsens-- go to ER

## 2021-07-20 ENCOUNTER — Ambulatory Visit (HOSPITAL_BASED_OUTPATIENT_CLINIC_OR_DEPARTMENT_OTHER)
Admission: RE | Admit: 2021-07-20 | Discharge: 2021-07-20 | Disposition: A | Discharge status: Home / Self Care | Payer: 59 | Source: Ambulatory Visit | Attending: Family Medicine | Admitting: Family Medicine

## 2021-07-20 ENCOUNTER — Encounter (HOSPITAL_BASED_OUTPATIENT_CLINIC_OR_DEPARTMENT_OTHER): Payer: Self-pay

## 2021-07-20 ENCOUNTER — Other Ambulatory Visit: Payer: Self-pay

## 2021-07-20 DIAGNOSIS — R1032 Left lower quadrant pain: Secondary | ICD-10-CM | POA: Insufficient documentation

## 2021-07-20 DIAGNOSIS — R109 Unspecified abdominal pain: Secondary | ICD-10-CM

## 2021-07-20 LAB — URINE CULTURE
MICRO NUMBER:: 12163697
Result:: NO GROWTH
SPECIMEN QUALITY:: ADEQUATE

## 2021-07-20 MED ORDER — CIPROFLOXACIN HCL 500 MG PO TABS: 500.0000 mg | ORAL_TABLET | Freq: Two times a day (BID) | ORAL | 0 refills | Status: DC

## 2021-07-20 MED ORDER — IOHEXOL 300 MG/ML  SOLN
100.0000 mL | Freq: Once | INTRAMUSCULAR | Status: AC | PRN
  Administered 2021-07-20: 100 mL via INTRAVENOUS

## 2021-07-21 ENCOUNTER — Telehealth: Payer: Self-pay | Admitting: Internal Medicine

## 2021-07-21 ENCOUNTER — Other Ambulatory Visit: Payer: Self-pay

## 2021-07-21 MED ORDER — METRONIDAZOLE 500 MG PO TABS: 500.0000 mg | ORAL_TABLET | Freq: Three times a day (TID) | ORAL | 0 refills | Status: AC

## 2021-07-21 NOTE — Telephone Encounter (Signed)
Pt started ciprofloxacin (CIPRO) 500 MG  on  yesterday. He had 2nd dose today and states he is having an allergic reaction on back and belly. Patient want to know if he should discont meds and be prescribed a new med

## 2021-07-21 NOTE — Telephone Encounter (Signed)
Pt notified. Rx sent. New allergy documented.

## 2021-07-27 ENCOUNTER — Encounter: Payer: Self-pay | Admitting: Family Medicine

## 2021-08-01 ENCOUNTER — Telehealth: Payer: Self-pay | Admitting: Internal Medicine

## 2021-08-01 NOTE — Telephone Encounter (Signed)
Was recently seen with abdominal pain and abnormal CT, will check on him in the morning

## 2021-08-02 NOTE — Telephone Encounter (Signed)
Spoke with the patient. Was seen with left upper quadrant abdominal pain, CT was done, prescribed ciprofloxacin. Multiple findings on the CT, see report. Had a reaction to Cipro, Rx Flagyl. He is feeling about the same. He has an appointment to see GI in few weeks  Please call the patient and set up an appointment with me in the next couple of weeks.

## 2021-08-02 NOTE — Telephone Encounter (Signed)
Appt scheduled 08/10/21 w/ PCP.

## 2021-08-10 ENCOUNTER — Other Ambulatory Visit: Payer: Self-pay

## 2021-08-10 ENCOUNTER — Encounter: Payer: Self-pay | Admitting: Internal Medicine

## 2021-08-10 ENCOUNTER — Ambulatory Visit: Payer: 59 | Admitting: Internal Medicine

## 2021-08-10 VITALS — BP 126/64 | HR 61 | Temp 98.0°F | Resp 16 | Ht 75.0 in | Wt 192.5 lb

## 2021-08-10 DIAGNOSIS — B029 Zoster without complications: Secondary | ICD-10-CM

## 2021-08-10 DIAGNOSIS — R109 Unspecified abdominal pain: Secondary | ICD-10-CM | POA: Diagnosis not present

## 2021-08-10 DIAGNOSIS — R935 Abnormal findings on diagnostic imaging of other abdominal regions, including retroperitoneum: Secondary | ICD-10-CM | POA: Diagnosis not present

## 2021-08-10 NOTE — Patient Instructions (Addendum)
   Please schedule appointment you have with me in December  If her stomach pain increases please let me know.  Also have fever, chills, diarrhea.  I do not believe you are allergic to Cipro, you had shingles.

## 2021-08-10 NOTE — Assessment & Plan Note (Signed)
Left abdominal pain: As described above, seen initially what by my colleague, labs okay, CT showed colitis along with other abnormalities. Subsequently he developed a rash consistent with shingles. Today he is a still slightly TTP at the left upper abdomen. Etiology of the pain: Colitis?  Shingles?. He has been referred to GI, I agree, visit pending.    CT abrmalities.  See report, multiple abnormalities, my plan is to order abdominal MRI when he comes back unless that is done before by GI. Shingles: Rash clearly related to shingles, I do not believe he had an allergy to Cipro (no generalized rash, no itching). RTC 4 months

## 2021-08-10 NOTE — Progress Notes (Signed)
Subjective:    Patient ID: Christopher Santos, male    DOB: 01-13-1951, 70 y.o.   MRN: [id number]  DOS:  08/10/2021 Type of visit - description: f/u   Seen by my colleague 07/19/2021, had  L abd pain since 07/16/2021, work-up reviewed:  CT showed several findings: Mild circumferential wall thickening of the rectosigmoid junction: Mild infection versus inflammatory colitis. Had some abnormalities in the peritoneum. Abnormal liver, MRI recommended. Mild splenomegaly No acute diverticulitis  Labs: CMP, UA, amylase CBC all basically okay  Based on above, he was prescribed Cipro, after 2 doses developed rash, the rash was located at the left flank, see pictures.  Rash was blistery and somewhat painful.  Not itchy.  Because of the rash he was switched to Flagyl.  He finished it already.   The L abdominal pain is about the same, located mostly on the upper abdomen. He denies fever. Never had nausea, vomiting.  No diarrhea.  Appetite is okay.  Perhaps has mild constipation.   Review of Systems See above   Past Medical History:  Diagnosis Date   Allergy    Blood transfusion without reported diagnosis    01-26-2020   COVID-19 virus infection 01/26/2020   Hyperlipemia    Hypertension    on occasion but not treated     Past Surgical History:  Procedure Laterality Date   COLONOSCOPY     POLYPECTOMY     TONSILLECTOMY     at age 76    Allergies as of 08/10/2021   No Known Allergies      Medication List        Accurate as of August 10, 2021 11:59 AM. If you have any questions, ask your nurse or doctor.          STOP taking these medications    Pf[employer]-BioNT COVID-19 Vac-TriS Susp injection Generic drug: COVID-19 mRNA Vac-TriS Therapist, music) Stopped by: Kathlene November, MD       TAKE these medications    aspirin 81 MG tablet Take 81 mg by mouth at bedtime.   nitroGLYCERIN 0.2 mg/hr patch Commonly known as: NITRODUR - Dosed in mg/24 hr APPLY 1/4TH PATCH TO AFFECTED  SHOULDERS, CHANGE DAILY   pravastatin 80 MG tablet Commonly known as: PRAVACHOL Take 1 tablet (80 mg total) by mouth at bedtime.   vitamin C 1000 MG tablet Take 1,000 mg by mouth daily.           Objective:   Physical Exam BP 126/64 (BP Location: Left Arm, Patient Position: Sitting, Cuff Size: Normal)   Pulse 61   Temp 98 F (36.7 C) (Oral)   Resp 16   Ht '6\' 3"'$  (1.905 m)   Wt 192 lb 8 oz (87.3 kg)   SpO2 97%   BMI 24.06 kg/m  General:   Well developed, NAD, BMI noted.  HEENT:  Normocephalic . Face symmetric, atraumatic Lungs:  CTA B Normal respiratory effort, no intercostal retractions, no accessory muscle use. Heart: RRR,  no murmur.  Abdomen:  Not distended, soft, he is a still slightly tender at the upper left abdomen without mass or rebound. Skin: See pictures Lower extremities: no pretibial edema bilaterally  Neurologic:  alert & oriented X3.  Speech normal, gait appropriate for age and unassisted Psych--  Cognition and judgment appear intact.  Cooperative with normal attention span and concentration.  Behavior appropriate. No anxious or depressed appearing.        Assessment     Assessment  Dyslipidemia L  wrist Fx 2015  Fever blisters- valtrex rarely Covid dx ~ 01/2020   PLAN: Left abdominal pain: As described above, seen initially what by my colleague, labs okay, CT showed colitis along with other abnormalities. Subsequently he developed a rash consistent with shingles. Today he is a still slightly TTP at the left upper abdomen. Etiology of the pain: Colitis?  Shingles?. He has been referred to GI, I agree, visit pending.    CT abrmalities.  See report, multiple abnormalities, my plan is to order abdominal MRI when he comes back unless that is done before by GI. Shingles: Rash clearly related to shingles, I do not believe he had an allergy to Cipro (no generalized rash, no itching). RTC 4 months   Time spent 30 minutes, reviewing the chart  explaining why suspect shingles & the abnormal CT.  This visit occurred during the SARS-CoV-2 public health emergency.  Safety protocols were in place, including screening questions prior to the visit, additional usage of staff PPE, and extensive cleaning of exam room while observing appropriate contact time as indicated for disinfecting solutions.

## 2021-09-05 ENCOUNTER — Other Ambulatory Visit (HOSPITAL_BASED_OUTPATIENT_CLINIC_OR_DEPARTMENT_OTHER): Payer: Self-pay

## 2021-09-14 ENCOUNTER — Ambulatory Visit: Payer: 59 | Admitting: Gastroenterology

## 2021-09-14 ENCOUNTER — Encounter: Payer: Self-pay | Admitting: Gastroenterology

## 2021-09-14 ENCOUNTER — Other Ambulatory Visit: Payer: Self-pay

## 2021-09-14 VITALS — BP 115/74 | HR 60 | Temp 97.8°F | Ht 75.0 in | Wt 195.8 lb

## 2021-09-14 DIAGNOSIS — R109 Unspecified abdominal pain: Secondary | ICD-10-CM | POA: Diagnosis not present

## 2021-09-14 NOTE — Progress Notes (Signed)
Gastroenterology Consultation  Referring Provider:     Carollee Herter, Alferd Apa, * Primary Care Physician:  Colon Branch, MD Primary Gastroenterologist:  Dr. Allen Norris     Reason for Consultation:     Left lower quadrant pain and abnormal imaging        HPI:   Christopher Santos is a 70 y.o. y/o male referred for consultation & management of left lower quadrant pain and abnormal imaging by Dr. Larose Kells, Alda Berthold, MD. this patient comes to see me today after being seen by Dr. Henrene Pastor multiple times dating back all the way to 2004.  The patient was last seen for colonoscopy in April of last year and at that time was found to have a hyperplastic polyp but due to the patient's history of polyps was recommended to have a repeat colonoscopy in 5 years.  The patient also was found to have diverticulosis.  The patient had a CT scan done for the abdominal pain that showed:  IMPRESSION: 1. Short segment of mild circumferential wall thickening at the rectosigmoid junction which may represent a mild infectious or inflammatory colitis. 2. Nodular soft tissue density along the peritoneal surface adjacent to the inferior right hepatic lobe posteriorly measuring 18 x 8 x 13 mm. Findings are nonspecific and could represent an area of scarring. However, a peritoneal implant is not excluded. Comparison with prior outside cross-sectional imaging of the abdomen would be helpful to establish stability of this finding. If no prior imaging is available, recommend follow-up abdominal CT in 6-12 months. 3. Indeterminate density 10 mm lesion in the posterior right hepatic lobe. Additional subtle subcentimeter indeterminate lesion in the right hepatic lobe. Further evaluation with nonemergent MRI of the abdomen with and without contrast is recommended. 4. Mild splenomegaly. 5. Colonic diverticulosis without evidence of acute diverticulitis. 6. Mildly enlarged prostate gland. 7. Trace left hydrocele.  The patient reports that his  abdominal pain left side of the abdomen was completely gone a few weeks ago.  He reports that shortly after the pain was noted he broke out with shingles and he thinks it may have been caused by the shingles.  He was also somewhat constipated at that time.  He is no longer having any abdominal pain and the pain from the shingles has completely resolved.  He has no symptoms in the area of the liver where the abnormalities such as a nodular tissue and intermediate density seen in the right lobe were found.  Past Medical History:  Diagnosis Date   Allergy    Blood transfusion without reported diagnosis    01-26-2020   COVID-19 virus infection 01/26/2020   Hyperlipemia    Hypertension    on occasion but not treated     Past Surgical History:  Procedure Laterality Date   COLONOSCOPY     POLYPECTOMY     TONSILLECTOMY     at age 31    Prior to Admission medications   Medication Sig Start Date End Date Taking? Authorizing Provider  Ascorbic Acid (VITAMIN C) 1000 MG tablet Take 1,000 mg by mouth daily.    [provider]  aspirin 81 MG tablet Take 81 mg by mouth at bedtime.     [provider]  nitroGLYCERIN (NITRODUR - DOSED IN MG/24 HR) 0.2 mg/hr patch APPLY 1/4TH PATCH TO AFFECTED SHOULDERS, CHANGE DAILY 04/11/21   Hudnall, Sharyn Lull, MD  pravastatin (PRAVACHOL) 80 MG tablet Take 1 tablet (80 mg total) by mouth at bedtime. 06/02/21  Colon Branch, MD    Family History  Problem Relation Age of Onset   CAD Father 61       MI at 40   Diabetes Neg Hx    Colon cancer Neg Hx    Prostate cancer Neg Hx    Colon polyps Neg Hx    Esophageal cancer Neg Hx    Rectal cancer Neg Hx    Stomach cancer Neg Hx      Social History   Tobacco Use   Smoking status: Former    Types: Cigarettes    Quit date: 1993    Years since quitting: 29.7   Smokeless tobacco: Never   Tobacco comments:    quit 1993, no heavy use   Vaping Use   Vaping Use: Never used  Substance Use Topics    Alcohol use: Yes    Alcohol/week: 3.0 standard drinks    Types: 3 Cans of beer per week    Comment: wine   Drug use: No    Allergies as of 09/14/2021   (No Known Allergies)    Review of Systems:    All systems reviewed and negative except where noted in HPI.   Physical Exam:  There were no vitals taken for this visit. No LMP for male patient. General:   Alert,  Well-developed, well-nourished, pleasant and cooperative in NAD Head:  Normocephalic and atraumatic. Eyes:  Sclera clear, no icterus.   Conjunctiva pink. Ears:  Normal auditory acuity. Neck:  Supple; no masses or thyromegaly. Lungs:  Respirations even and unlabored.  Clear throughout to auscultation.   No wheezes, crackles, or rhonchi. No acute distress. Heart:  Regular rate and rhythm; no murmurs, clicks, rubs, or gallops. Abdomen:  Normal bowel sounds.  No bruits.  Soft, non-tender and non-distended without masses, hepatosplenomegaly or hernias noted.  No guarding or rebound tenderness.  Negative Carnett sign.   Rectal:  Deferred.  Pulses:  Normal pulses noted. Extremities:  No clubbing or edema.  No cyanosis. Neurologic:  Alert and oriented x3;  grossly normal neurologically. Skin:  Intact without significant lesions or rashes.  No jaundice. Lymph Nodes:  No significant cervical adenopathy. Psych:  Alert and cooperative. Normal mood and affect.  Imaging Studies: No results found.  Assessment and Plan:   [name] is a 70 y.o. y/o male who comes in today after being seen for many years by Dr. Henrene Pastor for left side abdominal pain that has completely resolved.  The patient needs no further work-up for the left side abdominal pain but has been told that he should have a repeat CT scan 6 months after his last CT to follow his liver lesions.  He asked if he can have his primary care physician order that test and he was told that it should not be a problem.  I also told him that he also have [place] order it if he would like.   He then said that he was planning to follow-up with Dr. Henrene Pastor in the future for his future colonoscopies.  The patient was explained the plan and agreed with it.  The patient has been in today to contact me if he should have any further problems.    Lucilla Lame, MD. Marval Regal    Note: This dictation was prepared with Dragon dictation along with smaller phrase technology. Any transcriptional errors that result from this process are unintentional.

## 2021-10-27 ENCOUNTER — Other Ambulatory Visit: Payer: Self-pay

## 2021-10-27 NOTE — Telephone Encounter (Signed)
Pt is asking for a refill for nitroglycerin patches. Pharmacy is correct.  Refill sent to pharmacy.

## 2021-11-23 ENCOUNTER — Other Ambulatory Visit: Payer: Self-pay | Admitting: Internal Medicine

## 2021-11-30 ENCOUNTER — Encounter: Payer: Self-pay | Admitting: Internal Medicine

## 2021-11-30 ENCOUNTER — Ambulatory Visit (INDEPENDENT_AMBULATORY_CARE_PROVIDER_SITE_OTHER): Payer: 59 | Admitting: Internal Medicine

## 2021-11-30 VITALS — BP 126/80 | HR 66 | Temp 98.0°F | Resp 16 | Ht 75.0 in | Wt 207.1 lb

## 2021-11-30 DIAGNOSIS — Z0000 Encounter for general adult medical examination without abnormal findings: Secondary | ICD-10-CM | POA: Diagnosis not present

## 2021-11-30 DIAGNOSIS — R7989 Other specified abnormal findings of blood chemistry: Secondary | ICD-10-CM

## 2021-11-30 DIAGNOSIS — Z23 Encounter for immunization: Secondary | ICD-10-CM | POA: Diagnosis not present

## 2021-11-30 DIAGNOSIS — E785 Hyperlipidemia, unspecified: Secondary | ICD-10-CM

## 2021-11-30 DIAGNOSIS — K4090 Unilateral inguinal hernia, without obstruction or gangrene, not specified as recurrent: Secondary | ICD-10-CM

## 2021-11-30 LAB — BASIC METABOLIC PANEL
BUN: 17 mg/dL (ref 6–23)
CO2: 31 mEq/L (ref 19–32)
Calcium: 9.6 mg/dL (ref 8.4–10.5)
Chloride: 104 mEq/L (ref 96–112)
Creatinine, Ser: 0.79 mg/dL (ref 0.40–1.50)
GFR: 91.15 mL/min (ref >60.00)
Glucose, Bld: 95 mg/dL (ref 70–99)
Potassium: 4.5 mEq/L (ref 3.5–5.1)
Sodium: 140 mEq/L (ref 135–145)

## 2021-11-30 LAB — LIPID PANEL
Cholesterol: 161 mg/dL (ref 0–200)
HDL: 39 mg/dL — ABNORMAL LOW (ref >39.00)
LDL Cholesterol: 100 mg/dL — ABNORMAL HIGH (ref 0–99)
NonHDL: 122.49
Total CHOL/HDL Ratio: 4
Triglycerides: 113 mg/dL (ref 0.0–149.0)
VLDL: 22.6 mg/dL (ref 0.0–40.0)

## 2021-11-30 LAB — B12 AND FOLATE PANEL
Folate: 18.9 ng/mL (ref >5.9)
Vitamin B-12: 278 pg/mL (ref 211–911)

## 2021-11-30 LAB — PSA: PSA: 0.6 ng/mL (ref 0.10–4.00)

## 2021-11-30 NOTE — Assessment & Plan Note (Signed)
-  Tdap 11/2021 -  zostavax: 2014 ;  s/p shingrix x 2 -  Prevnar: 2019;  PNM 23: 10-2019 - covid vax  09-2021 thus UTD -  Had a  flu shot   -CCS: colonoscopy in July 2004 -- bx- tubular adenomas and hyperplastic polyps. colonoscopy 01/2008 colon two polyps, tics, hemorrhoids.   Cscope 09-2013, 03/2020, next per GI -Prostate cancer screening: DRE normal, no symptoms, check PSA. -Diet exercise: Discussed. -Labs: BMP, FLP, PSA, A21, folic acid (MCV slightly elevated at last CBC) -POA information provided

## 2021-11-30 NOTE — Assessment & Plan Note (Addendum)
Dyslipidemia: On atorvastatin.  Checking labs Shoulder pain: On NTG patches R hernia: Recommend nonurgent surgical evaluation. Abdominal CT abdomen done 06-2021: Saw GI 09/14/2021, no further GI work-up recommended but needs to repeat CT (or MRI) of the abdomen 6 to 12 months after the original CT.  Plan to contact the patient in a month or 2. RTC 1 year

## 2021-11-30 NOTE — Patient Instructions (Signed)
GO TO THE LAB : Get the blood work     [place] Come back for a physical exam in 1 year    Hernia, Adult   A hernia is the bulging of an organ or tissue through a weak spot in the muscles of the abdomen. Hernias develop most often near the belly button (navel) or the area where the leg meets the lower abdomen (groin). Common types of hernias include: Incisional hernia. This type bulges through a scar from an abdominal surgery. Umbilical hernia. This type develops near the navel. Inguinal hernia. This type develops in the groin or scrotum. Femoral hernia. This type develops below the groin, in the upper thigh area. Hiatal hernia. This type occurs when part of the stomach slides above the muscle that separates the abdomen from the chest (diaphragm). What are the causes? This condition may be caused by: Heavy lifting. Coughing over a long period of time. Straining to have a bowel movement. Constipation can lead to straining. An incision made during abdominal surgery. A physical problem that is present at birth (congenital defect). Being overweight or obese. Smoking. Excess fluid in the abdomen. Undescended testicles in males. What are the signs or symptoms? The main symptom is a skin-colored, rounded bulge in the area of the hernia. However, a bulge may not always be present. It may grow bigger or be more visible when you cough or strain (such as when lifting something heavy). A hernia that can be pushed back into the abdomen (is reducible) rarely causes pain. A hernia that cannot be pushed back into the abdomen (is incarcerated) may lose its blood supply (become strangulated). A hernia that is incarcerated may cause: Pain. Fever. Nausea and vomiting. Swelling. Constipation. How is this diagnosed? A hernia may be diagnosed based on: Your symptoms and medical history. A physical exam. Your health care provider may ask you to cough  or move in certain ways to see if the hernia becomes visible. Imaging tests, such as: X-rays. Ultrasound. CT scan. How is this treated? A hernia that is small and painless may not need to be treated. A hernia that is large or painful may be treated with surgery. Inguinal hernias may be treated with surgery to prevent incarceration or strangulation. Strangulated hernias are always treated with surgery because the strangulation causes a lack of blood supply to the trapped organ or tissue. Surgery to treat a hernia involves pushing the bulge back into place and repairing the weak area of the muscle or abdominal wall. Follow these instructions at home: Activity Avoid straining. Do not lift anything that is heavier than 10 lb (4.5 kg), or the limit that you are told, until your health care provider says that it is safe. When lifting heavy objects, lift with your leg muscles, not your back muscles. Preventing constipation Take actions to prevent constipation. Constipation leads to straining with bowel movements, which can make a hernia worse or cause a hernia repair to break down. Your health care provider may recommend that you take these actions to prevent or treat constipation: Drink enough fluid to keep your urine pale yellow. Take over-the-counter or prescription medicines. Eat foods that are high in fiber, such as beans, whole grains, and fresh fruits and vegetables. Limit foods that are high in fat and processed sugars, such as fried or sweet foods. General instructions When coughing, try to cough gently. You may try to push the hernia back in place by very  gently pressing on it while lying down. Do not try to force the bulge back in if it will not push in easily. If you are overweight, work with your health care provider to lose weight safely. Do not use any products that contain nicotine or tobacco. These products include cigarettes, chewing tobacco, and vaping devices, such as  e-cigarettes. If you need help quitting, ask your health care provider. If you are scheduled for hernia repair, watch your hernia for any changes in shape, size, or color. Tell your health care provider about any changes or new symptoms. Take over-the-counter and prescription medicines only as told by your health care provider. Keep all follow-up visits. This is important. Contact a health care provider if: You develop new pain, swelling, or redness around your hernia. You have signs of constipation, such as: Fewer bowel movements in a week than normal. Difficulty having a bowel movement. Stools that are dry, hard, or larger than normal. Get help right away if: You have a fever or chills. You have abdominal pain that gets worse. You feel nauseous or you vomit. You cannot push the hernia back in place by very gently pressing on it while lying down. Do not try to force the bulge back in if it will not go in easily. The hernia: Changes in shape, size, or color. Feels hard or tender. These symptoms may represent a serious problem that is an emergency. Do not wait to see if the symptoms will go away. Get medical help right away. Call your local emergency services (911 in the [place] Do not drive yourself to the hospital. Summary A hernia is the bulging of an organ or tissue through a weak spot in the muscles of the abdomen. The main symptom is a skin-colored bulge in the hernia area. However, a bulge may not always be present. It may grow bigger or more visible when you cough or strain (such as when having a bowel movement). A hernia that is small and painless may not need to be treated. A hernia that is large or painful may be treated with surgery. Surgery to treat a hernia involves pushing the bulge back into place and repairing the weak part of the abdomen. This information is not intended to replace advice given to you by your health care provider. Make sure you discuss any questions you have  with your health care provider. Document Revised: 07/19/2020 Document Reviewed: 07/19/2020 Elsevier Patient Education  [place].

## 2021-11-30 NOTE — Progress Notes (Signed)
Subjective:    Patient ID: Christopher Santos, male    DOB: [birth year], 70 y.o.   MRN: [id number]  DOS:  11/30/2021 Type of visit - description: cpx  Since the last office visit is doing well. Noted bulging area at the right suprapubic area, on and off for the last 2 months. Is able to "push it in".  No scrotal swelling, no nausea or vomiting. DJD: Continue with shoulder pain on and off.  He still remains very active  Review of Systems  Other than above, a 14 point review of systems is negative     Past Medical History:  Diagnosis Date   Allergy    Blood transfusion without reported diagnosis    01-26-2020   COVID-19 virus infection 01/26/2020   Hyperlipemia    Hypertension    on occasion but not treated     Past Surgical History:  Procedure Laterality Date   COLONOSCOPY     POLYPECTOMY     TONSILLECTOMY     at age 56   Social History   Socioeconomic History   Marital status: Married    Spouse name: Not on file   Number of children: 0   Years of education: Not on file   Highest education level: Not on file  Occupational History   Occupation: IT , [employer] (used to be Production designer, theatre/television/film)  Tobacco Use   Smoking status: Former    Types: Cigarettes    Quit date: 1993    Years since quitting: 29.9   Smokeless tobacco: Never   Tobacco comments:    quit 1993, no heavy use   Vaping Use   Vaping Use: Never used  Substance and Sexual Activity   Alcohol use: Yes    Alcohol/week: 3.0 standard drinks    Types: 3 Cans of beer per week    Comment: wine   Drug use: No   Sexual activity: Not on file  Other Topics Concern   Not on file  Social History Narrative   Married, no children, original from [place], works for [employer] (doing the same he did at [employer])    Social Determinants of Radio broadcast assistant Strain: Not on [employer] Insecurity: Not on file  Transportation Needs: Not on file  Physical Activity: Not on file  Stress: Not on file  Social Connections: Not on file   Intimate Partner Violence: Not on file    Allergies as of 11/30/2021   No Known Allergies      Medication List        Accurate as of November 30, 2021  1:15 PM. If you have any questions, ask your nurse or doctor.          aspirin 81 MG tablet Take 81 mg by mouth at bedtime.   nitroGLYCERIN 0.2 mg/hr patch Commonly known as: NITRODUR - Dosed in mg/24 hr APPLY 1/4TH PATCH TO AFFECTED SHOULDERS, CHANGE DAILY   pravastatin 80 MG tablet Commonly known as: PRAVACHOL TAKE 1 TABLET BY MOUTH EVERYDAY AT BEDTIME   vitamin C 1000 MG tablet Take 1,000 mg by mouth daily.           Objective:   Physical Exam Abdominal:     BP 126/80 (BP Location: Left Arm, Patient Position: Sitting, Cuff Size: Normal)   Pulse 66   Temp 98 F (36.7 C) (Oral)   Resp 16   Ht 6\' 3"  (1.905 m)   Wt 207 lb 2 oz (94 kg)   SpO2  98%   BMI 25.89 kg/m  General: Well developed, NAD, BMI noted Neck: No  thyromegaly  HEENT:  Normocephalic . Face symmetric, atraumatic Lungs:  CTA B Normal respiratory effort, no intercostal retractions, no accessory muscle use. Heart: RRR,  no murmur.  Abdomen:  Not distended, soft, non-tender. No rebound or rigidity.   Lower extremities: no pretibial edema bilaterally DRE: External tags noted, normal sphincter tone, no stools, prostate normal. Skin: Exposed areas without rash. Not pale. Not jaundice Neurologic:  alert & oriented X3.  Speech normal, gait appropriate for age and unassisted Strength symmetric and appropriate for age.  Psych: Cognition and judgment appear intact.  Cooperative with normal attention span and concentration.  Behavior appropriate. No anxious or depressed appearing.     Assessment    Assessment  Dyslipidemia -simvastatin d/c b/c  not effective enough -Lipitor, worked but question of s/w (aches) L wrist Fx 2015  Fever blisters- valtrex rarely Covid dx ~ 01/2020  PLAN: Dyslipidemia: On atorvastatin.  Checking  labs Shoulder pain: On NTG patches R hernia: Recommend nonurgent surgical evaluation. Abdominal CT abdomen done 06-2021: Saw GI 09/14/2021, no further GI work-up recommended but needs to repeat CT (or MRI) of the abdomen 6 to 12 months after the original CT.  Plan to contact the patient in a month or 2. RTC 1 year    This visit occurred during the SARS-CoV-2 public health emergency.  Safety protocols were in place, including screening questions prior to the visit, additional usage of staff PPE, and extensive cleaning of exam room while observing appropriate contact time as indicated for disinfecting solutions.

## 2021-12-05 MED ORDER — EZETIMIBE 10 MG PO TABS: 10.0000 mg | ORAL_TABLET | Freq: Every day | ORAL | 3 refills | Status: DC

## 2021-12-05 NOTE — Addendum Note (Signed)
Addended byDamita Dunnings D on: 12/05/2021 08:10 AM   Modules accepted: Orders

## 2021-12-19 ENCOUNTER — Other Ambulatory Visit: Payer: Self-pay | Admitting: Family Medicine

## 2021-12-20 ENCOUNTER — Other Ambulatory Visit: Payer: Self-pay | Admitting: Surgery

## 2022-01-17 ENCOUNTER — Other Ambulatory Visit: Payer: Self-pay

## 2022-01-17 ENCOUNTER — Encounter (HOSPITAL_BASED_OUTPATIENT_CLINIC_OR_DEPARTMENT_OTHER): Payer: Self-pay | Admitting: Surgery

## 2022-01-23 ENCOUNTER — Encounter (HOSPITAL_BASED_OUTPATIENT_CLINIC_OR_DEPARTMENT_OTHER)
Admission: RE | Admit: 2022-01-23 | Discharge: 2022-01-23 | Disposition: A | Discharge status: Home / Self Care | Payer: 59 | Source: Ambulatory Visit | Attending: Surgery | Admitting: Surgery

## 2022-01-23 DIAGNOSIS — Z87891 Personal history of nicotine dependence: Secondary | ICD-10-CM | POA: Diagnosis not present

## 2022-01-23 DIAGNOSIS — I10 Essential (primary) hypertension: Secondary | ICD-10-CM | POA: Diagnosis not present

## 2022-01-23 DIAGNOSIS — K4090 Unilateral inguinal hernia, without obstruction or gangrene, not specified as recurrent: Secondary | ICD-10-CM | POA: Diagnosis not present

## 2022-01-23 MED ORDER — ENSURE PRE-SURGERY PO LIQD: 296.0000 mL | Freq: Once | ORAL | Status: DC

## 2022-01-23 NOTE — Progress Notes (Signed)

## 2022-01-25 NOTE — H&P (Signed)
REFERRING PHYSICIAN: Colon Branch, MD  PROVIDER: Beverlee Nims, MD  MRN: [id number] DOB: 10-Apr-1951  Subjective   Chief Complaint: New Consultation (Right Ing. Hernia )   History of Present Illness: Christopher Santos is a 71 y.o. male who is seen as an office consultation at the request of Dr. Larose Kells for evaluation of New Consultation (Right Ing. Hernia ) .   This is a pleasant gentleman presents with a right inguinal hernia. He noticed a bulge in the right groin 1 to 2 months ago. He reports that it easily reduces and he has no discomfort. He denies any obstructive symptoms. He is otherwise healthy and without complaints.  Review of Systems: A complete review of systems was obtained from the patient. I have reviewed this information and discussed as appropriate with the patient. See HPI as well for other ROS.  ROS   Medical History: Past Medical History:  Diagnosis Date   Arthritis   Hyperlipidemia   Hypertension   Patient Active Problem List  Diagnosis   Annual physical exam   Follow-up exam   Benign neoplasm of colon   Bilateral rotator cuff dysfunction   COVID-19   Hyperlipidemia   Left lower quadrant abdominal pain   Low back pain radiating to left leg   Unspecified disorder of skin and subcutaneous tissue   History reviewed. No pertinent surgical history.   No Known Allergies  Current Outpatient Medications on File Prior to Visit  Medication Sig Dispense Refill   ezetimibe (ZETIA) 10 mg tablet Take 1 tablet by mouth once daily   nitroGLYcerin (NITRODUR) 0.2 mg/hr patch APPLY 1/4TH PATCH TO AFFECTED SHOULDERS, CHANGE DAILY   pravastatin (PRAVACHOL) 80 MG tablet Take 1 tablet by mouth at bedtime   ascorbic acid, vitamin C, (VITAMIN C) 1000 MG tablet Take by mouth   aspirin 81 MG chewable tablet Take by mouth   No current facility-administered medications on file prior to visit.   History reviewed. No pertinent family history.   Social History    Tobacco Use  Smoking Status Former   Types: Cigarettes   Quit date: 1993   Years since quitting: 30.0  Smokeless Tobacco Never    Social History   Socioeconomic History   Marital status: Married  Tobacco Use   Smoking status: Former  Types: Cigarettes  Quit date: 1993  Years since quitting: 30.0   Smokeless tobacco: Never  Vaping Use   Vaping Use: Unknown  Substance and Sexual Activity   Alcohol use: Yes   Drug use: Never   Objective:   Vitals:   BP: (!) 142/82  Pulse: 69  Temp: 36.2 C (97.1 F)  Weight: 93.8 kg (206 lb 12.8 oz)  Height: 190.5 cm (6\' 3" )   Body mass index is 25.85 kg/m.  Physical Exam   He appears well on exam  His abdomen is soft and nontender  There is an easily reducible moderate sized right inguinal hernia. There is no evidence for left inguinal hernia or umbilical hernia  Lungs clear \ CV: RRR  Assessment and Plan:     Right inguinal hernia    I had a long discussion with the patient regarding abdominal wall anatomy and hernias. We discussed the reasons for hernia repair versus continued expectant management with risk of incarceration. He is interested in surgery. I next discussed both the laparoscopic and open techniques as well as use of mesh. He wishes to proceed with an open right inguinal hernia repair with mesh. We  discussed the risk which includes but is not limited to bleeding, infection, chronic pain, use of mesh, hernia recurrence, postoperative recovery, cardiopulmonary issues, etc. Surgery will be scheduled.

## 2022-01-26 ENCOUNTER — Ambulatory Visit (HOSPITAL_BASED_OUTPATIENT_CLINIC_OR_DEPARTMENT_OTHER): Payer: 59 | Admitting: Anesthesiology

## 2022-01-26 ENCOUNTER — Encounter (HOSPITAL_BASED_OUTPATIENT_CLINIC_OR_DEPARTMENT_OTHER): Payer: Self-pay | Admitting: Surgery

## 2022-01-26 ENCOUNTER — Ambulatory Visit (HOSPITAL_BASED_OUTPATIENT_CLINIC_OR_DEPARTMENT_OTHER)
Admission: RE | Admit: 2022-01-26 | Discharge: 2022-01-26 | Disposition: A | Discharge status: Home / Self Care | Payer: 59 | Attending: Surgery | Admitting: Surgery

## 2022-01-26 ENCOUNTER — Encounter (HOSPITAL_BASED_OUTPATIENT_CLINIC_OR_DEPARTMENT_OTHER)
Admission: RE | Disposition: A | Discharge status: Home / Self Care | Payer: Self-pay | Source: Home / Self Care | Attending: Surgery

## 2022-01-26 ENCOUNTER — Other Ambulatory Visit: Payer: Self-pay

## 2022-01-26 DIAGNOSIS — K4090 Unilateral inguinal hernia, without obstruction or gangrene, not specified as recurrent: Secondary | ICD-10-CM | POA: Insufficient documentation

## 2022-01-26 DIAGNOSIS — Z87891 Personal history of nicotine dependence: Secondary | ICD-10-CM | POA: Insufficient documentation

## 2022-01-26 DIAGNOSIS — I10 Essential (primary) hypertension: Secondary | ICD-10-CM | POA: Insufficient documentation

## 2022-01-26 HISTORY — PX: INGUINAL HERNIA REPAIR: SHX194

## 2022-01-26 SURGERY — REPAIR, HERNIA, INGUINAL, ADULT
Anesthesia: Regional | Site: Groin | Laterality: Right

## 2022-01-26 MED ORDER — ACETAMINOPHEN 500 MG PO TABS
ORAL_TABLET | ORAL | Status: AC
  Filled 2022-01-26: qty 2

## 2022-01-26 MED ORDER — CEFAZOLIN SODIUM-DEXTROSE 2-4 GM/100ML-% IV SOLN
INTRAVENOUS | Status: AC
  Filled 2022-01-26: qty 100

## 2022-01-26 MED ORDER — FENTANYL CITRATE (PF) 100 MCG/2ML IJ SOLN
INTRAMUSCULAR | Status: DC | PRN
Start: 2022-01-26 — End: 2022-01-26
  Administered 2022-01-26: 50 ug via INTRAVENOUS

## 2022-01-26 MED ORDER — OXYCODONE HCL 5 MG PO TABS: 5.0000 mg | ORAL_TABLET | Freq: Once | ORAL | Status: DC | PRN

## 2022-01-26 MED ORDER — PROPOFOL 10 MG/ML IV BOLUS
INTRAVENOUS | Status: DC | PRN
  Administered 2022-01-26: 150 mg via INTRAVENOUS

## 2022-01-26 MED ORDER — ONDANSETRON HCL 4 MG/2ML IJ SOLN: 4.0000 mg | Freq: Once | INTRAMUSCULAR | Status: DC | PRN

## 2022-01-26 MED ORDER — FENTANYL CITRATE (PF) 100 MCG/2ML IJ SOLN
50.0000 ug | Freq: Once | INTRAMUSCULAR | Status: AC
  Administered 2022-01-26: 50 ug via INTRAVENOUS

## 2022-01-26 MED ORDER — MIDAZOLAM HCL 2 MG/2ML IJ SOLN
2.0000 mg | Freq: Once | INTRAMUSCULAR | Status: AC
  Administered 2022-01-26: 2 mg via INTRAVENOUS

## 2022-01-26 MED ORDER — BUPIVACAINE-EPINEPHRINE (PF) 0.5% -1:200000 IJ SOLN
INTRAMUSCULAR | Status: AC
  Filled 2022-01-26: qty 150

## 2022-01-26 MED ORDER — EPHEDRINE SULFATE (PRESSORS) 50 MG/ML IJ SOLN
INTRAMUSCULAR | Status: DC | PRN
Start: 2022-01-26 — End: 2022-01-26
  Administered 2022-01-26: 10 mg via INTRAVENOUS

## 2022-01-26 MED ORDER — FENTANYL CITRATE (PF) 100 MCG/2ML IJ SOLN: 25.0000 ug | INTRAMUSCULAR | Status: DC | PRN

## 2022-01-26 MED ORDER — BUPIVACAINE LIPOSOME 1.3 % IJ SUSP
INTRAMUSCULAR | Status: DC | PRN
  Administered 2022-01-26: 10 mL

## 2022-01-26 MED ORDER — CHLORHEXIDINE GLUCONATE CLOTH 2 % EX PADS: 6.0000 | MEDICATED_PAD | Freq: Once | CUTANEOUS | Status: DC

## 2022-01-26 MED ORDER — BUPIVACAINE-EPINEPHRINE 0.5% -1:200000 IJ SOLN
INTRAMUSCULAR | Status: DC | PRN
  Administered 2022-01-26: 10 mL

## 2022-01-26 MED ORDER — ONDANSETRON HCL 4 MG/2ML IJ SOLN
INTRAMUSCULAR | Status: DC | PRN
  Administered 2022-01-26: 4 mg via INTRAVENOUS

## 2022-01-26 MED ORDER — BUPIVACAINE HCL (PF) 0.5 % IJ SOLN
INTRAMUSCULAR | Status: DC | PRN
  Administered 2022-01-26: 20 mL

## 2022-01-26 MED ORDER — MIDAZOLAM HCL 2 MG/2ML IJ SOLN
INTRAMUSCULAR | Status: AC
  Filled 2022-01-26: qty 2

## 2022-01-26 MED ORDER — DEXAMETHASONE SODIUM PHOSPHATE 4 MG/ML IJ SOLN
INTRAMUSCULAR | Status: DC | PRN
  Administered 2022-01-26: 5 mg via INTRAVENOUS

## 2022-01-26 MED ORDER — ACETAMINOPHEN 500 MG PO TABS
1000.0000 mg | ORAL_TABLET | Freq: Once | ORAL | Status: DC
Start: 2022-01-26 — End: 2022-01-26

## 2022-01-26 MED ORDER — ACETAMINOPHEN 500 MG PO TABS
1000.0000 mg | ORAL_TABLET | ORAL | Status: AC
  Administered 2022-01-26: 1000 mg via ORAL

## 2022-01-26 MED ORDER — OXYCODONE HCL 5 MG/5ML PO SOLN
5.0000 mg | Freq: Once | ORAL | Status: DC | PRN
Start: 2022-01-26 — End: 2022-01-26

## 2022-01-26 MED ORDER — CEFAZOLIN SODIUM-DEXTROSE 2-4 GM/100ML-% IV SOLN
2.0000 g | INTRAVENOUS | Status: AC
  Administered 2022-01-26: 2 g via INTRAVENOUS

## 2022-01-26 MED ORDER — FENTANYL CITRATE (PF) 100 MCG/2ML IJ SOLN
INTRAMUSCULAR | Status: AC
  Filled 2022-01-26: qty 2

## 2022-01-26 MED ORDER — TRAMADOL HCL 50 MG PO TABS
50.0000 mg | ORAL_TABLET | Freq: Four times a day (QID) | ORAL | 0 refills | Status: DC | PRN
Start: 2022-01-26 — End: 2023-12-10

## 2022-01-26 MED ORDER — LIDOCAINE HCL (CARDIAC) PF 100 MG/5ML IV SOSY
PREFILLED_SYRINGE | INTRAVENOUS | Status: DC | PRN
  Administered 2022-01-26: 30 mg via INTRAVENOUS

## 2022-01-26 MED ORDER — LACTATED RINGERS IV SOLN: INTRAVENOUS | Status: DC

## 2022-01-26 MED ORDER — AMISULPRIDE (ANTIEMETIC) 5 MG/2ML IV SOLN: 10.0000 mg | Freq: Once | INTRAVENOUS | Status: DC | PRN

## 2022-01-26 SURGICAL SUPPLY — 43 items
ADH SKN CLS APL DERMABOND .7 (GAUZE/BANDAGES/DRESSINGS) ×1
APL PRP STRL LF DISP 70% ISPRP (MISCELLANEOUS) ×1
BLADE CLIPPER SURG (BLADE) ×2 IMPLANT
BLADE SURG 15 STRL LF DISP TIS (BLADE) ×1 IMPLANT
BLADE SURG 15 STRL SS (BLADE) ×2
CANISTER SUCT 1200ML W/VALVE (MISCELLANEOUS) IMPLANT
CHLORAPREP W/TINT 26 (MISCELLANEOUS) ×2 IMPLANT
COVER BACK TABLE 60X90IN (DRAPES) ×2 IMPLANT
COVER MAYO STAND STRL (DRAPES) ×2 IMPLANT
DECANTER SPIKE VIAL GLASS SM (MISCELLANEOUS) IMPLANT
DERMABOND ADVANCED (GAUZE/BANDAGES/DRESSINGS) ×1
DERMABOND ADVANCED .7 DNX12 (GAUZE/BANDAGES/DRESSINGS) ×1 IMPLANT
DRAIN PENROSE .5X12 LATEX STL (DRAIN) ×2 IMPLANT
DRAPE LAPAROTOMY 100X72 PEDS (DRAPES) ×2 IMPLANT
DRAPE UTILITY XL STRL (DRAPES) ×2 IMPLANT
ELECT REM PT RETURN 9FT ADLT (ELECTROSURGICAL) ×2
ELECTRODE REM PT RTRN 9FT ADLT (ELECTROSURGICAL) ×1 IMPLANT
GLOVE SURG SIGNA 7.5 PF LTX (GLOVE) ×2 IMPLANT
GLOVE SURG UNDER POLY LF SZ7 (GLOVE) ×2 IMPLANT
GOWN STRL REUS W/ TWL LRG LVL3 (GOWN DISPOSABLE) ×1 IMPLANT
GOWN STRL REUS W/ TWL XL LVL3 (GOWN DISPOSABLE) ×1 IMPLANT
GOWN STRL REUS W/TWL LRG LVL3 (GOWN DISPOSABLE) ×4
GOWN STRL REUS W/TWL XL LVL3 (GOWN DISPOSABLE) ×2
MESH PARIETEX PROGRIP RIGHT (Mesh General) ×1 IMPLANT
NDL HYPO 25X1 1.5 SAFETY (NEEDLE) ×1 IMPLANT
NEEDLE HYPO 25X1 1.5 SAFETY (NEEDLE) ×2 IMPLANT
NS IRRIG 1000ML POUR BTL (IV SOLUTION) ×1 IMPLANT
PACK BASIN DAY SURGERY FS (CUSTOM PROCEDURE TRAY) ×2 IMPLANT
PENCIL SMOKE EVACUATOR (MISCELLANEOUS) ×2 IMPLANT
SLEEVE SCD COMPRESS KNEE MED (STOCKING) ×2 IMPLANT
SPONGE INTESTINAL PEANUT (DISPOSABLE) IMPLANT
SPONGE T-LAP 4X18 ~~LOC~~+RFID (SPONGE) ×2 IMPLANT
SUT MNCRL AB 4-0 PS2 18 (SUTURE) ×2 IMPLANT
SUT SILK 2 0 SH (SUTURE) IMPLANT
SUT VIC AB 2-0 CT1 27 (SUTURE) ×4
SUT VIC AB 2-0 CT1 TAPERPNT 27 (SUTURE) ×2 IMPLANT
SUT VIC AB 3-0 CT1 27 (SUTURE) ×2
SUT VIC AB 3-0 CT1 27XBRD (SUTURE) ×1 IMPLANT
SYR BULB EAR ULCER 3OZ GRN STR (SYRINGE) IMPLANT
SYR CONTROL 10ML LL (SYRINGE) ×2 IMPLANT
TOWEL GREEN STERILE FF (TOWEL DISPOSABLE) ×2 IMPLANT
TUBE CONNECTING 20X1/4 (TUBING) IMPLANT
YANKAUER SUCT BULB TIP NO VENT (SUCTIONS) IMPLANT

## 2022-01-26 NOTE — Anesthesia Preprocedure Evaluation (Addendum)
Anesthesia Evaluation  Patient identified by MRN, date of birth, ID band Patient awake    Reviewed: Allergy & Precautions, NPO status , Patient's Chart, lab work & pertinent test results  Airway Mallampati: I  TM Distance: >3 FB Neck ROM: Full    Dental no notable dental hx.    Pulmonary neg pulmonary ROS, former smoker,    Pulmonary exam normal breath sounds clear to auscultation       Cardiovascular hypertension, negative cardio ROS Normal cardiovascular exam Rhythm:Regular Rate:Normal     Neuro/Psych negative neurological ROS  negative psych ROS   GI/Hepatic negative GI ROS, Neg liver ROS,   Endo/Other  negative endocrine ROS  Renal/GU negative Renal ROS  negative genitourinary   Musculoskeletal negative musculoskeletal ROS (+)   Abdominal   Peds negative pediatric ROS (+)  Hematology negative hematology ROS (+)   Anesthesia Other Findings   Reproductive/Obstetrics negative OB ROS                            Anesthesia Physical Anesthesia Plan  ASA: 2  Anesthesia Plan: General and Regional   Post-op Pain Management:    Induction: Intravenous  PONV Risk Score and Plan: 2 and Treatment may vary due to age or medical condition, Midazolam, Ondansetron and Dexamethasone  Airway Management Planned: LMA  Additional Equipment:   Intra-op Plan:   Post-operative Plan: Extubation in OR  Informed Consent: I have reviewed the patients History and Physical, chart, labs and discussed the procedure including the risks, benefits and alternatives for the proposed anesthesia with the patient or authorized representative who has indicated his/her understanding and acceptance.     Dental advisory given  Plan Discussed with: CRNA  Anesthesia Plan Comments: (TAP block. LMA)        Anesthesia Quick Evaluation

## 2022-01-26 NOTE — Progress Notes (Signed)
Assisted Dr. Elgie Congo with right, ultrasound guided, transabdominal plane block. Side rails up, monitors on throughout procedure. See vital signs in flow sheet. Tolerated Procedure well.

## 2022-01-26 NOTE — Discharge Instructions (Addendum)
[facility] _______[facility]  UMBILICAL OR INGUINAL HERNIA REPAIR: POST OP INSTRUCTIONS  Always review your discharge instruction sheet given to you by the facility where your surgery was performed. IF YOU HAVE DISABILITY OR FAMILY LEAVE FORMS, YOU MUST BRING THEM TO THE OFFICE FOR PROCESSING.   DO NOT GIVE THEM TO YOUR DOCTOR.  1. A  prescription for pain medication may be given to you upon discharge.  Take your pain medication as prescribed, if needed.  If narcotic pain medicine is not needed, then you may take acetaminophen (Tylenol) or ibuprofen (Advil) as needed. 2. Take your usually prescribed medications unless otherwise directed. If you need a refill on your pain medication, please contact your pharmacy.  They will contact our office to request authorization. Prescriptions will not be filled after 5 pm or on week-ends. 3. You should follow a light diet the first 24 hours after arrival home, such as soup and crackers, etc.  Be sure to include lots of fluids daily.  Resume your normal diet the day after surgery. 4.Most patients will experience some swelling and bruising around the umbilicus or in the groin and scrotum.  Ice packs and reclining will help.  Swelling and bruising can take several days to resolve.  6. It is common to experience some constipation if taking pain medication after surgery.  Increasing fluid intake and taking a stool softener (such as Colace) will usually help or prevent this problem from occurring.  A mild laxative (Milk of Magnesia or Miralax) should be taken according to package directions if there are no bowel movements after 48 hours. 7. Unless discharge instructions indicate otherwise, you may remove your bandages 24-48 hours after surgery, and you may shower at that time.  You may have steri-strips (small skin tapes) in place directly over the incision.  These strips should be left on the skin for 7-10 days.  If your surgeon used skin glue on the  incision, you may shower in 24 hours.  The glue will flake off over the next 2-3 weeks.  Any sutures or staples will be removed at the office during your follow-up visit. 8. ACTIVITIES:  You may resume regular (light) daily activities beginning the next day--such as daily self-care, walking, climbing stairs--gradually increasing activities as tolerated.  You may have sexual intercourse when it is comfortable.  Refrain from any heavy lifting or straining until approved by your doctor.  a.You may drive when you are no longer taking prescription pain medication, you can comfortably wear a seatbelt, and you can safely maneuver your car and apply brakes. b.RETURN TO WORK:   _____________________________________________  9.You should see your doctor in the office for a follow-up appointment approximately 2-3 weeks after your surgery.  Make sure that you call for this appointment within a day or two after you arrive home to insure a convenient appointment time. 10.OTHER INSTRUCTIONS: __OK TO SHOWER STARTING TOMORROW ICE PACK, TYLENOL, AND IBUPROFEN ALSO FOR PAIN NO LIFTING MORE THAN 15 TO 20 POUNDS FOR 4 WEEKS NO SOAKING IN A TUB FOR ONE WEEK_______________________    _____________________________________  WHEN TO CALL YOUR DOCTOR: Fever over 101.0 Inability to urinate Nausea and/or vomiting Extreme swelling or bruising Continued bleeding from incision. Increased pain, redness, or drainage from the incision  The clinic staff is available to answer your questions during regular business hours.  Please dont hesitate to call and ask to speak to one of the nurses for clinical concerns.  If you have a medical emergency,  go to the nearest emergency room or call 911.  A surgeon from [facility] is always on call at the hospital   [address] [place], [place], [place]  [address] ?  [address], [place], [place]   [address] [phone] ? [phone] ? FAX [phone] Web  site: [email or web address]   Next dose of Tylenol after 12:39pm as needed for pain.   Post Anesthesia Home Care Instructions  Activity: Get plenty of rest for the remainder of the day. A responsible individual must stay with you for 24 hours following the procedure.  For the next 24 hours, DO NOT: -Drive a car -Paediatric nurse -Drink alcoholic beverages -Take any medication unless instructed by your physician -Make any legal decisions or sign important papers.  Meals: Start with liquid foods such as gelatin or soup. Progress to regular foods as tolerated. Avoid greasy, spicy, heavy foods. If nausea and/or vomiting occur, drink only clear liquids until the nausea and/or vomiting subsides. Call your physician if vomiting continues.  Special Instructions/Symptoms: Your throat may feel dry or sore from the anesthesia or the breathing tube placed in your throat during surgery. If this causes discomfort, gargle with warm salt water. The discomfort should disappear within 24 hours.  If you had a scopolamine patch placed behind your ear for the management of post- operative nausea and/or vomiting:  1. The medication in the patch is effective for 72 hours, after which it should be removed.  Wrap patch in a tissue and discard in the trash. Wash hands thoroughly with soap and water. 2. You may remove the patch earlier than 72 hours if you experience unpleasant side effects which may include dry mouth, dizziness or visual disturbances. 3. Avoid touching the patch. Wash your hands with soap and water after contact with the patch.   Information for Discharge Teaching: EXPAREL (bupivacaine liposome injectable suspension)   Your surgeon or anesthesiologist gave you EXPAREL(bupivacaine) to help control your pain after surgery.  EXPAREL is a local anesthetic that provides pain relief by numbing the tissue around the surgical site. EXPAREL is designed to release pain medication over time  and can control pain for up to 72 hours. Depending on how you respond to EXPAREL, you may require less pain medication during your recovery.  Possible side effects: Temporary loss of sensation or ability to move in the area where bupivacaine was injected. Nausea, vomiting, constipation Rarely, numbness and tingling in your mouth or lips, lightheadedness, or anxiety may occur. Call your doctor right away if you think you may be experiencing any of these sensations, or if you have other questions regarding possible side effects.  Follow all other discharge instructions given to you by your surgeon or nurse. Eat a healthy diet and drink plenty of water or other fluids.  If you return to the hospital for any reason within 96 hours following the administration of EXPAREL, it is important for health care providers to know that you have received this anesthetic. A teal colored band has been placed on your arm with the date, time and amount of EXPAREL you have received in order to alert and inform your health care providers. Please leave this armband in place for the full 96 hours following administration, and then you may remove the band.

## 2022-01-26 NOTE — Transfer of Care (Signed)
Immediate Anesthesia Transfer of Care Note  Patient: Christopher Santos  Procedure(s) Performed: OPEN RIGHT INGUINAL HERNIA REPAIR WITH MESH (Right: Groin)  Patient Location: PACU  Anesthesia Type:GA combined with regional for post-op pain  Level of Consciousness: sedated  Airway & Oxygen Therapy: Patient Spontanous Breathing and Patient connected to face mask oxygen  Post-op Assessment: Report given to RN and Post -op Vital signs reviewed and stable  Post vital signs: Reviewed and stable  Last Vitals:  Vitals Value Taken Time  BP    Temp    Pulse 58 01/26/22 0806  Resp    SpO2 98 % 01/26/22 0806  Vitals shown include unvalidated device data.  Last Pain:  Vitals:   01/26/22 0634  TempSrc: Oral  PainSc: 0-No pain         Complications: No notable events documented.

## 2022-01-26 NOTE — Anesthesia Procedure Notes (Signed)
Anesthesia Regional Block: TAP block   Pre-Anesthetic Checklist: , timeout performed,  Correct Patient, Correct Site, Correct Laterality,  Correct Procedure, Correct Position, site marked,  Risks and benefits discussed,  Surgical consent,  Pre-op evaluation,  At surgeon's request and post-op pain management  Laterality: Right  Prep: chloraprep       Needles:  Injection technique: Single-shot  Needle Type: Echogenic Stimulator Needle     Needle Length: 10cm  Needle Gauge: 20     Additional Needles:   Procedures:,,,, ultrasound used (permanent image in chart),,    Narrative:  Start time: 01/26/2022 6:45 AM End time: 01/26/2022 6:50 AM Injection made incrementally with aspirations every 5 mL.  Performed by: Personally  Anesthesiologist: Merlinda Frederick, MD  Additional Notes: Functioning IV was confirmed and monitors were applied.  Sterile prep and drape,hand hygiene and sterile gloves were used. Ultrasound guidance: relevant anatomy identified, needle position confirmed, local anesthetic spread visualized around nerve(s)., vascular puncture avoided. Negative aspiration and negative test dose prior to incremental administration of local anesthetic. The patient tolerated the procedure well.

## 2022-01-26 NOTE — Anesthesia Procedure Notes (Signed)
Procedure Name: LMA Insertion Date/Time: 01/26/2022 7:27 AM Performed by: Willa Frater, CRNA Pre-anesthesia Checklist: Patient identified, Emergency Drugs available, Suction available and Patient being monitored Patient Re-evaluated:Patient Re-evaluated prior to induction Oxygen Delivery Method: Circle system utilized Preoxygenation: Pre-oxygenation with 100% oxygen Induction Type: IV induction Ventilation: Mask ventilation without difficulty LMA: LMA inserted LMA Size: 5.0 Number of attempts: 1 Airway Equipment and Method: Bite block Placement Confirmation: positive ETCO2 Tube secured with: Tape Dental Injury: Teeth and Oropharynx as per pre-operative assessment

## 2022-01-26 NOTE — Interval H&P Note (Signed)
History and Physical Interval Note: no change in H and P  01/26/2022 7:09 AM  Christopher Santos  has presented today for surgery, with the diagnosis of RIGHT INGUINAL HERNIA.  The various methods of treatment have been discussed with the patient and family. After consideration of risks, benefits and other options for treatment, the patient has consented to  Procedure(s): OPEN RIGHT INGUINAL HERNIA REPAIR WITH MESH (Right) as a surgical intervention.  The patient's history has been reviewed, patient examined, no change in status, stable for surgery.  I have reviewed the patient's chart and labs.  Questions were answered to the patient's satisfaction.     Coralie Keens

## 2022-01-26 NOTE — Anesthesia Postprocedure Evaluation (Signed)
Anesthesia Post Note  Patient: Christopher Santos  Procedure(s) Performed: OPEN RIGHT INGUINAL HERNIA REPAIR WITH MESH (Right: Groin)     Patient location during evaluation: PACU Anesthesia Type: Regional and General Level of consciousness: sedated Pain management: pain level controlled Vital Signs Assessment: post-procedure vital signs reviewed and stable Respiratory status: spontaneous breathing and respiratory function stable Cardiovascular status: stable Postop Assessment: no apparent nausea or vomiting Anesthetic complications: no   No notable events documented.  Last Vitals:  Vitals:   01/26/22 0830 01/26/22 0845  BP: (!) 144/76 133/72  Pulse: 62 70  Resp: 15 16  Temp:  (!) 36.2 C  SpO2: 97% 95%    Last Pain:  Vitals:   01/26/22 0845  TempSrc:   PainSc: [name]

## 2022-01-26 NOTE — Op Note (Signed)
OPEN RIGHT INGUINAL HERNIA REPAIR WITH MESH  Procedure Note  Christopher Santos 01/26/2022   Pre-op Diagnosis: RIGHT INGUINAL HERNIA     Post-op Diagnosis: same  Procedure(s): OPEN RIGHT INGUINAL HERNIA REPAIR WITH MESH  Surgeon(s): Coralie Keens, MD  Anesthesia: General  Staff:  Circulator: Ted Mcalpine, RN Scrub Person: Lorenza Burton, CST  Estimated Blood Loss: Minimal               \Findings: The patient was found to have a direct right inguinal hernia which was repaired with a piece of large Prolene ProGrip mesh from convenient  Procedure: The patient was brought to the operating room and identifies the correct patient.  He was placed upon on the operating room table and general anesthesia was induced.  He had received a tap block from anesthesia in the preoperative holding area under ultrasound.  His right lower quadrant was prepped and draped in usual sterile fashion.  I anesthetized the skin in the right inguinal area with Marcaine.  I then made a small longitudinal incision with a scalpel.  I carried this down through Scarpa's fascia with electrocautery.  The external bleak fascia was then identified and opened toward the internal and external rings.  I controlled the patient's testicular cord and contents with a Penrose drain.  The patient had a direct hernia defect.  I dissected out the cord structures and found no evidence of indirect hernia.  Next a piece of large Prolene ProGrip mesh from [employer] was brought to the field.  I placed that against the pubic tubercle and then brought around the cord structures.  I sutured it to the pubic tubercle and shelving edge of the inguinal ligament with 2 separate 2-0 Vicryl sutures.  Wide coverage of the internal ring and inguinal floor appeared to be achieved.  I then closed the external oblique fascia over the top of the mesh with a running 2-0 Vicryl suture.  Scarpa's fascia was then closed with interrupted 3-0 Vicryl sutures  and the skin was closed with running 4-0 Monocryl.  Dermabond was then applied.  The patient tolerated the procedure well.  All the counts were correct at the end of the procedure.  The patient was then extubated in the operating room and taken in a stable condition to the recovery room.          Coralie Keens   Date: 01/26/2022  Time: 8:00 AM

## 2022-01-27 ENCOUNTER — Encounter (HOSPITAL_BASED_OUTPATIENT_CLINIC_OR_DEPARTMENT_OTHER): Payer: Self-pay | Admitting: Surgery

## 2022-01-27 NOTE — Progress Notes (Signed)
Left message stating courtesy call and if any questions or concerns please call the doctors office.  

## 2022-02-25 ENCOUNTER — Other Ambulatory Visit: Payer: Self-pay | Admitting: Family Medicine

## 2022-04-03 ENCOUNTER — Telehealth: Payer: Self-pay | Admitting: Internal Medicine

## 2022-04-03 DIAGNOSIS — R109 Unspecified abdominal pain: Secondary | ICD-10-CM

## 2022-04-03 DIAGNOSIS — R935 Abnormal findings on diagnostic imaging of other abdominal regions, including retroperitoneum: Secondary | ICD-10-CM

## 2022-04-03 NOTE — Telephone Encounter (Signed)
Advise patient: ?It is time to repeat CT abdominal pelvis with contrast to follow-up on an abnormal CT abdomen done 06-2021.  That was recommended by GI . ?Please arrange a CT and let the patient know ?

## 2022-04-04 NOTE — Telephone Encounter (Signed)
Order placed.  My chart message sent.

## 2022-04-19 ENCOUNTER — Ambulatory Visit (HOSPITAL_COMMUNITY)
Admission: RE | Admit: 2022-04-19 | Discharge: 2022-04-19 | Disposition: A | Discharge status: Home / Self Care | Payer: 59 | Source: Ambulatory Visit | Attending: Internal Medicine | Admitting: Internal Medicine

## 2022-04-19 DIAGNOSIS — R935 Abnormal findings on diagnostic imaging of other abdominal regions, including retroperitoneum: Secondary | ICD-10-CM | POA: Diagnosis present

## 2022-04-19 DIAGNOSIS — R109 Unspecified abdominal pain: Secondary | ICD-10-CM | POA: Insufficient documentation

## 2022-04-19 LAB — POCT I-STAT CREATININE: Creatinine, Ser: 1.1 mg/dL (ref 0.61–1.24)

## 2022-04-19 MED ORDER — SODIUM CHLORIDE (PF) 0.9 % IJ SOLN
INTRAMUSCULAR | Status: AC
  Filled 2022-04-19: qty 50

## 2022-04-19 MED ORDER — IOHEXOL 300 MG/ML  SOLN
100.0000 mL | Freq: Once | INTRAMUSCULAR | Status: AC | PRN
  Administered 2022-04-19: 100 mL via INTRAVENOUS

## 2022-05-18 ENCOUNTER — Other Ambulatory Visit: Payer: Self-pay | Admitting: Internal Medicine

## 2022-11-13 ENCOUNTER — Other Ambulatory Visit: Payer: Self-pay | Admitting: Internal Medicine

## 2022-11-19 ENCOUNTER — Other Ambulatory Visit: Payer: Self-pay | Admitting: Internal Medicine

## 2022-12-04 ENCOUNTER — Ambulatory Visit: Payer: 59 | Admitting: Internal Medicine

## 2022-12-06 ENCOUNTER — Ambulatory Visit (INDEPENDENT_AMBULATORY_CARE_PROVIDER_SITE_OTHER): Payer: 59 | Admitting: Internal Medicine

## 2022-12-06 ENCOUNTER — Encounter: Payer: Self-pay | Admitting: Internal Medicine

## 2022-12-06 ENCOUNTER — Other Ambulatory Visit (HOSPITAL_BASED_OUTPATIENT_CLINIC_OR_DEPARTMENT_OTHER): Payer: Self-pay

## 2022-12-06 VITALS — BP 130/74 | HR 56 | Temp 98.1°F | Resp 16 | Ht 75.0 in | Wt 201.5 lb

## 2022-12-06 DIAGNOSIS — Z0000 Encounter for general adult medical examination without abnormal findings: Secondary | ICD-10-CM

## 2022-12-06 DIAGNOSIS — E785 Hyperlipidemia, unspecified: Secondary | ICD-10-CM | POA: Diagnosis not present

## 2022-12-06 LAB — CBC WITH DIFFERENTIAL/PLATELET
Basophils Absolute: 0 10*3/uL (ref 0.0–0.1)
Basophils Relative: 0.8 % (ref 0.0–3.0)
Eosinophils Absolute: 0.3 10*3/uL (ref 0.0–0.7)
Eosinophils Relative: 5.4 % — ABNORMAL HIGH (ref 0.0–5.0)
HCT: 41.2 % (ref 39.0–52.0)
Hemoglobin: 14.2 g/dL (ref 13.0–17.0)
Lymphocytes Relative: 48 % — ABNORMAL HIGH (ref 12.0–46.0)
Lymphs Abs: 2.3 10*3/uL (ref 0.7–4.0)
MCHC: 34.6 g/dL (ref 30.0–36.0)
MCV: 102.3 fl — ABNORMAL HIGH (ref 78.0–100.0)
Monocytes Absolute: 0.7 10*3/uL (ref 0.1–1.0)
Monocytes Relative: 13.9 % — ABNORMAL HIGH (ref 3.0–12.0)
Neutro Abs: 1.5 10*3/uL (ref 1.4–7.7)
Neutrophils Relative %: 31.9 % — ABNORMAL LOW (ref 43.0–77.0)
Platelets: 334 10*3/uL (ref 150.0–400.0)
RBC: 4.03 Mil/uL — ABNORMAL LOW (ref 4.22–5.81)
RDW: 13.9 % (ref 11.5–15.5)
WBC: 4.8 10*3/uL (ref 4.0–10.5)

## 2022-12-06 LAB — COMPREHENSIVE METABOLIC PANEL
ALT: 18 U/L (ref 0–53)
AST: 22 U/L (ref 0–37)
Albumin: 4.8 g/dL (ref 3.5–5.2)
Alkaline Phosphatase: 61 U/L (ref 39–117)
BUN: 17 mg/dL (ref 6–23)
CO2: 31 mEq/L (ref 19–32)
Calcium: 9.9 mg/dL (ref 8.4–10.5)
Chloride: 101 mEq/L (ref 96–112)
Creatinine, Ser: 0.89 mg/dL (ref 0.40–1.50)
GFR: 87.3 mL/min (ref >60.00)
Glucose, Bld: 96 mg/dL (ref 70–99)
Potassium: 4.6 mEq/L (ref 3.5–5.1)
Sodium: 139 mEq/L (ref 135–145)
Total Bilirubin: 0.7 mg/dL (ref 0.2–1.2)
Total Protein: 7.2 g/dL (ref 6.0–8.3)

## 2022-12-06 LAB — LIPID PANEL
Cholesterol: 144 mg/dL (ref 0–200)
HDL: 39.7 mg/dL (ref >39.00)
LDL Cholesterol: 82 mg/dL (ref 0–99)
NonHDL: 104.39
Total CHOL/HDL Ratio: 4
Triglycerides: 110 mg/dL (ref 0.0–149.0)
VLDL: 22 mg/dL (ref 0.0–40.0)

## 2022-12-06 LAB — PSA: PSA: 0.66 ng/mL (ref 0.10–4.00)

## 2022-12-06 MED ORDER — COMIRNATY 30 MCG/0.3ML IM SUSY
PREFILLED_SYRINGE | INTRAMUSCULAR | 0 refills | Status: DC
  Filled 2022-12-06: qty 0.3, 1d supply, fill #0

## 2022-12-06 MED ORDER — PRAVASTATIN SODIUM 80 MG PO TABS: 80.0000 mg | ORAL_TABLET | Freq: Every day | ORAL | 3 refills | Status: DC

## 2022-12-06 MED ORDER — EZETIMIBE 10 MG PO TABS: 10.0000 mg | ORAL_TABLET | Freq: Every day | ORAL | 3 refills | Status: DC

## 2022-12-06 NOTE — Progress Notes (Signed)
Subjective:    Patient ID: Christopher Santos, male    DOB: 1951-04-18, 71 y.o.   MRN: [id number]  DOS:  12/06/2022 Type of visit - description: cpx  Since the last office visit he is doing well.  Has no concerns or symptoms  Review of Systems   A 14 point review of systems is negative    Past Medical History:  Diagnosis Date   Allergy    Blood transfusion without reported diagnosis    01-26-2020   COVID-19 virus infection 01/26/2020   Hyperlipemia    Hypertension    on occasion but not treated     Past Surgical History:  Procedure Laterality Date   COLONOSCOPY     INGUINAL HERNIA REPAIR Right 01/26/2022   Procedure: OPEN RIGHT INGUINAL HERNIA REPAIR WITH MESH;  Surgeon: Abigail Miyamoto, MD;  Location: [facility];  Service: General;  Laterality: Right;   POLYPECTOMY     TONSILLECTOMY     at age 47   Social History   Socioeconomic History   Marital status: Married    Spouse name: Not on file   Number of children: 0   Years of education: Not on file   Highest education level: Not on file  Occupational History   Occupation: IT , [employer] (used to be Advertising account planner)    Comment: to retire ~ 03-2023  Tobacco Use   Smoking status: Former    Types: Cigarettes    Quit date: 1993    Years since quitting: 30.9   Smokeless tobacco: Never   Tobacco comments:    quit 1993, no heavy use   Vaping Use   Vaping Use: Never used  Substance and Sexual Activity   Alcohol use: Yes    Alcohol/week: 3.0 standard drinks of alcohol    Types: 3 Cans of beer per week    Comment: wine   Drug use: No   Sexual activity: Not on file  Other Topics Concern   Not on file  Social History Narrative   Married, no children, original from [place], works for [employer] (doing the same he did at [employer])    Social Determinants of Corporate investment banker Strain: Not on [employer] Insecurity: Not on file  Transportation Needs: Not on file  Physical Activity: Not on file  Stress: Not on file   Social Connections: Not on file  Intimate Partner Violence: Not on file    Current Outpatient Medications  Medication Instructions   aspirin 81 mg, Oral, Daily at bedtime   COVID-19 mRNA vaccine 2023-2024 (COMIRNATY) syringe Intramuscular   ezetimibe (ZETIA) 10 mg, Oral, Daily   pravastatin (PRAVACHOL) 80 mg, Oral, Daily at bedtime   traMADol (ULTRAM) 50 mg, Oral, Every 6 hours PRN   vitamin C 1,000 mg, Oral, Daily       Objective:   Physical Exam BP 130/74   Pulse (!) 56   Temp 98.1 F (36.7 C) (Oral)   Resp 16   Ht 6\' 3"  (1.905 m)   Wt 201 lb 8 oz (91.4 kg)   SpO2 97%   BMI 25.19 kg/m  General: Well developed, NAD, BMI noted Neck: No  thyromegaly  HEENT:  Normocephalic . Face symmetric, atraumatic Lungs:  CTA B Normal respiratory effort, no intercostal retractions, no accessory muscle use. Heart: RRR,  no murmur.  Abdomen:  Not distended, soft, non-tender. No rebound or rigidity.   Lower extremities: no pretibial edema bilaterally  Skin: Exposed areas without rash.  Not pale. Not jaundice Neurologic:  alert & oriented X3.  Speech normal, gait appropriate for age and unassisted Strength symmetric and appropriate for age.  Psych: Cognition and judgment appear intact.  Cooperative with normal attention span and concentration.  Behavior appropriate. No anxious or depressed appearing.     Assessment     Assessment  Dyslipidemia -simvastatin d/c b/c  not effective enough -Lipitor, worked but question of s/w (aches) L wrist Fx 2015  Fever blisters- valtrex rarely Covid dx ~ 01/2020  PLAN: Here for CPX Dyslipidemia: Last LDL 100, Zetia was added to Pravachol to get the LDL lower given FH CAD Aspirin: Due to FH CAD  father at age 86 will continue w/ aspirin RTC 1 year

## 2022-12-06 NOTE — Patient Instructions (Addendum)
Vaccines I recommend:  Covid booster RSV vaccine     GO TO THE LAB : Get the blood work     [place] back for   a physical exam in 1 year     Do you have a "Living will" or "[place] of attorney"? (Advance care planning documents)  If you already have a living will or healthcare power of attorney, is recommended you bring the copy to be scanned in your chart. The document will be available to all the doctors you see in the system.  If you don't have one, please consider create one.  Advance care planning is a process that supports adults in  understanding and sharing their preferences regarding future medical care.   The patient's preferences are recorded in documents called Advance Directives and the can be modified at any time while the patient is in full mental capacity.   The documentation should be available at all times to the patient, the family and the healthcare providers.   This legal documents direct the family or surrogate to make the decision if the patient is not capable to do so.    Advance directives can be documented in many types of formats,  documents have names such as:  Lliving will  Durable power of attorney for healthcare (healthcare proxy or healthcare power of attorney)  Combined directives  Physician orders for life-sustaining treatment    More information at:  [email or web address]

## 2022-12-07 ENCOUNTER — Encounter: Payer: Self-pay | Admitting: Internal Medicine

## 2022-12-07 NOTE — Assessment & Plan Note (Signed)
-  Tdap 11/2021 -  zostavax: 2014 ;  s/p shingrix x 2 -  Prevnar: 2019;  PNM 23: 10-2019 - RSV d/w pt  - covid vax  booster d/w pt  -  Had a  flu shot   -CCS: colonoscopy in July 2004 -- bx- tubular adenomas and hyperplastic polyps. colonoscopy 01/2008 colon two polyps, tics, hemorrhoids.   Cscope 09-2013, 03/2020, next 5 years per GI letter  -Prostate cancer screening: DRE normal 2022,  no sxs, check PSA. -Diet exercise: Discussed. -Labs: CMP FLP CBC PSA -POA information provided

## 2022-12-07 NOTE — Assessment & Plan Note (Signed)
Here for CPX Dyslipidemia: Last LDL 100, Zetia was added to Pravachol to get the LDL lower given FH CAD Aspirin: Due to [place] CAD  father at age 69 will continue w/ aspirin RTC 1 year

## 2023-04-20 IMAGING — CT CT ABD-PELV W/ CM
2 of 5 series · 14 of 46 positions shown, 16 images · IV contrast (Omnipaque)
Comparison: None.

CLINICAL DATA: Diverticulitis suspected.  Left-sided abdominal pain

EXAM:
CT ABDOMEN AND PELVIS WITH CONTRAST
TECHNIQUE: Multidetector CT imaging of the abdomen and pelvis was performed
using the standard protocol following bolus administration of
intravenous contrast.
CONTRAST:  100mL OMNIPAQUE IOHEXOL 300 MG/ML  SOLN

[Series 2: axial st · axial · 0.90mm/px · z∈[+806,+1311]mm · 11 of 113 slices shown, 13 images]
[im 6/113  soft-tissue]
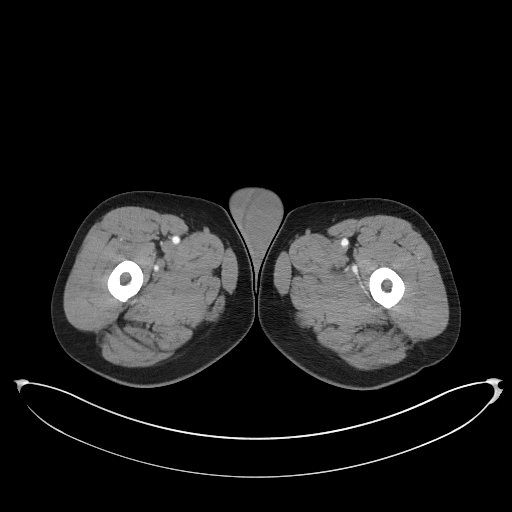
[im 6/113  bone]
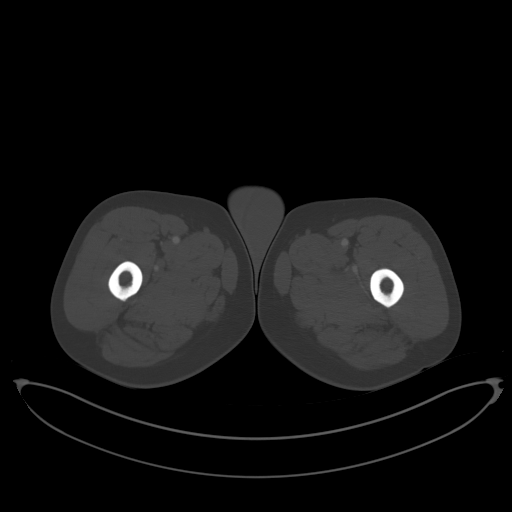
[im 17/113  soft-tissue]
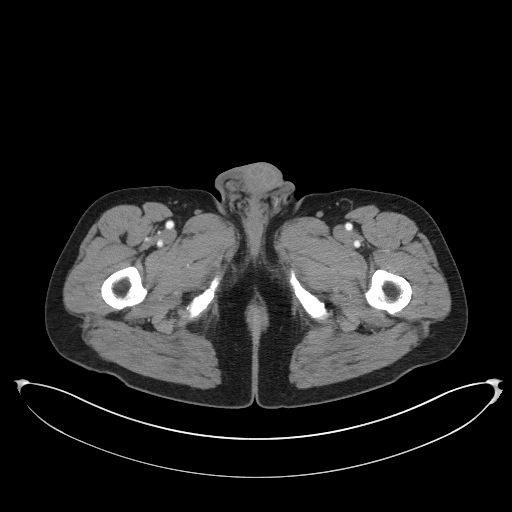
[im 29/113  soft-tissue]
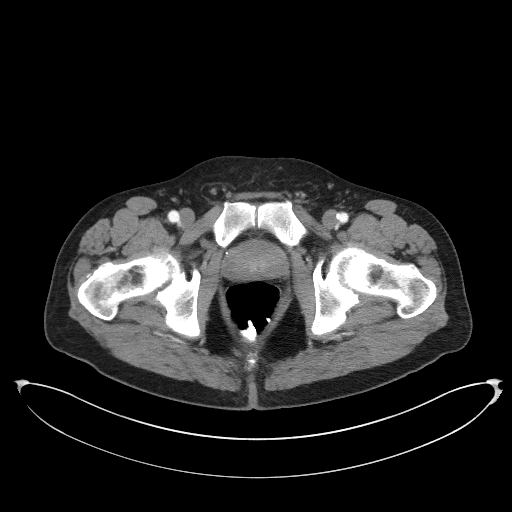
[im 40/113  soft-tissue]
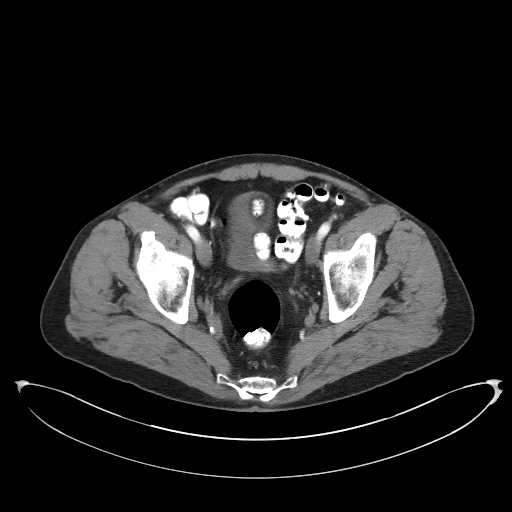
[im 45/113  soft-tissue]
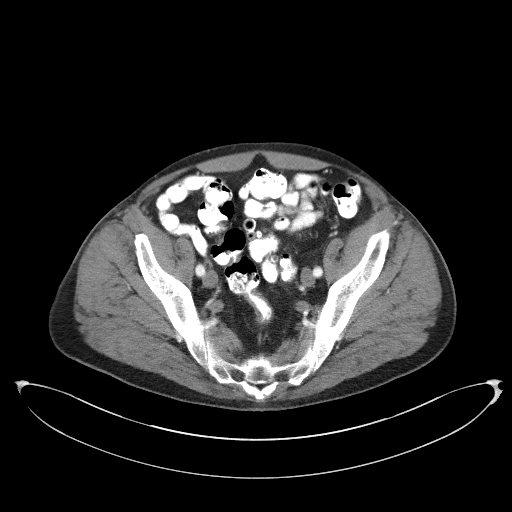
[im 57/113  soft-tissue]
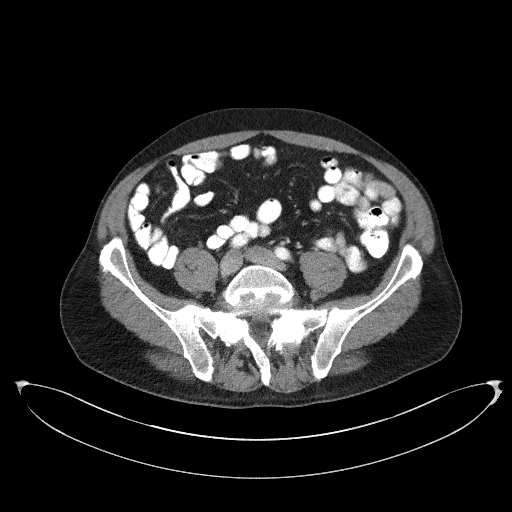
[im 68/113  soft-tissue]
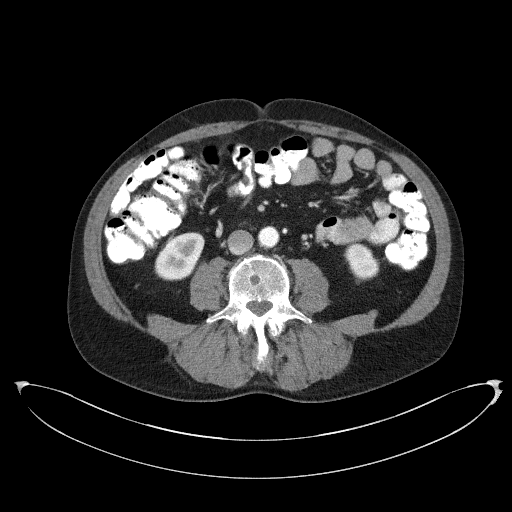
[im 73/113  soft-tissue]
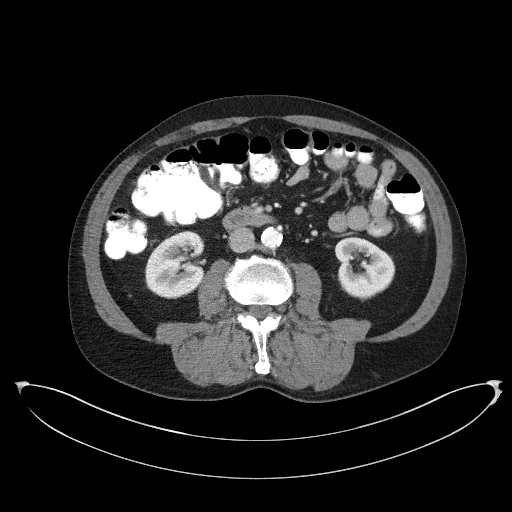
[im 85/113  soft-tissue]
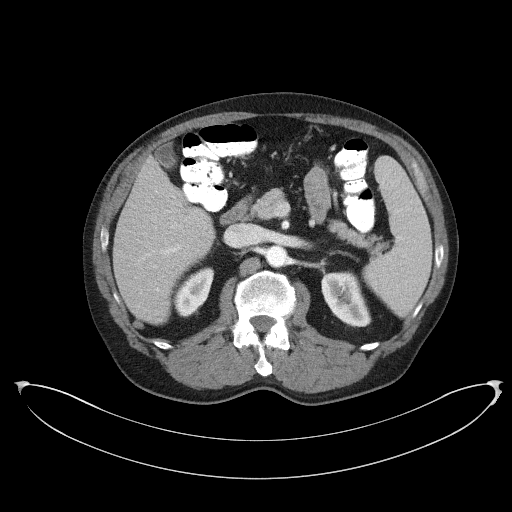
[im 85/113  bone]
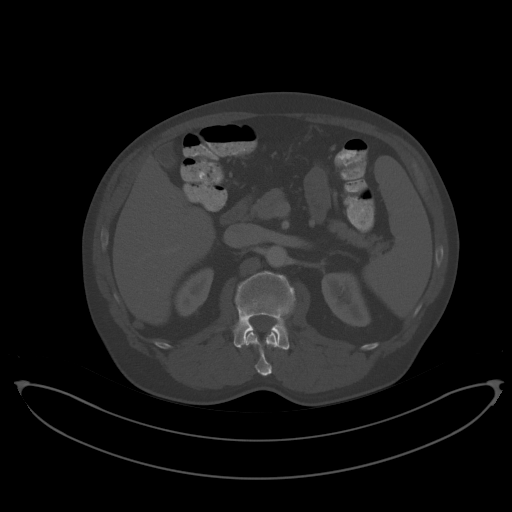
[im 96/113  soft-tissue]
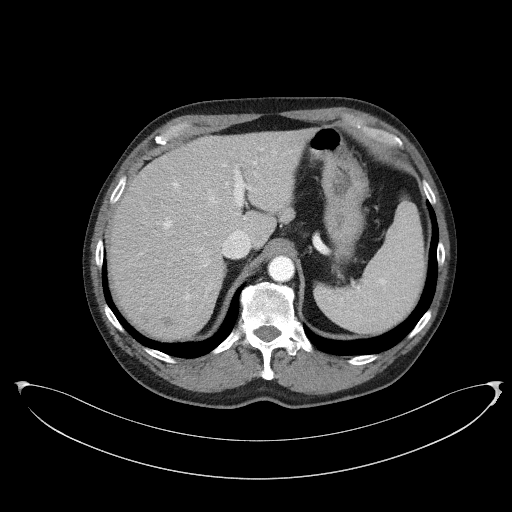
[im 107/113  soft-tissue]
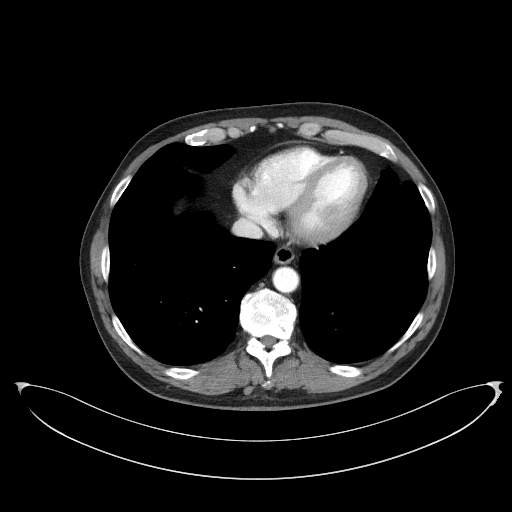

[Series 5: coronal st · coronal · 0.81mm/px · 3 of 96 slices shown]
[im 32/96  soft-tissue]
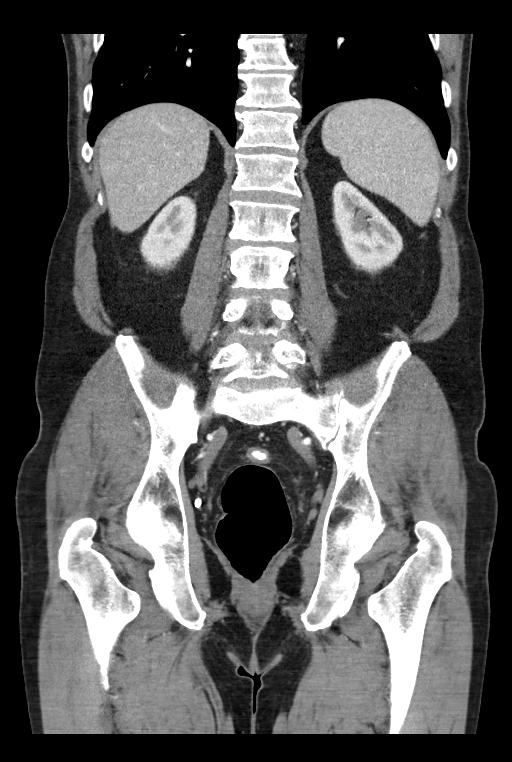
[im 43/96  soft-tissue]
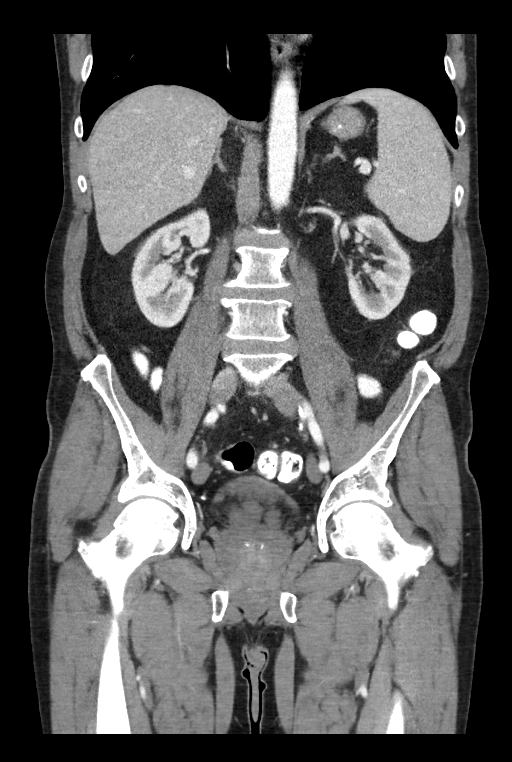
[im 53/96  soft-tissue]
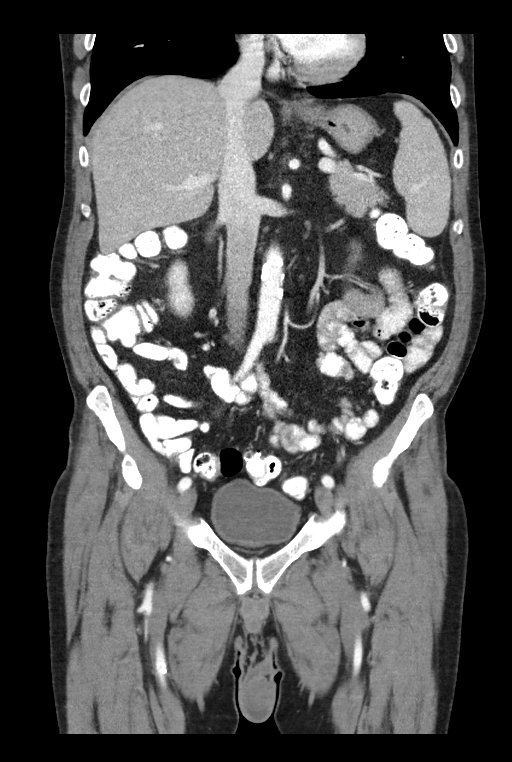

[14 of 46 positions shown; findings below may reference images not displayed]

FINDINGS: Lower chest: Included lung bases are clear.  Heart size is normal.

Hepatobiliary: 1.0 cm indeterminate density lesion in the posterior
right hepatic lobe (series 2, image 18). Additional adjacent subtle
subcentimeter indeterminate lesion in the right hepatic lobe. Liver
appears otherwise within normal limits. Gallbladder within normal
limits. No hyperdense gallstone. No biliary dilatation.

Pancreas: Unremarkable. No pancreatic ductal dilatation or
surrounding inflammatory changes.

Spleen: Spleen is mildly enlarged.  No focal splenic abnormality.

Adrenals/Urinary Tract: Unremarkable adrenal glands. Kidneys enhance
symmetrically without focal lesion, stone, or hydronephrosis.
Ureters are nondilated. Urinary bladder appears unremarkable.

Stomach/Bowel: Stomach is within normal limits. Oral contrast is
present throughout the bowel. No dilated loops of bowel to suggest
obstruction. Appendix not definitively seen. No pericecal
inflammatory changes. There is a short segment of mild
circumferential wall bowel thickening at the rectosigmoid junction
(series 2, image 69). No focally inflamed diverticula at this
location. There are a few scattered diverticula throughout the
colon, predominantly left-sided. No pericolonic inflammatory
changes.

Vascular/Lymphatic: Scattered aortoiliac atherosclerotic
calcifications without aneurysm. No abdominopelvic lymphadenopathy.

Reproductive: Mildly enlarged prostate gland.

Other: Nodular soft tissue density along the peritoneal surface
adjacent to the inferior right hepatic lobe posteriorly measuring 18
x 8 x 13 mm (series 2, image 31). No free fluid. No abdominopelvic
fluid collection. No pneumoperitoneum. No abdominal wall hernia.
Trace left hydrocele.

Musculoskeletal: Facet predominant degenerative changes of the
lumbar spine. Degenerative changes of the bilateral hips. No acute
osseous findings.
IMPRESSION: 1. Short segment of mild circumferential wall thickening at the
rectosigmoid junction which may represent a mild infectious or
inflammatory colitis.
2. Nodular soft tissue density along the peritoneal surface adjacent
to the inferior right hepatic lobe posteriorly measuring 18 x 8 x 13
mm. Findings are nonspecific and could represent an area of
scarring. However, a peritoneal implant is not excluded. Comparison
with prior outside cross-sectional imaging of the abdomen would be
helpful to establish stability of this finding. If no prior imaging
is available, recommend follow-up abdominal CT in 6-12 months.
3. Indeterminate density 10 mm lesion in the posterior right hepatic
lobe. Additional subtle subcentimeter indeterminate lesion in the
right hepatic lobe. Further evaluation with nonemergent MRI of the
abdomen with and without contrast is recommended.
4. Mild splenomegaly.
5. Colonic diverticulosis without evidence of acute diverticulitis.
6. Mildly enlarged prostate gland.
7. Trace left hydrocele.

Aortic Atherosclerosis ([id number]).

## 2023-07-18 DIAGNOSIS — Z0100 Encounter for examination of eyes and vision without abnormal findings: Secondary | ICD-10-CM | POA: Diagnosis not present

## 2023-07-18 DIAGNOSIS — H5203 Hypermetropia, bilateral: Secondary | ICD-10-CM | POA: Diagnosis not present

## 2023-08-06 DIAGNOSIS — D225 Melanocytic nevi of trunk: Secondary | ICD-10-CM | POA: Diagnosis not present

## 2023-08-06 DIAGNOSIS — L814 Other melanin hyperpigmentation: Secondary | ICD-10-CM | POA: Diagnosis not present

## 2023-08-06 DIAGNOSIS — L821 Other seborrheic keratosis: Secondary | ICD-10-CM | POA: Diagnosis not present

## 2023-10-11 DIAGNOSIS — E785 Hyperlipidemia, unspecified: Secondary | ICD-10-CM | POA: Diagnosis not present

## 2023-10-11 DIAGNOSIS — Z87891 Personal history of nicotine dependence: Secondary | ICD-10-CM | POA: Diagnosis not present

## 2023-10-11 DIAGNOSIS — Z7982 Long term (current) use of aspirin: Secondary | ICD-10-CM | POA: Diagnosis not present

## 2023-11-10 ENCOUNTER — Other Ambulatory Visit: Payer: Self-pay | Admitting: Internal Medicine

## 2023-11-30 ENCOUNTER — Other Ambulatory Visit: Payer: Self-pay | Admitting: Internal Medicine

## 2023-12-07 ENCOUNTER — Other Ambulatory Visit: Payer: Self-pay

## 2023-12-10 ENCOUNTER — Other Ambulatory Visit: Payer: Self-pay | Admitting: *Deleted

## 2023-12-10 ENCOUNTER — Encounter: Payer: Self-pay | Admitting: Internal Medicine

## 2023-12-10 ENCOUNTER — Ambulatory Visit (INDEPENDENT_AMBULATORY_CARE_PROVIDER_SITE_OTHER): Payer: Medicare HMO | Admitting: Internal Medicine

## 2023-12-10 VITALS — BP 126/74 | HR 56 | Temp 98.1°F | Resp 16 | Ht 75.0 in | Wt 204.0 lb

## 2023-12-10 DIAGNOSIS — E785 Hyperlipidemia, unspecified: Secondary | ICD-10-CM

## 2023-12-10 DIAGNOSIS — R7989 Other specified abnormal findings of blood chemistry: Secondary | ICD-10-CM

## 2023-12-10 DIAGNOSIS — D7589 Other specified diseases of blood and blood-forming organs: Secondary | ICD-10-CM

## 2023-12-10 DIAGNOSIS — Z0000 Encounter for general adult medical examination without abnormal findings: Secondary | ICD-10-CM

## 2023-12-10 LAB — CBC WITH DIFFERENTIAL/PLATELET
Basophils Absolute: 0 10*3/uL (ref 0.0–0.1)
Basophils Relative: 0.6 % (ref 0.0–3.0)
Eosinophils Absolute: 0.4 10*3/uL (ref 0.0–0.7)
Eosinophils Relative: 6.8 % — ABNORMAL HIGH (ref 0.0–5.0)
HCT: 40.5 % (ref 39.0–52.0)
Hemoglobin: 13.8 g/dL (ref 13.0–17.0)
Lymphocytes Relative: 45.8 % (ref 12.0–46.0)
Lymphs Abs: 2.4 10*3/uL (ref 0.7–4.0)
MCHC: 34.1 g/dL (ref 30.0–36.0)
MCV: 104.7 fL — ABNORMAL HIGH (ref 78.0–100.0)
Monocytes Absolute: 0.7 10*3/uL (ref 0.1–1.0)
Monocytes Relative: 12.3 % — ABNORMAL HIGH (ref 3.0–12.0)
Neutro Abs: 1.8 10*3/uL (ref 1.4–7.7)
Neutrophils Relative %: 34.5 % — ABNORMAL LOW (ref 43.0–77.0)
Platelets: 344 10*3/uL (ref 150.0–400.0)
RBC: 3.87 Mil/uL — ABNORMAL LOW (ref 4.22–5.81)
RDW: 14.6 % (ref 11.5–15.5)
WBC: 5.3 10*3/uL (ref 4.0–10.5)

## 2023-12-10 LAB — COMPREHENSIVE METABOLIC PANEL
ALT: 18 U/L (ref 0–53)
AST: 20 U/L (ref 0–37)
Albumin: 4.5 g/dL (ref 3.5–5.2)
Alkaline Phosphatase: 76 U/L (ref 39–117)
BUN: 16 mg/dL (ref 6–23)
CO2: 29 meq/L (ref 19–32)
Calcium: 9.4 mg/dL (ref 8.4–10.5)
Chloride: 104 meq/L (ref 96–112)
Creatinine, Ser: 0.84 mg/dL (ref 0.40–1.50)
GFR: 88.21 mL/min (ref >60.00)
Glucose, Bld: 95 mg/dL (ref 70–99)
Potassium: 4.8 meq/L (ref 3.5–5.1)
Sodium: 139 meq/L (ref 135–145)
Total Bilirubin: 0.6 mg/dL (ref 0.2–1.2)
Total Protein: 6.8 g/dL (ref 6.0–8.3)

## 2023-12-10 LAB — LIPID PANEL
Cholesterol: 156 mg/dL (ref 0–200)
HDL: 41 mg/dL (ref >39.00)
LDL Cholesterol: 87 mg/dL (ref 0–99)
NonHDL: 114.85
Total CHOL/HDL Ratio: 4
Triglycerides: 140 mg/dL (ref 0.0–149.0)
VLDL: 28 mg/dL (ref 0.0–40.0)

## 2023-12-10 LAB — B12 AND FOLATE PANEL
Folate: >24.2 ng/mL (ref >5.9)
Vitamin B-12: 577 pg/mL (ref 211–911)

## 2023-12-10 LAB — PSA: PSA: 1.1 ng/mL (ref 0.10–4.00)

## 2023-12-10 NOTE — Assessment & Plan Note (Signed)
Here for CPX - Tdap 11/2021 -  zostavax: 2014 ;  s/p shingrix x 2 -  Prevnar: 2019;  PNM 23: 10-2019 -Had a flu and a covid  shot.  -CCS: Cscope July 2004 -- bx- tubular adenomas and hyperplastic polyps. Cscope2/2009 colon two polyps, tics, hemorrhoids.   Cscope 09-2013, 03/2020, next 5 years per GI letter  -Prostate cancer screening: check PSA, no symptoms -Diet exercise: Doing well. -Labs: CMP FLP PSA CBC vitamin B12, folic acid. - Healthcare POA: See AVS

## 2023-12-10 NOTE — Assessment & Plan Note (Signed)
Here for CPX  Dyslipidemia: On Pravachol and Zetia, checking labs. Aspirin: + FH CAD, continue ASA Abnormal CBC: MCV elevated consistently, checking CBC and vitamins. Social: Retired few months ago, having more time to exercise, eats healthy. RTC 1 year

## 2023-12-10 NOTE — Patient Instructions (Signed)
Please read the information about the healthcare power of attorney  GO TO THE LAB : Get the blood work     Next visit with me in 1 year Please schedule it at the front desk     "Health Care Power of attorney" ,  "Living will" (Advance care planning documents)  If you already have a living will or healthcare power of attorney, is recommended you bring the copy to be scanned in your chart.   The document will be available to all the doctors you see in the system.  Advance care planning is a process that supports adults in  understanding and sharing their preferences regarding future medical care.  The patient's preferences are recorded in documents called Advance Directives and the can be modified at any time while the patient is in full mental capacity.   If you don't have one, please consider create one.      More information at: [email or web address]

## 2023-12-10 NOTE — Progress Notes (Signed)
Subjective:    Patient ID: Christopher Santos, male    DOB: 11-Sep-1951, 72 y.o.   MRN: [id number]  DOS:  12/10/2023 Type of visit - description: cpx  Feeling well.  Has no major concerns, occasionally has blisters and shallow ulcers on his mouth, has seen a dentist, getting better.  Denies any concomitant eye irritation, no dysuria.  Review of Systems  Other than above, a 14 point review of systems is negative       Past Medical History:  Diagnosis Date   Allergy    Blood transfusion without reported diagnosis    01-26-2020   COVID-19 virus infection 01/26/2020   Hyperlipemia    Hypertension    on occasion but not treated     Past Surgical History:  Procedure Laterality Date   COLONOSCOPY     INGUINAL HERNIA REPAIR Right 01/26/2022   Procedure: OPEN RIGHT INGUINAL HERNIA REPAIR WITH MESH;  Surgeon: Abigail Miyamoto, MD;  Location: [facility];  Service: General;  Laterality: Right;   POLYPECTOMY     TONSILLECTOMY     at age 52   Social History   Socioeconomic History   Marital status: Married    Spouse name: Not on file   Number of children: 0   Years of education: Not on file   Highest education level: Not on file  Occupational History   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 , [employer] (used to be Advertising account planner)  Tobacco Use   Smoking status: Former    Current packs/day: 0.00    Types: Cigarettes    Quit date: 1993    Years since quitting: 31.9   Smokeless tobacco: Never   Tobacco comments:    quit 1993, no heavy use   Vaping Use   Vaping status: Never Used  Substance and Sexual Activity   Alcohol use: Yes    Alcohol/week: 3.0 standard drinks of alcohol    Types: 3 Cans of beer per week    Comment: wine   Drug use: No   Sexual activity: Not on file  Other Topics Concern   Not on file  Social History Narrative   Married, no children, original from [place]   Social Drivers of Corporate investment banker Strain: Not on file  Food Insecurity: Not on  file  Transportation Needs: Not on file  Physical Activity: Not on file  Stress: Not on file  Social Connections: Not on file  Intimate Partner Violence: Not on file     Current Outpatient Medications  Medication Instructions   aspirin 81 mg, Daily at bedtime   ezetimibe (ZETIA) 10 mg, Oral, Daily   pravastatin (PRAVACHOL) 80 mg, Oral, Daily at bedtime   vitamin C 1,000 mg, Daily       Objective:   Physical Exam BP 126/74   Pulse (!) 56   Temp 98.1 F (36.7 C) (Oral)   Resp 16   Ht 6\' 3"  (1.905 m)   Wt 204 lb (92.5 kg)   SpO2 96%   BMI 25.50 kg/m  General: Well developed, NAD, BMI noted Neck: No  thyromegaly  HEENT:  Normocephalic . Face symmetric, atraumatic Lungs:  CTA B Normal respiratory effort, no intercostal retractions, no accessory muscle use. Heart: RRR,  no murmur.  Abdomen:  Not distended, soft, non-tender. No rebound or rigidity.   Lower extremities: no pretibial edema bilaterally  Skin: Exposed areas without rash. Not pale. Not jaundice Neurologic:  alert & oriented X3.  Speech  normal, gait appropriate for age and unassisted Strength symmetric and appropriate for age.  Psych: Cognition and judgment appear intact.  Cooperative with normal attention span and concentration.  Behavior appropriate. No anxious or depressed appearing.     Assessment    Assessment  Dyslipidemia -simvastatin d/c b/c  not effective enough -Lipitor, worked but question of s/w (aches) L wrist Fx 2015  Fever blisters- valtrex rarely Covid dx ~ 01/2020  PLAN: Here for CPX - Tdap 11/2021 -  zostavax: 2014 ;  s/p shingrix x 2 -  Prevnar: 2019;  PNM 23: 10-2019 -Had a flu and a covid  shot.  -CCS: Cscope July 2004 -- bx- tubular adenomas and hyperplastic polyps. Cscope2/2009 colon two polyps, tics, hemorrhoids.   Cscope 09-2013, 03/2020, next 5 years per GI letter  -Prostate cancer screening: check PSA, no symptoms -Diet exercise: Doing well. -Labs: CMP FLP PSA  CBC vitamin B12, folic acid. - Healthcare POA: See AVS Dyslipidemia: On Pravachol and Zetia, checking labs. Aspirin: + FH CAD, continue ASA Abnormal CBC: MCV elevated consistently, checking CBC and vitamins. Social: Retired few months ago, having more time to exercise, eats healthy. RTC 1 year

## 2023-12-11 ENCOUNTER — Ambulatory Visit: Payer: Medicare HMO

## 2023-12-11 DIAGNOSIS — D649 Anemia, unspecified: Secondary | ICD-10-CM | POA: Diagnosis not present

## 2023-12-11 DIAGNOSIS — R7989 Other specified abnormal findings of blood chemistry: Secondary | ICD-10-CM

## 2023-12-14 LAB — PATHOLOGIST SMEAR REVIEW

## 2023-12-14 NOTE — Addendum Note (Signed)
Addended byConrad Benzie D on: 12/14/2023 03:58 PM   Modules accepted: Orders

## 2023-12-25 ENCOUNTER — Encounter: Payer: Self-pay | Admitting: Oncology

## 2023-12-25 ENCOUNTER — Inpatient Hospital Stay: Payer: Medicare HMO | Attending: Oncology | Admitting: Oncology

## 2023-12-25 ENCOUNTER — Inpatient Hospital Stay: Payer: Medicare HMO

## 2023-12-25 VITALS — BP 160/84 | HR 66 | Temp 98.3°F | Wt 207.0 lb

## 2023-12-25 DIAGNOSIS — Z87891 Personal history of nicotine dependence: Secondary | ICD-10-CM | POA: Diagnosis not present

## 2023-12-25 DIAGNOSIS — E785 Hyperlipidemia, unspecified: Secondary | ICD-10-CM | POA: Insufficient documentation

## 2023-12-25 DIAGNOSIS — D7589 Other specified diseases of blood and blood-forming organs: Secondary | ICD-10-CM | POA: Insufficient documentation

## 2023-12-25 DIAGNOSIS — Z8616 Personal history of COVID-19: Secondary | ICD-10-CM | POA: Diagnosis not present

## 2023-12-25 DIAGNOSIS — Z8249 Family history of ischemic heart disease and other diseases of the circulatory system: Secondary | ICD-10-CM | POA: Diagnosis not present

## 2023-12-25 DIAGNOSIS — D509 Iron deficiency anemia, unspecified: Secondary | ICD-10-CM | POA: Insufficient documentation

## 2023-12-25 DIAGNOSIS — Z79899 Other long term (current) drug therapy: Secondary | ICD-10-CM | POA: Insufficient documentation

## 2023-12-25 DIAGNOSIS — Z9089 Acquired absence of other organs: Secondary | ICD-10-CM | POA: Diagnosis not present

## 2023-12-25 LAB — CBC (CANCER CENTER ONLY)
HCT: 39.9 % (ref 39.0–52.0)
Hemoglobin: 13.5 g/dL (ref 13.0–17.0)
MCH: 34.7 pg — ABNORMAL HIGH (ref 26.0–34.0)
MCHC: 33.8 g/dL (ref 30.0–36.0)
MCV: 102.6 fL — ABNORMAL HIGH (ref 80.0–100.0)
Platelet Count: 270 10*3/uL (ref 150–400)
RBC: 3.89 MIL/uL — ABNORMAL LOW (ref 4.22–5.81)
RDW: 13.9 % (ref 11.5–15.5)
WBC Count: 5.7 10*3/uL (ref 4.0–10.5)
nRBC: 0 % (ref 0.0–0.2)

## 2023-12-25 LAB — IRON AND TIBC
Iron: 96 ug/dL (ref 45–182)
Saturation Ratios: 26 % (ref 17.9–39.5)
TIBC: 365 ug/dL (ref 250–450)
UIBC: 269 ug/dL

## 2023-12-25 LAB — FERRITIN: Ferritin: 54 ng/mL (ref 24–336)

## 2023-12-25 NOTE — Progress Notes (Signed)
 [facility]  [phone] [phone]  ID: Christopher Santos OB: 06-27-1951  MR#: [id number]  RDW#:[id number]  Patient Care Team: Amon Aloysius BRAVO, MD as PCP - General Abran Norleen SAILOR, MD as Consulting Physician (Gastroenterology)  CHIEF COMPLAINT: Macrocytosis without anemia  INTERVAL HISTORY: Patient is a 72 year old male who is noted to have a macrocytosis with normal B12 and folate levels on routine blood work.  He is referred for further evaluation.  He currently feels well and is asymptomatic.  He has no neurologic complaints.  He denies any recent fevers or illnesses.  He has a good appetite and denies weight loss.  He has no weakness or fatigue.  He denies any new medications.  He has no chest pain, shortness of breath, cough, or hemoptysis.  He denies any nausea, vomiting, constipation, or diarrhea.  He has no urinary complaints.  Patient feels at his baseline and offers no specific complaints today.  REVIEW OF SYSTEMS:   Review of Systems  Constitutional: Negative.  Negative for fever, malaise/fatigue and weight loss.  Respiratory: Negative.  Negative for cough, hemoptysis and shortness of breath.   Cardiovascular: Negative.  Negative for chest pain and leg swelling.  Gastrointestinal: Negative.  Negative for abdominal pain.  Genitourinary: Negative.  Negative for dysuria.  Musculoskeletal: Negative.  Negative for back pain.  Skin: Negative.  Negative for rash.  Neurological: Negative.  Negative for dizziness, focal weakness, weakness and headaches.  Psychiatric/Behavioral: Negative.  The patient is not nervous/anxious.     As per HPI. Otherwise, a complete review of systems is negative.  PAST MEDICAL HISTORY: Past Medical History:  Diagnosis Date   Allergy    Blood transfusion without reported diagnosis    01-26-2020   COVID-19 virus infection 01/26/2020   Hyperlipemia    Hypertension    on occasion but not treated     PAST SURGICAL  HISTORY: Past Surgical History:  Procedure Laterality Date   COLONOSCOPY     HERNIA REPAIR  2023   INGUINAL HERNIA REPAIR Right 01/26/2022   Procedure: OPEN RIGHT INGUINAL HERNIA REPAIR WITH MESH;  Surgeon: Vernetta Berg, MD;  Location: [facility];  Service: General;  Laterality: Right;   POLYPECTOMY     TONSILLECTOMY     at age 53    FAMILY HISTORY: Family History  Problem Relation Age of Onset   CAD Father 50       MI at 60   Diabetes Neg Hx    Colon cancer Neg Hx    Prostate cancer Neg Hx    Colon polyps Neg Hx    Esophageal cancer Neg Hx    Rectal cancer Neg Hx    Stomach cancer Neg Hx     ADVANCED DIRECTIVES (Y/N):  N  HEALTH MAINTENANCE: Social History   Tobacco Use   Smoking status: Former    Current packs/day: 0.00    Average packs/day: 0.5 packs/day for 15.0 years (7.5 ttl pk-yrs)    Types: Cigarettes    Quit date: 2    Years since quitting: 32.0   Smokeless tobacco: Never   Tobacco comments:    quit 1993, no heavy use   Vaping Use   Vaping status: Never Used  Substance Use Topics   Alcohol use: Yes    Alcohol/week: 8.0 standard drinks of alcohol    Types: 2 Glasses of wine, 2 Cans of beer, 4 Shots of liquor per week    Comment: wine   Drug use:  No     Colonoscopy:  PAP:  Bone density:  Lipid panel:  No Known Allergies  Current Outpatient Medications  Medication Sig Dispense Refill   Ascorbic Acid  (VITAMIN C) 1000 MG tablet Take 1,000 mg by mouth daily.     aspirin  81 MG tablet Take 81 mg by mouth at bedtime.      b complex vitamins capsule Take 1 capsule by mouth daily.     cyanocobalamin  (VITAMIN B12) 500 MCG tablet Take 500 mcg by mouth daily.     ezetimibe  (ZETIA ) 10 MG tablet Take 1 tablet (10 mg total) by mouth daily. 90 tablet 0   L-Lysine 500 MG CAPS Take by mouth.     magnesium oxide (MAG-OX) 400 (240 Mg) MG tablet Take 400 mg by mouth daily.     Omega-3 Fatty Acids (OMEGA 3 500 PO) Take by mouth.      pravastatin  (PRAVACHOL ) 80 MG tablet Take 1 tablet (80 mg total) by mouth at bedtime. 90 tablet 1   vitamin E 200 UNIT capsule Take 200 Units by mouth daily.     No current facility-administered medications for this visit.    OBJECTIVE: Vitals:   12/25/23 1326  BP: (!) 160/84  Pulse: 66  Temp: 98.3 F (36.8 C)  SpO2: 100%     Body mass index is 25.87 kg/m.    ECOG FS:0 - Asymptomatic  General: Well-developed, well-nourished, no acute distress. Eyes: Pink conjunctiva, anicteric sclera. HEENT: Normocephalic, moist mucous membranes. Lungs: No audible wheezing or coughing. Heart: Regular rate and rhythm. Abdomen: Soft, nontender, no obvious distention. Musculoskeletal: No edema, cyanosis, or clubbing. Neuro: Alert, answering all questions appropriately. Cranial nerves grossly intact. Skin: No rashes or petechiae noted. Psych: Normal affect. Lymphatics: No cervical, calvicular, axillary or inguinal LAD.   LAB RESULTS:  Lab Results  Component Value Date   NA 139 12/10/2023   K 4.8 12/10/2023   CL 104 12/10/2023   CO2 29 12/10/2023   GLUCOSE 95 12/10/2023   BUN 16 12/10/2023   CREATININE 0.84 12/10/2023   CALCIUM  9.4 12/10/2023   PROT 6.8 12/10/2023   ALBUMIN 4.5 12/10/2023   AST 20 12/10/2023   ALT 18 12/10/2023   ALKPHOS 76 12/10/2023   BILITOT 0.6 12/10/2023   GFRNONAA >60 01/31/2020   GFRAA >60 01/31/2020    Lab Results  Component Value Date   WBC 5.7 12/25/2023   NEUTROABS 1.8 12/10/2023   HGB 13.5 12/25/2023   HCT 39.9 12/25/2023   MCV 102.6 (H) 12/25/2023   PLT 270 12/25/2023     STUDIES: No results found.  ASSESSMENT: Macrocytosis without anemia.  PLAN:    Macrocytosis without anemia: Unclear etiology.  Patient's hemoglobin continues to be within normal limits at 13.5.  He has a mildly elevated MCV at 102.6, but previously his B12 and folate levels were within normal limits.  Possible etiologies could be liver disease from alcohol use, but  patient's most recent metabolic panel was within normal limits.  Patient does not have a history of hypothyroidism.  He is not on any medications that would induce macrocytosis.  SPEP, hemoglobin fractionation cascade, and IntelliGEN Myeloid panel were ordered for completeness and are pending at time of dictation.  No intervention is needed at this time.  Patient does not require bone marrow biopsy.  Patient will have video-assisted telemedicine visit in 3 weeks to discuss his results.  I spent a total of 45 minutes reviewing chart data, face-to-face evaluation with the patient, counseling and  coordination of care as detailed above.   Patient expressed understanding and was in agreement with this plan. He also understands that He can call clinic at any time with any questions, concerns, or complaints.    Evalene JINNY Reusing, MD   12/25/2023 2:29 PM

## 2023-12-30 LAB — PROTEIN ELECTROPHORESIS, SERUM
A/G Ratio: 2 — ABNORMAL HIGH (ref 0.7–1.7)
Albumin ELP: 4.5 g/dL — ABNORMAL HIGH (ref 2.9–4.4)
Alpha-1-Globulin: 0.2 g/dL (ref 0.0–0.4)
Alpha-2-Globulin: 0.5 g/dL (ref 0.4–1.0)
Beta Globulin: 0.9 g/dL (ref 0.7–1.3)
Gamma Globulin: 0.8 g/dL (ref 0.4–1.8)
Globulin, Total: 2.3 g/dL (ref 2.2–3.9)
Total Protein ELP: 6.8 g/dL (ref 6.0–8.5)

## 2023-12-30 LAB — HGB FRACTIONATION CASCADE
Hgb A2: 2.6 % (ref 1.8–3.2)
Hgb A: 97.4 % (ref 96.4–98.8)
Hgb F: 0 % (ref 0.0–2.0)
Hgb S: 0 %

## 2024-01-07 LAB — INTELLIGEN MYELOID

## 2024-01-16 ENCOUNTER — Inpatient Hospital Stay: Payer: Medicare HMO | Attending: Oncology | Admitting: Oncology

## 2024-01-16 DIAGNOSIS — D7589 Other specified diseases of blood and blood-forming organs: Secondary | ICD-10-CM | POA: Diagnosis not present

## 2024-01-16 NOTE — Progress Notes (Signed)
[facility]  [phone]) [phone] [phone]  ID: Christopher Santos OB: May 25, 1951  MR#: [id number]  QIO#:[id number]  Patient Care Team: Wanda Plump, MD as PCP - General Hilarie Fredrickson, MD as Consulting Physician (Gastroenterology)  I connected with Christopher Santos on 01/16/24 at 10:15 AM EST by video enabled telemedicine visit and verified that I am speaking with the correct person using two identifiers.   I discussed the limitations, risks, security and privacy concerns of performing an evaluation and management service by telemedicine and the availability of in-person appointments. I also discussed with the patient that there may be a patient responsible charge related to this service. The patient expressed understanding and agreed to proceed.   Other persons participating in the visit and their role in the encounter: Patient, MD.  Patient's location: Home. Provider's location: Clinic.  CHIEF COMPLAINT: Macrocytosis without anemia  INTERVAL HISTORY: Patient agreed to video-assisted telemedicine visit for further evaluation and discussion of his laboratory results.  He continues to feel well and remains asymptomatic.  He has no neurologic complaints.  He denies any recent fevers or illnesses.  He has a good appetite and denies weight loss.  He denies any weakness or fatigue.  He has no chest pain, shortness of breath, cough, or hemoptysis.  He denies any nausea, vomiting, constipation, or diarrhea.  He has no urinary complaints.  Patient offers no specific complaints today.  REVIEW OF SYSTEMS:   Review of Systems  Constitutional: Negative.  Negative for fever, malaise/fatigue and weight loss.  Respiratory: Negative.  Negative for cough, hemoptysis and shortness of breath.   Cardiovascular: Negative.  Negative for chest pain and leg swelling.  Gastrointestinal: Negative.  Negative for abdominal pain.  Genitourinary: Negative.  Negative for dysuria.   Musculoskeletal: Negative.  Negative for back pain.  Skin: Negative.  Negative for rash.  Neurological: Negative.  Negative for dizziness, focal weakness, weakness and headaches.  Psychiatric/Behavioral: Negative.  The patient is not nervous/anxious.     As per HPI. Otherwise, a complete review of systems is negative.  PAST MEDICAL HISTORY: Past Medical History:  Diagnosis Date   Allergy    Blood transfusion without reported diagnosis    01-26-2020   COVID-19 virus infection 01/26/2020   Hyperlipemia    Hypertension    on occasion but not treated     PAST SURGICAL HISTORY: Past Surgical History:  Procedure Laterality Date   COLONOSCOPY     HERNIA REPAIR  2023   INGUINAL HERNIA REPAIR Right 01/26/2022   Procedure: OPEN RIGHT INGUINAL HERNIA REPAIR WITH MESH;  Surgeon: Abigail Miyamoto, MD;  Location: [facility];  Service: General;  Laterality: Right;   POLYPECTOMY     TONSILLECTOMY     at age 58    FAMILY HISTORY: Family History  Problem Relation Age of Onset   CAD Father 20       MI at 12   Diabetes Neg Hx    Colon cancer Neg Hx    Prostate cancer Neg Hx    Colon polyps Neg Hx    Esophageal cancer Neg Hx    Rectal cancer Neg Hx    Stomach cancer Neg Hx     ADVANCED DIRECTIVES (Y/N):  N  HEALTH MAINTENANCE: Social History   Tobacco Use   Smoking status: Former    Current packs/day: 0.00    Average packs/day: 0.5 packs/day for 15.0 years (7.5 ttl pk-yrs)    Types: Cigarettes  Quit date: 61    Years since quitting: 32.0   Smokeless tobacco: Never   Tobacco comments:    quit 1993, no heavy use   Vaping Use   Vaping status: Never Used  Substance Use Topics   Alcohol use: Yes    Alcohol/week: 8.0 standard drinks of alcohol    Types: 2 Glasses of wine, 2 Cans of beer, 4 Shots of liquor per week    Comment: wine   Drug use: No     Colonoscopy:  PAP:  Bone density:  Lipid panel:  No Known Allergies  Current Outpatient Medications   Medication Sig Dispense Refill   Ascorbic Acid (VITAMIN C) 1000 MG tablet Take 1,000 mg by mouth daily.     aspirin 81 MG tablet Take 81 mg by mouth at bedtime.      b complex vitamins capsule Take 1 capsule by mouth daily.     cyanocobalamin (VITAMIN B12) 500 MCG tablet Take 500 mcg by mouth daily.     ezetimibe (ZETIA) 10 MG tablet Take 1 tablet (10 mg total) by mouth daily. 90 tablet 0   L-Lysine 500 MG CAPS Take by mouth.     magnesium oxide (MAG-OX) 400 (240 Mg) MG tablet Take 400 mg by mouth daily.     Omega-3 Fatty Acids (OMEGA 3 500 PO) Take by mouth.     pravastatin (PRAVACHOL) 80 MG tablet Take 1 tablet (80 mg total) by mouth at bedtime. 90 tablet 1   vitamin E 200 UNIT capsule Take 200 Units by mouth daily.     No current facility-administered medications for this visit.    OBJECTIVE: There were no vitals filed for this visit.    There is no height or weight on file to calculate BMI.    ECOG FS:0 - Asymptomatic  General: Well-developed, well-nourished, no acute distress. HEENT: Normocephalic. Neuro: Alert, answering all questions appropriately. Cranial nerves grossly intact. Psych: Normal affect.  LAB RESULTS:  Lab Results  Component Value Date   NA 139 12/10/2023   K 4.8 12/10/2023   CL 104 12/10/2023   CO2 29 12/10/2023   GLUCOSE 95 12/10/2023   BUN 16 12/10/2023   CREATININE 0.84 12/10/2023   CALCIUM 9.4 12/10/2023   PROT 6.8 12/10/2023   ALBUMIN 4.5 12/10/2023   AST 20 12/10/2023   ALT 18 12/10/2023   ALKPHOS 76 12/10/2023   BILITOT 0.6 12/10/2023   GFRNONAA >60 01/31/2020   GFRAA >60 01/31/2020    Lab Results  Component Value Date   WBC 5.7 12/25/2023   NEUTROABS 1.8 12/10/2023   HGB 13.5 12/25/2023   HCT 39.9 12/25/2023   MCV 102.6 (H) 12/25/2023   PLT 270 12/25/2023     STUDIES: No results found.  ASSESSMENT: Macrocytosis without anemia.  PLAN:    Macrocytosis without anemia: Unclear etiology.  Patient's hemoglobin continues to be  within normal limits at 13.5.  He has a mildly elevated MCV at 102.6, but previously his B12 and folate levels were within normal limits.  All of his other laboratory work including complete metabolic panel, iron stores, SPEP, hemoglobin fractionation cascade, and IntelliGEN Myeloid panel are all either negative or within normal limits.  No intervention is needed.  Patient does not require bone marrow biopsy.  No further follow-up has been scheduled.  Please refer patient back if there are any questions or concerns.    I provided 20 minutes of face-to-face video visit time during this encounter which included chart review, counseling,  and coordination of care as documented above.    Patient expressed understanding and was in agreement with this plan. He also understands that He can call clinic at any time with any questions, concerns, or complaints.    Jeralyn Ruths, MD   01/16/2024 10:08 AM

## 2024-01-18 IMAGING — CT CT ABD-PELV W/ CM
2 of 5 series · 16 of 46 positions shown, 18 images · IV contrast (OMNIPAQUE)
Comparison: 07/20/2021

CLINICAL DATA: Left lower quadrant pain

EXAM:
CT ABDOMEN AND PELVIS WITH CONTRAST
TECHNIQUE: Multidetector CT imaging of the abdomen and pelvis was performed
using the standard protocol following bolus administration of
intravenous contrast.

[Series 2: axial st · axial · 0.83mm/px · z∈[-504,-54]mm · 13 of 106 slices shown, 15 images]
[im 8/106  soft-tissue]
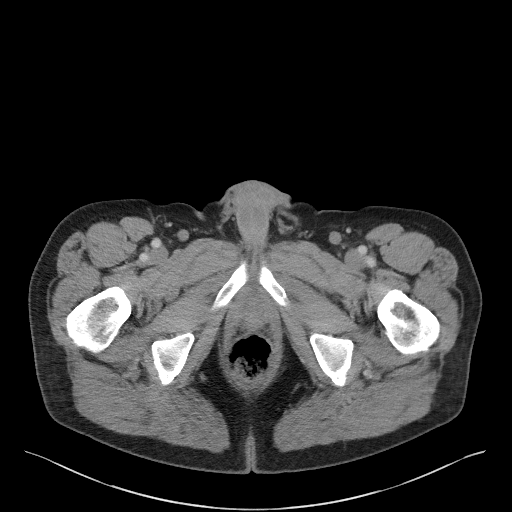
[im 8/106  bone]
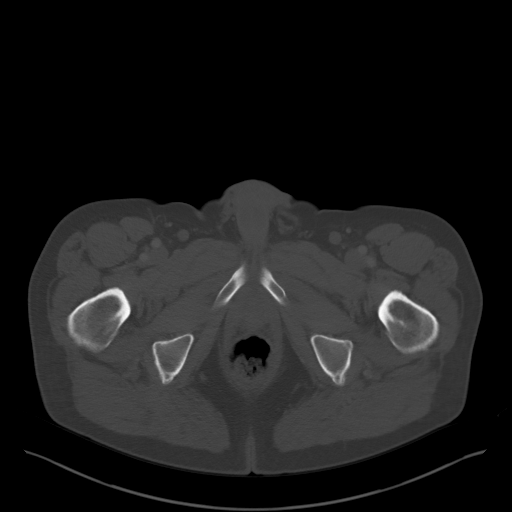
[im 16/106  soft-tissue]
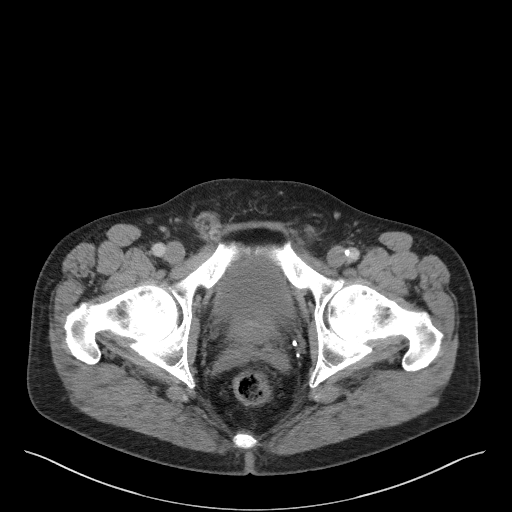
[im 23/106  soft-tissue]
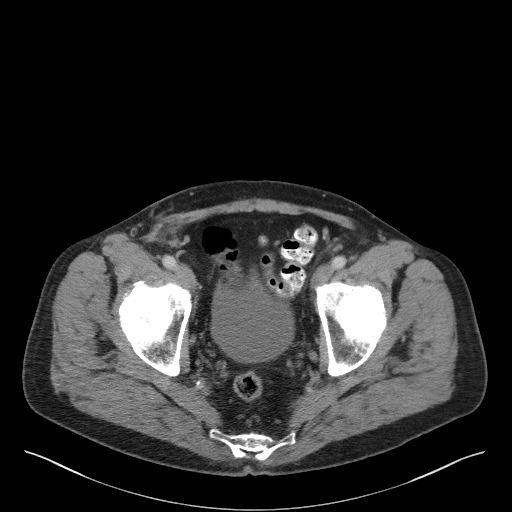
[im 31/106  soft-tissue]
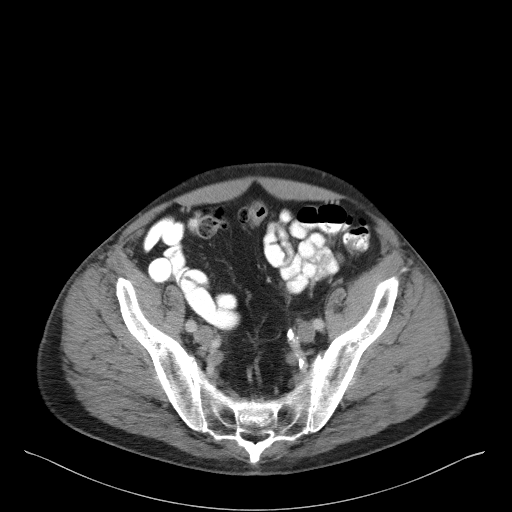
[im 38/106  soft-tissue]
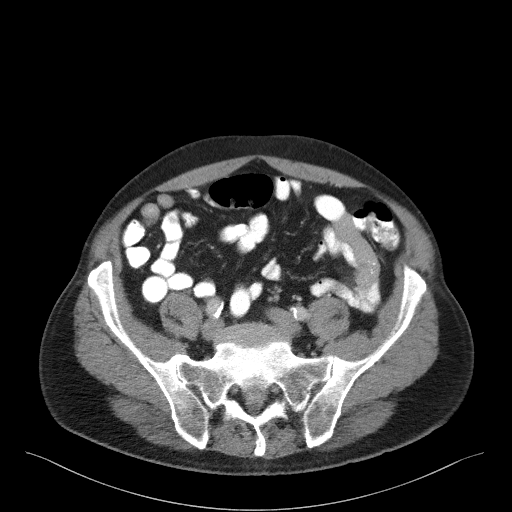
[im 46/106  soft-tissue]
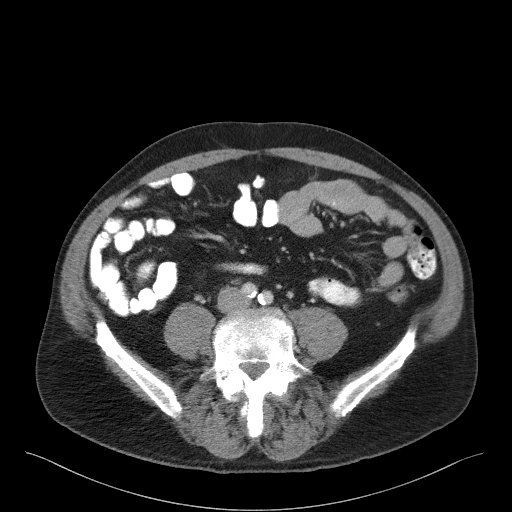
[im 53/106  soft-tissue]
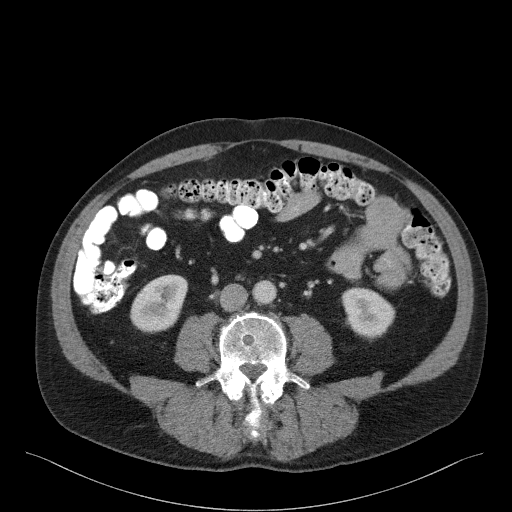
[im 61/106  soft-tissue]
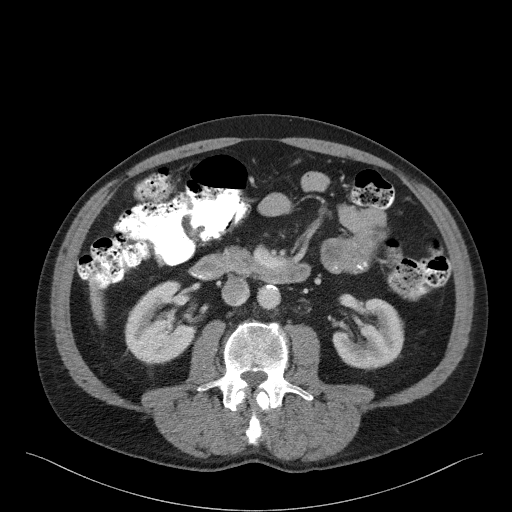
[im 68/106  soft-tissue]
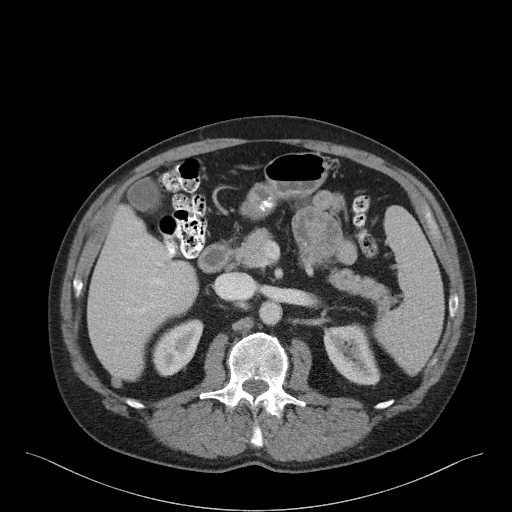
[im 68/106  bone]
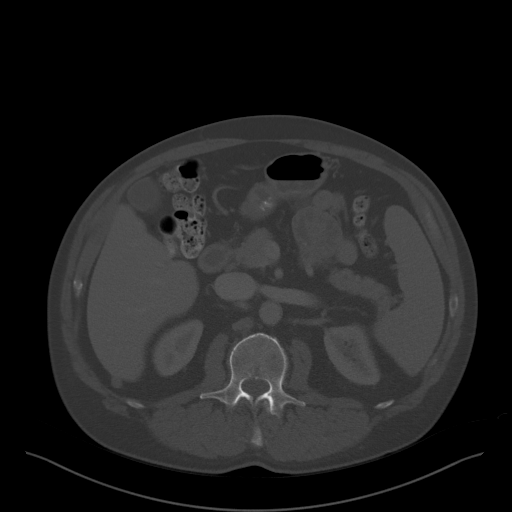
[im 76/106  soft-tissue]
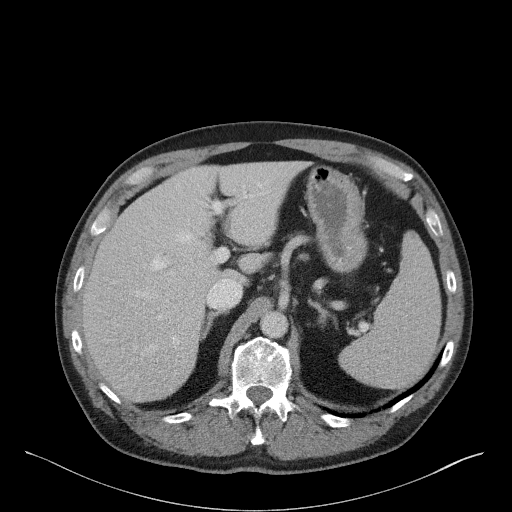
[im 83/106  soft-tissue]
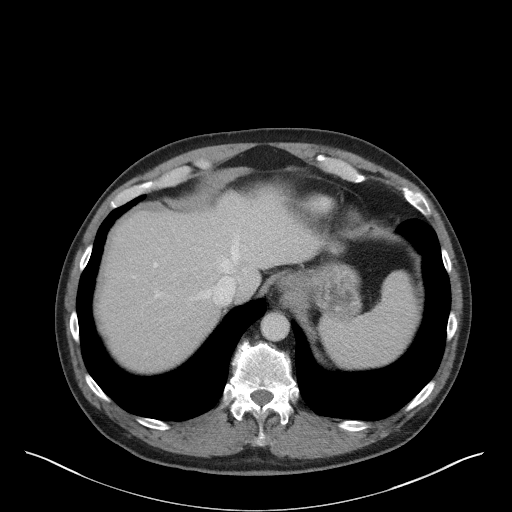
[im 91/106  soft-tissue]
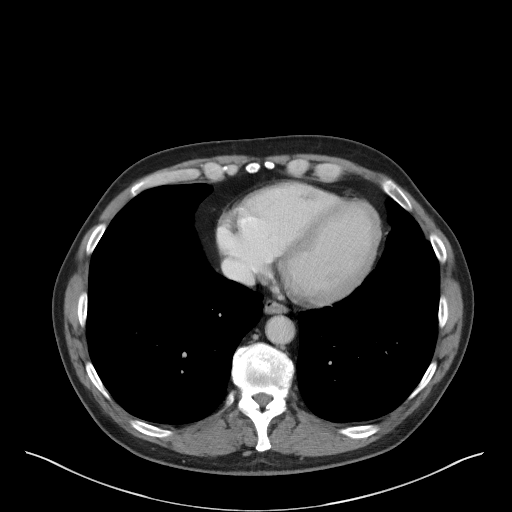
[im 98/106  soft-tissue]
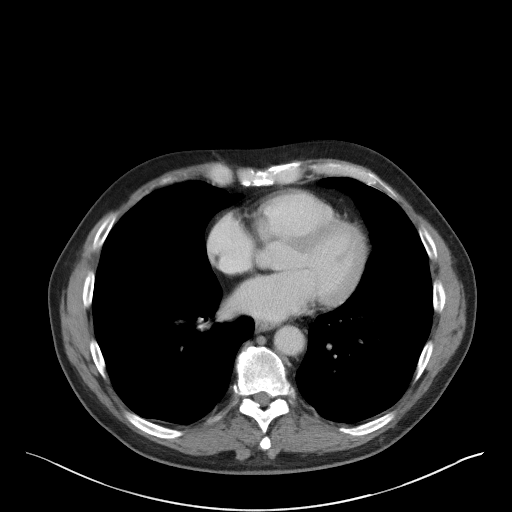

[Series 5: coronal st · coronal · 0.85mm/px · 3 of 92 slices shown]
[im 31/92  soft-tissue]
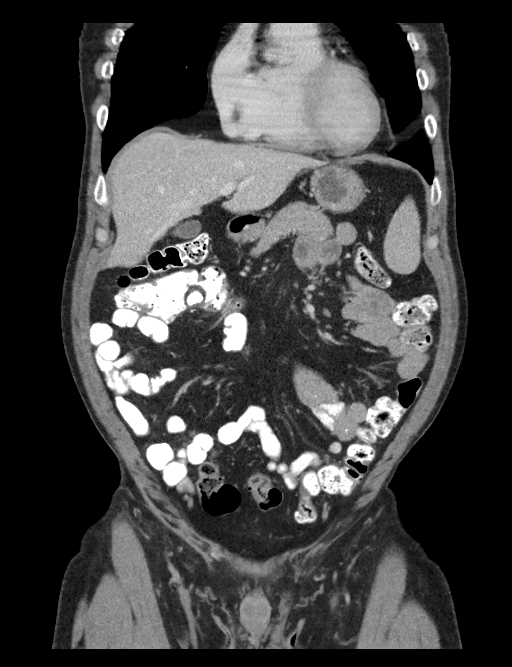
[im 41/92  soft-tissue]
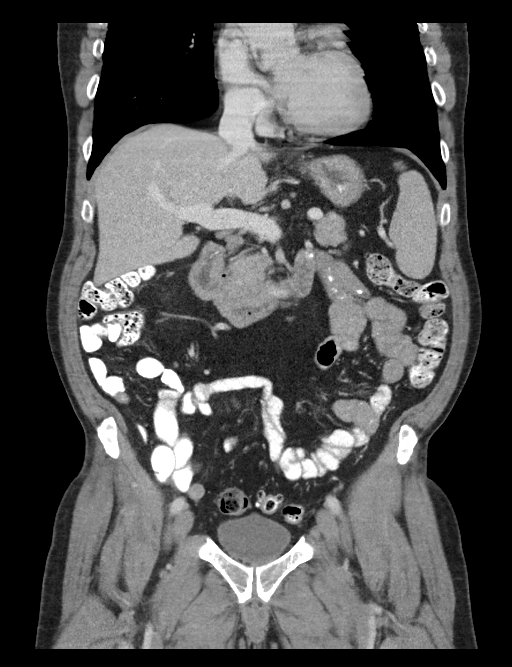
[im 51/92  soft-tissue]
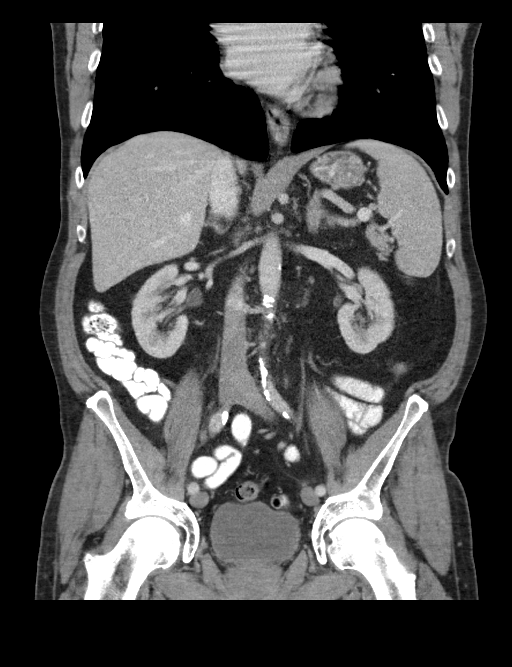

[16 of 46 positions shown; findings below may reference images not displayed]

RADIATION DOSE REDUCTION: This exam was performed according to the
departmental dose-optimization program which includes automated
exposure control, adjustment of the mA and/or kV according to
patient size and/or use of iterative reconstruction technique.

CONTRAST:  100mL OMNIPAQUE IOHEXOL 300 MG/ML  SOLN
FINDINGS: Lower chest: No acute abnormality

Hepatobiliary: Small low-density lesion posteriorly in the liver
measures 6 mm and is similar to prior study, favor small cyst or
hemangioma. Gallbladder unremarkable.

Pancreas: No focal abnormality or ductal dilatation.

Spleen: No focal abnormality.  Normal size.

Adrenals/Urinary Tract: No adrenal abnormality. No focal renal
abnormality. No stones or hydronephrosis. Urinary bladder is
unremarkable.

Stomach/Bowel: 5 5 Sigmoid diverticulosis. No active diverticulitis.
Stomach and small bowel decompressed, unremarkable.

Vascular/Lymphatic: Aortic atherosclerosis. No evidence of aneurysm
or adenopathy.

Reproductive: Prostate enlargement.

Other: No free fluid or free air.

Musculoskeletal: No acute bony abnormality.
IMPRESSION: No acute findings in the abdomen or pelvis.

Sigmoid diverticulosis.

Aortic atherosclerosis.

## 2024-02-21 DIAGNOSIS — D7589 Other specified diseases of blood and blood-forming organs: Secondary | ICD-10-CM | POA: Insufficient documentation

## 2024-02-21 NOTE — Patient Instructions (Signed)

## 2024-02-24 ENCOUNTER — Other Ambulatory Visit: Payer: Self-pay | Admitting: Internal Medicine

## 2024-02-28 ENCOUNTER — Encounter: Payer: Self-pay | Admitting: Nurse Practitioner

## 2024-02-28 ENCOUNTER — Ambulatory Visit (INDEPENDENT_AMBULATORY_CARE_PROVIDER_SITE_OTHER): Payer: Medicare HMO | Admitting: Nurse Practitioner

## 2024-02-28 VITALS — BP 128/71 | HR 61 | Temp 98.1°F | Resp 16 | Ht 75.08 in | Wt 207.6 lb

## 2024-02-28 DIAGNOSIS — D7589 Other specified diseases of blood and blood-forming organs: Secondary | ICD-10-CM | POA: Diagnosis not present

## 2024-02-28 DIAGNOSIS — M67911 Unspecified disorder of synovium and tendon, right shoulder: Secondary | ICD-10-CM

## 2024-02-28 DIAGNOSIS — E782 Mixed hyperlipidemia: Secondary | ICD-10-CM | POA: Diagnosis not present

## 2024-02-28 DIAGNOSIS — Z7689 Persons encountering health services in other specified circumstances: Secondary | ICD-10-CM

## 2024-02-28 NOTE — Assessment & Plan Note (Signed)
 Ongoing issue, history of injections about 4 years ago with ortho.  Also use NTG patches at time.  There is note of a possible small supraspinatus tear to left shoulder.  Overall steroid injections worked well for him and both shoulders were done on same day.  He would like to have this done again before traveling to [place].  Will plan to schedule beginning of July for steroid injections.  Consider PT or return to ortho if worsening.  He is very active at baseline.

## 2024-02-28 NOTE — Assessment & Plan Note (Signed)
 Chronic, stable.  Continue current medication regimen and adjust as needed.  Plan on labs in December.  Refills as needed.

## 2024-02-28 NOTE — Assessment & Plan Note (Signed)
 History of this, had full work-up with hematology which was overall reassuring and to return to them as needed.

## 2024-02-28 NOTE — Progress Notes (Signed)
 New Patient Office Visit  Subjective    Patient ID: Christopher Santos, male    DOB: Jan 23, 1951  Age: 73 y.o. MRN: [id number]  CC:  Chief Complaint  Patient presents with   Establish Care    Previous PCP was in [facility] also part of [facility]. No concerns just establish. Wife is also seen here.     HPI [name] presents for new patient visit to establish care.  Introduced to Publishing rights manager role and practice setting.  All questions answered. Discussed provider/patient relationship and expectations. Wife attends this clinic and wanted to transfer care here.  Has rotator cuff issues both sides. Had steroid injections 4 years ago, these offered benefit for a few weeks.  HYPERLIPIDEMIA Taking Pravastatin and Zetia.  Has a history of smoking for 15-20 years, stopped in 1993. Hyperlipidemia status: good compliance Satisfied with current treatment?  yes Side effects:  no Medication compliance: good compliance Supplements: fish oil Aspirin:  yes The 10-year ASCVD risk score (Arnett DK, et al., 2019) is: 17.5%   Values used to calculate the score:     Age: 41 years     Sex: Male     Is Non-Hispanic African American: No     Diabetic: No     Tobacco smoker: No     Systolic Blood Pressure: 128 mmHg     Is BP treated: No     HDL Cholesterol: 41 mg/dL     Total Cholesterol: 156 mg/dL Chest pain:  no Coronary artery disease:  no Family history CAD:  yes father passed from MI at 88 Family history early CAD:  no      02/28/2024    9:18 AM 12/25/2023    1:38 PM 12/10/2023    9:44 AM 12/06/2022    8:47 AM 11/30/2021    7:51 AM  Depression screen PHQ 2/9  Decreased Interest 0 0 0 0 0  Down, Depressed, Hopeless 0 0 0 0 0  PHQ - 2 Score 0 0 0 0 0       02/28/2024    9:18 AM  GAD 7 : Generalized Anxiety Score  Nervous, Anxious, on Edge 0  Control/stop worrying 0  Worry too much - different things 0  Trouble relaxing 0  Restless 0  Easily annoyed or irritable 0  Afraid -  awful might happen 0  Total GAD 7 Score 0    Outpatient Encounter Medications as of 02/28/2024  Medication Sig   Ascorbic Acid (VITAMIN C) 1000 MG tablet Take 1,000 mg by mouth daily.   aspirin 81 MG tablet Take 81 mg by mouth at bedtime.    b complex vitamins capsule Take 1 capsule by mouth daily.   cyanocobalamin (VITAMIN B12) 500 MCG tablet Take 500 mcg by mouth daily.   ezetimibe (ZETIA) 10 MG tablet Take 1 tablet (10 mg total) by mouth daily.   L-Lysine 500 MG CAPS Take by mouth.   magnesium oxide (MAG-OX) 400 (240 Mg) MG tablet Take 400 mg by mouth daily.   Omega-3 Fatty Acids (OMEGA 3 500 PO) Take by mouth.   pravastatin (PRAVACHOL) 80 MG tablet Take 1 tablet (80 mg total) by mouth at bedtime.   vitamin E 200 UNIT capsule Take 200 Units by mouth daily.   [DISCONTINUED] ezetimibe (ZETIA) 10 MG tablet Take 1 tablet (10 mg total) by mouth daily.   No facility-administered encounter medications on file as of 02/28/2024.    Past Medical History:  Diagnosis Date  Allergy    Blood transfusion without reported diagnosis    01-26-2020   COVID-19 virus infection 01/26/2020   Hyperlipemia    Hypertension    on occasion but not treated     Past Surgical History:  Procedure Laterality Date   COLONOSCOPY     HERNIA REPAIR  2023   INGUINAL HERNIA REPAIR Right 01/26/2022   Procedure: OPEN RIGHT INGUINAL HERNIA REPAIR WITH MESH;  Surgeon: Abigail Miyamoto, MD;  Location: [facility];  Service: General;  Laterality: Right;   POLYPECTOMY     TONSILLECTOMY     at age 45    Family History  Problem Relation Age of Onset   CAD Father 61       MI at 35   Rheum arthritis Brother    Diabetes Neg Hx    Colon cancer Neg Hx    Prostate cancer Neg Hx    Colon polyps Neg Hx    Esophageal cancer Neg Hx    Rectal cancer Neg Hx    Stomach cancer Neg Hx     Social History   Socioeconomic History   Marital status: Married    Spouse name: [name]   Number of children: 0    Years of education: Not on file   Highest education level: Not on file  Occupational History   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 , [employer] (used to be Advertising account planner)  Tobacco Use   Smoking status: Former    Current packs/day: 0.00    Average packs/day: 0.5 packs/day for 15.0 years (7.5 ttl pk-yrs)    Types: Cigarettes    Quit date: 1993    Years since quitting: 32.1   Smokeless tobacco: Never   Tobacco comments:    quit 1993, no heavy use   Vaping Use   Vaping status: Never Used  Substance and Sexual Activity   Alcohol use: Yes    Alcohol/week: 8.0 standard drinks of alcohol    Types: 2 Glasses of wine, 2 Cans of beer, 4 Shots of liquor per week    Comment: wine   Drug use: No   Sexual activity: Yes    Birth control/protection: None  Other Topics Concern   Not on file  Social History Narrative   Married, no children, original from [place]   Social Drivers of Health   Financial Resource Strain: Low Risk  (02/24/2024)   Overall Financial Resource Strain (CARDIA)    Difficulty of Paying Living Expenses: Not hard at all  Food Insecurity: No Food Insecurity (02/24/2024)   Hunger Vital Sign    Worried About Running Out of Food in the Last Year: Never true    Ran Out of Food in the Last Year: Never true  Transportation Needs: No Transportation Needs (02/24/2024)   PRAPARE - Administrator, Civil Service (Medical): No    Lack of Transportation (Non-Medical): No  Physical Activity: Insufficiently Active (02/24/2024)   Exercise Vital Sign    Days of Exercise per Week: 3 days    Minutes of Exercise per Session: 40 min  Stress: No Stress Concern Present (02/24/2024)   [employer] of Occupational Health - Occupational Stress Questionnaire    Feeling of Stress : Only a little  Social Connections: Unknown (02/24/2024)   Social Connection and Isolation Panel [NHANES]    Frequency of Communication with Friends and Family: Patient declined    Frequency of Social Gatherings with Friends  and Family: More than three times a week  Attends Religious Services: Patient declined    Active Member of Clubs or Organizations: Yes    Attends Banker Meetings: More than 4 times per year    Marital Status: Married  Catering manager Violence: Not At Risk (12/25/2023)   Humiliation, Afraid, Rape, and Kick questionnaire    Fear of Current or Ex-Partner: No    Emotionally Abused: No    Physically Abused: No    Sexually Abused: No    Review of Systems  Constitutional:  Negative for chills, diaphoresis, fever and malaise/fatigue.  Respiratory:  Negative for cough, shortness of breath and wheezing.   Cardiovascular:  Negative for chest pain, palpitations and leg swelling.  Gastrointestinal: Negative.   Musculoskeletal:  Positive for joint pain (shoulders).  Neurological: Negative.   Psychiatric/Behavioral: Negative.          Objective    BP 128/71 (BP Location: Left Arm, Patient Position: Sitting, Cuff Size: Large)   Pulse 61   Temp 98.1 F (36.7 C) (Oral)   Resp 16   Ht 6' 3.08" (1.907 m)   Wt 207 lb 9.6 oz (94.2 kg)   SpO2 98%   BMI 25.89 kg/m   Physical Exam Vitals and nursing note reviewed.  Constitutional:      General: He is awake. He is not in acute distress.    Appearance: He is well-developed and well-groomed. He is not ill-appearing or toxic-appearing.  HENT:     Head: Normocephalic.     Right Ear: Hearing and external ear normal.     Left Ear: Hearing and external ear normal.  Eyes:     General: Lids are normal.     Extraocular Movements: Extraocular movements intact.     Conjunctiva/sclera: Conjunctivae normal.  Neck:     Thyroid: No thyromegaly.     Vascular: No carotid bruit.  Cardiovascular:     Rate and Rhythm: Normal rate and regular rhythm.     Heart sounds: Normal heart sounds. No murmur heard.    No gallop.  Pulmonary:     Effort: No accessory muscle usage or respiratory distress.     Breath sounds: Normal breath sounds.   Abdominal:     General: Bowel sounds are normal. There is no distension.     Palpations: Abdomen is soft.     Tenderness: There is no abdominal tenderness.  Musculoskeletal:     Cervical back: Full passive range of motion without pain.     Right lower leg: No edema.     Left lower leg: No edema.  Lymphadenopathy:     Cervical: No cervical adenopathy.  Skin:    General: Skin is warm.     Capillary Refill: Capillary refill takes less than 2 seconds.  Neurological:     Mental Status: He is alert and oriented to person, place, and time.     Deep Tendon Reflexes: Reflexes are normal and symmetric.     Reflex Scores:      Brachioradialis reflexes are 2+ on the right side and 2+ on the left side.      Patellar reflexes are 2+ on the right side and 2+ on the left side. Psychiatric:        Attention and Perception: Attention normal.        Mood and Affect: Mood normal.        Speech: Speech normal.        Behavior: Behavior normal. Behavior is cooperative.        Thought Content:  Thought content normal.    Last CBC Lab Results  Component Value Date   WBC 5.7 12/25/2023   HGB 13.5 12/25/2023   HCT 39.9 12/25/2023   MCV 102.6 (H) 12/25/2023   MCH 34.7 (H) 12/25/2023   RDW 13.9 12/25/2023   PLT 270 12/25/2023   Last metabolic panel Lab Results  Component Value Date   GLUCOSE 95 12/10/2023   NA 139 12/10/2023   K 4.8 12/10/2023   CL 104 12/10/2023   CO2 29 12/10/2023   BUN 16 12/10/2023   CREATININE 0.84 12/10/2023   GFR 88.21 12/10/2023   CALCIUM 9.4 12/10/2023   PROT 6.8 12/10/2023   ALBUMIN 4.5 12/10/2023   LABGLOB 2.3 12/25/2023   AGRATIO 2.0 (H) 12/25/2023   BILITOT 0.6 12/10/2023   ALKPHOS 76 12/10/2023   AST 20 12/10/2023   ALT 18 12/10/2023   ANIONGAP 15 01/31/2020   Last lipids Lab Results  Component Value Date   CHOL 156 12/10/2023   HDL 41.00 12/10/2023   LDLCALC 87 12/10/2023   TRIG 140.0 12/10/2023   CHOLHDL 4 12/10/2023   Last hemoglobin  A1c Lab Results  Component Value Date   HGBA1C 5.4 11/24/2019   Last thyroid functions Lab Results  Component Value Date   TSH 1.66 11/26/2020        Assessment & Plan:   Problem List Items Addressed This Visit       Musculoskeletal and Integument   Bilateral rotator cuff dysfunction   Ongoing issue, history of injections about 4 years ago with ortho.  Also use NTG patches at time.  There is note of a possible small supraspinatus tear to left shoulder.  Overall steroid injections worked well for him and both shoulders were done on same day.  He would like to have this done again before traveling to [place].  Will plan to schedule beginning of July for steroid injections.  Consider PT or return to ortho if worsening.  He is very active at baseline.        Other   Macrocytosis without anemia   History of this, had full work-up with hematology which was overall reassuring and to return to them as needed.      Mixed hyperlipidemia - Primary   Chronic, stable.  Continue current medication regimen and adjust as needed.  Plan on labs in December.  Refills as needed.      Other Visit Diagnoses       Encounter to establish care       New patient to clinic, introduced to NP role and practice setting.       Return in about 9 months (around 12/10/2024) for Annual Physical -- after 12/09/24 and July 1st for shoulder injections .   Marjie Skiff, NP

## 2024-05-08 ENCOUNTER — Other Ambulatory Visit: Payer: Self-pay | Admitting: Internal Medicine

## 2024-05-21 ENCOUNTER — Other Ambulatory Visit: Payer: Self-pay | Admitting: Internal Medicine

## 2024-05-28 ENCOUNTER — Telehealth: Payer: Self-pay | Admitting: Nurse Practitioner

## 2024-05-28 NOTE — Telephone Encounter (Signed)
 Copied from CRM [id number]. Topic: Medicare AWV >> May 28, 2024  1:26 PM Juliana Ocean wrote: Reason for CRM: LVM 05/28/2024 to schedule AWV. Please schedule Virtual or Telehealth visits ONLY  Rosalee Collins; Care Guide Ambulatory Clinical Support [facility] Direct Dial: [phone]

## 2024-06-21 NOTE — Patient Instructions (Signed)
 Be Involved in Caring For Your Health:  Taking Medications When medications are taken as directed, they can greatly improve your health. But if they are not taken as prescribed, they may not work. In some cases, not taking them correctly can be harmful. To help ensure your treatment remains effective and safe, understand your medications and how to take them. Bring your medications to each visit for review by your provider.  Your lab results, notes, and after visit summary will be available on My Chart. We strongly encourage you to use this feature. If lab results are abnormal the clinic will contact you with the appropriate steps. If the clinic does not contact you assume the results are satisfactory. You can always view your results on My Chart. If you have questions regarding your health or results, please contact the clinic during office hours. You can also ask questions on My Chart.  We at [facility] are grateful that you chose us  to provide your care. We strive to provide evidence-based and compassionate care and are always looking for feedback. If you get a survey from the clinic please complete this so we can hear your opinions.  Shoulder Pain Many things can cause shoulder pain, including: An injury. Moving the shoulder in the same way again and again (overuse). Joint pain (arthritis). Pain can come from: Swelling and irritation (inflammation) of any part of the shoulder. An injury to: The shoulder joint. Tissues that connect muscle to bone (tendons). Tissues that connect bones to each other (ligaments). Bones. Follow these instructions at home: Watch for changes in your symptoms. Let your doctor know about them. Follow these instructions to help with your pain. If you have a sling that can be taken off: Wear the sling as told by your doctor. Take it off only as told by your doctor. Check the skin around the sling every day. Tell your doctor if you see  problems. Loosen the sling if your fingers: Tingle. Become numb. Become cold. Keep the sling clean. If the sling is not waterproof: Do not let it get wet. Take the sling off when you shower or bathe. Managing pain, stiffness, and swelling  If told, put ice on the painful area. Put ice in a plastic bag. Place a towel between your skin and the bag. Leave the ice on for 20 minutes, 2-3 times a day. Stop putting ice on if it does not help with the pain. If your skin turns bright red, take off the ice right away to prevent skin damage. The risk of damage is higher if you cannot feel pain, heat, or cold. Squeeze a soft ball or a foam pad as much as possible. This prevents swelling in the shoulder. It also helps to strengthen the arm. General instructions Take over-the-counter and prescription medicines only as told by your doctor. Keep all follow-up visits. This will help you avoid any type of permanent shoulder problems. Contact a doctor if: Your pain gets worse. Medicine does not help your pain. You have new pain in your arm, hand, or fingers. You loosen your sling and your arm, hand, or fingers: Tingle. Are numb. Are swollen. Get help right away if: Your arm, hand, or fingers turn white or blue. This information is not intended to replace advice given to you by your health care provider. Make sure you discuss any questions you have with your health care provider. Document Revised: 07/14/2022 Document Reviewed: 07/14/2022 Elsevier Patient Education  2024 [employer].

## 2024-06-24 ENCOUNTER — Encounter: Payer: Self-pay | Admitting: Nurse Practitioner

## 2024-06-24 ENCOUNTER — Ambulatory Visit: Admitting: Nurse Practitioner

## 2024-06-24 VITALS — BP 129/71 | HR 57 | Temp 97.5°F | Ht 75.1 in | Wt 205.2 lb

## 2024-06-24 DIAGNOSIS — M67912 Unspecified disorder of synovium and tendon, left shoulder: Secondary | ICD-10-CM

## 2024-06-24 DIAGNOSIS — M67911 Unspecified disorder of synovium and tendon, right shoulder: Secondary | ICD-10-CM

## 2024-06-24 NOTE — Progress Notes (Signed)
 BP 129/71   Pulse (!) 57   Temp (!) 97.5 F (36.4 C) (Oral)   Ht 6' 3.1 (1.908 m)   Wt 205 lb 3.2 oz (93.1 kg)   SpO2 98%   BMI 25.58 kg/m    Subjective:    Patient ID: Christopher Santos, male    DOB: 11/17/1951, 73 y.o.   MRN: [id number]  HPI: Christopher Santos is a 73 y.o. male  Chief Complaint  Patient presents with   Shoulder Pain   SHOULDER PAIN Has rotator cuff issues both sides. Had steroid injections 4 years ago, these offered benefit for a few weeks -- obtained both sides on same day. Tries to stay active. There is note of a possible small supraspinatus tear to left shoulder. Overall steroid injections worked well for him and both shoulders were done on same day in past. Is traveling soon and would like injections today, Duration: chronic Involved shoulder: bilateral Mechanism of injury: unknown Location: lateral and anterior Onset:gradual Severity: 2/10  Quality:  dull, aching, and throbbing Frequency: intermittent Radiation: no Aggravating factors: lifting, movement, and throwing  Alleviating factors: rest and steroid shots Status: stable Treatments attempted: rest, APAP, and ibuprofen and steroid shots + PT Relief with NSAIDs?:  mild Weakness: no Numbness: no Decreased grip strength: no Redness: no Swelling: no Bruising: no Fevers: no   Relevant past medical, surgical, family and social history reviewed and updated as indicated. Interim medical history since our last visit reviewed. Allergies and medications reviewed and updated.  Review of Systems  Constitutional:  Negative for activity change, diaphoresis, fatigue and fever.  Respiratory:  Negative for cough, chest tightness, shortness of breath and wheezing.   Cardiovascular:  Negative for chest pain, palpitations and leg swelling.  Gastrointestinal: Negative.   Musculoskeletal:  Positive for arthralgias.  Neurological: Negative.   Psychiatric/Behavioral: Negative.     Per HPI unless  specifically indicated above     Objective:    BP 129/71   Pulse (!) 57   Temp (!) 97.5 F (36.4 C) (Oral)   Ht 6' 3.1 (1.908 m)   Wt 205 lb 3.2 oz (93.1 kg)   SpO2 98%   BMI 25.58 kg/m   Wt Readings from Last 3 Encounters:  06/24/24 205 lb 3.2 oz (93.1 kg)  02/28/24 207 lb 9.6 oz (94.2 kg)  12/25/23 207 lb (93.9 kg)    Physical Exam Vitals and nursing note reviewed.  Constitutional:      General: He is awake. He is not in acute distress.    Appearance: He is well-developed and well-groomed. He is not ill-appearing or toxic-appearing.  HENT:     Head: Normocephalic.     Right Ear: Hearing and external ear normal.     Left Ear: Hearing and external ear normal.   Eyes:     General: Lids are normal.     Extraocular Movements: Extraocular movements intact.     Conjunctiva/sclera: Conjunctivae normal.   Neck:     Thyroid : No thyromegaly.     Vascular: No carotid bruit.   Cardiovascular:     Rate and Rhythm: Regular rhythm. Bradycardia present.     Heart sounds: Normal heart sounds. No murmur heard.    No gallop.  Pulmonary:     Effort: No accessory muscle usage or respiratory distress.     Breath sounds: Normal breath sounds.  Abdominal:     General: Bowel sounds are normal. There is no distension.     Palpations: Abdomen  is soft.     Tenderness: There is no abdominal tenderness.   Musculoskeletal:     Right shoulder: Tenderness (lateral aspect) and crepitus present. No swelling. Normal range of motion. Normal strength. Normal pulse.     Left shoulder: Crepitus present. No swelling or tenderness. Normal range of motion. Normal strength. Normal pulse.     Cervical back: Full passive range of motion without pain.     Right lower leg: No edema.     Left lower leg: No edema.  Lymphadenopathy:     Cervical: No cervical adenopathy.   Skin:    General: Skin is warm.     Capillary Refill: Capillary refill takes less than 2 seconds.   Neurological:     Mental  Status: He is alert and oriented to person, place, and time.     Deep Tendon Reflexes: Reflexes are normal and symmetric.     Reflex Scores:      Brachioradialis reflexes are 2+ on the right side and 2+ on the left side.      Patellar reflexes are 2+ on the right side and 2+ on the left side.  Psychiatric:        Attention and Perception: Attention normal.        Mood and Affect: Mood normal.        Speech: Speech normal.        Behavior: Behavior normal. Behavior is cooperative.        Thought Content: Thought content normal.   STEROID INJECTIONS Procedure: Bilateral Shoulder Intraarticular Steroid Injections Description: After verbal consent and patient education provided on procedure, area prepped and draped using semi-sterile technique. Using a posterior approach to both shoulders, a mixture of 4 cc of  1% Lidocaine  & 1 cc of Kenalog 40 was injected into shoulder joints.  A bandage was then placed over the injection sites. Complications:  none, tolerated procedure well Post Procedure Instructions: To the ER if any symptoms of erythema or swelling.   Follow Up: PRN   Results for orders placed or performed in visit on 12/25/23  Intelli[facility] Myeloid   Collection Time: 12/25/23  2:07 PM  Result Value Ref Range   Specimen Type Whole Blood    Clinical Indication Comment    RESULT SUMMARY Comment    METHODOLOGY Comment    REFERENCES Comment    DIRECTOR REVIEW Comment   Protein electrophoresis, serum   Collection Time: 12/25/23  2:07 PM  Result Value Ref Range   Total Protein ELP 6.8 6.0 - 8.5 g/dL   Albumin ELP 4.5 (H) 2.9 - 4.4 g/dL   Joeyj-8-Honalopw 0.2 0.0 - 0.4 g/dL   Joeyj-7-Honalopw 0.5 0.4 - 1.0 g/dL   Beta Globulin 0.9 0.7 - 1.3 g/dL   Gamma Globulin 0.8 0.4 - 1.8 g/dL   M-Spike, % Not Observed Not Observed g/dL   SPE Interp. Comment    Comment Comment    Globulin, Total 2.3 2.2 - 3.9 g/dL   A/G Ratio 2.0 (H) 0.7 - 1.7  Hgb Fractionation Cascade   Collection Time:  12/25/23  2:07 PM  Result Value Ref Range   Hgb F 0.0 0.0 - 2.0 %   Hgb A 97.4 96.4 - 98.8 %   Hgb A2 2.6 1.8 - 3.2 %   Hgb S 0.0 0.0 %   Interpretation, Hgb Fract Comment   Iron and TIBC   Collection Time: 12/25/23  2:07 PM  Result Value Ref Range   Iron 96 45 -  182 ug/dL   TIBC 634 749 - 549 ug/dL   Saturation Ratios 26 17.9 - 39.5 %   UIBC 269 ug/dL  Ferritin   Collection Time: 12/25/23  2:07 PM  Result Value Ref Range   Ferritin 54 24 - 336 ng/mL  CBC (Cancer Center Only)   Collection Time: 12/25/23  2:07 PM  Result Value Ref Range   WBC Count 5.7 4.0 - 10.5 K/uL   RBC 3.89 (L) 4.22 - 5.81 MIL/uL   Hemoglobin 13.5 13.0 - 17.0 g/dL   HCT 60.0 60.9 - 47.9 %   MCV 102.6 (H) 80.0 - 100.0 fL   MCH 34.7 (H) 26.0 - 34.0 pg   MCHC 33.8 30.0 - 36.0 g/dL   RDW 86.0 88.4 - 84.4 %   Platelet Count 270 150 - 400 K/uL   nRBC 0.0 0.0 - 0.2 %      Assessment & Plan:   Problem List Items Addressed This Visit       Musculoskeletal and Integument   Bilateral rotator cuff dysfunction - Primary   Ongoing issue, history of injections about 4 years ago with ortho.  Also uses NTG patches at time.  There is note of a possible small supraspinatus tear to left shoulder on past notes.  Overall steroid injections worked well for him and both shoulders were done on same day in past.  Injections performed similarly today and tolerated well.  Educated on procedure and verbal consent given prior.  He was instructed on reasons to go to ER.  Consider PT or return to ortho if worsening.  He is very active at baseline.       Time: 25 minutes, >50% spent counseling/or care coordination   Follow up plan: Return for as scheduled in December.

## 2024-06-24 NOTE — Assessment & Plan Note (Signed)
 Ongoing issue, history of injections about 4 years ago with ortho.  Also uses NTG patches at time.  There is note of a possible small supraspinatus tear to left shoulder on past notes.  Overall steroid injections worked well for him and both shoulders were done on same day in past.  Injections performed similarly today and tolerated well.  Educated on procedure and verbal consent given prior.  He was instructed on reasons to go to ER.  Consider PT or return to ortho if worsening.  He is very active at baseline.

## 2024-08-01 ENCOUNTER — Telehealth: Payer: Self-pay | Admitting: Nurse Practitioner

## 2024-08-01 NOTE — Telephone Encounter (Signed)
 Copied from CRM #[id number]. Topic: Medicare AWV >> Aug 01, 2024  9:45 AM Nathanel DEL wrote: Reason for CRM: Called 08/01/2024 LVM to reschedule AWV appt 08/07/24 due [facility] on vacation. New AWV appt date 10/02/2024 at 840am Please call to [phone] r/s to confirm date change   Nathanel Paschal; Care Guide Ambulatory Clinical Support [place] [facility] Direct Dial: [phone]

## 2024-08-06 DIAGNOSIS — L814 Other melanin hyperpigmentation: Secondary | ICD-10-CM | POA: Diagnosis not present

## 2024-08-06 DIAGNOSIS — D225 Melanocytic nevi of trunk: Secondary | ICD-10-CM | POA: Diagnosis not present

## 2024-08-06 DIAGNOSIS — L821 Other seborrheic keratosis: Secondary | ICD-10-CM | POA: Diagnosis not present

## 2024-08-06 DIAGNOSIS — B078 Other viral warts: Secondary | ICD-10-CM | POA: Diagnosis not present

## 2024-08-07 ENCOUNTER — Ambulatory Visit

## 2024-10-02 ENCOUNTER — Ambulatory Visit (INDEPENDENT_AMBULATORY_CARE_PROVIDER_SITE_OTHER): Admitting: Emergency Medicine

## 2024-10-02 VITALS — Ht 75.0 in | Wt 200.0 lb

## 2024-10-02 DIAGNOSIS — Z0000 Encounter for general adult medical examination without abnormal findings: Secondary | ICD-10-CM | POA: Diagnosis not present

## 2024-10-02 NOTE — Progress Notes (Signed)
 Subjective:   Christopher Santos is a 73 y.o. who presents for a Medicare Wellness preventive visit.  As a reminder, Annual Wellness Visits don't include a physical exam, and some assessments may be limited, especially if this visit is performed virtually. We may recommend an in-person follow-up visit with your provider if needed.  Visit Complete: Virtual I connected with  Christopher Santos on 10/02/24 by a audio enabled telemedicine application and verified that I am speaking with the correct person using two identifiers.  Patient Location: Home  Provider Location: Home Office  I discussed the limitations of evaluation and management by telemedicine. The patient expressed understanding and agreed to proceed.  Vital Signs: Because this visit was a virtual/telehealth visit, some criteria may be missing or patient reported. Any vitals not documented were not able to be obtained and vitals that have been documented are patient reported.  VideoDeclined- This patient declined Librarian, academic. Therefore the visit was completed with audio only.  Persons Participating in Visit: Patient.  AWV Questionnaire: Yes: Patient Medicare AWV questionnaire was completed by the patient on 09/29/24; I have confirmed that all information answered by patient is correct and no changes since this date.  Cardiac Risk Factors include: advanced age (>36men, >30 women);male gender;dyslipidemia     Objective:    Today's Vitals   10/02/24 0844  Weight: 200 lb (90.7 kg)  Height: 6' 3 (1.905 m)   Body mass index is 25 kg/m.     10/02/2024    8:55 AM 12/25/2023    1:29 PM 01/26/2022    6:30 AM 01/17/2022   12:16 PM 08/11/2020    4:27 PM 01/26/2020   12:38 PM 01/26/2020   10:17 AM  Advanced Directives  Does Patient Have a Medical Advance Directive? Yes No No No No No No  Type of Estate agent of [place];Living will        Does patient want to make changes to  medical advance directive? No - Patient declined        Copy of Healthcare Power of Attorney in Chart? No - copy requested        Would patient like information on creating a medical advance directive?  No - Patient declined Yes (MAU/Ambulatory/Procedural Areas - Information given) No - Patient declined No - Patient declined No - Patient declined     Current Medications (verified) Outpatient Encounter Medications as of 10/02/2024  Medication Sig   Ascorbic Acid  (VITAMIN C) 1000 MG tablet Take 1,000 mg by mouth daily.   aspirin  81 MG tablet Take 81 mg by mouth at bedtime.    b complex vitamins capsule Take 1 capsule by mouth daily.   cyanocobalamin  (VITAMIN B12) 500 MCG tablet Take 500 mcg by mouth daily.   ezetimibe  (ZETIA ) 10 MG tablet TAKE 1 TABLET BY MOUTH EVERY DAY   L-Lysine 500 MG CAPS Take by mouth.   magnesium oxide (MAG-OX) 400 (240 Mg) MG tablet Take 400 mg by mouth daily.   Omega-3 Fatty Acids (OMEGA 3 500 PO) Take by mouth.   pravastatin  (PRAVACHOL ) 80 MG tablet TAKE 1 TABLET BY MOUTH EVERYDAY AT BEDTIME   vitamin E 200 UNIT capsule Take 200 Units by mouth daily.   No facility-administered encounter medications on file as of 10/02/2024.    Allergies (verified) Patient has no known allergies.   History: Past Medical History:  Diagnosis Date   Allergy    Blood transfusion without reported diagnosis  01-26-2020   COVID-19 virus infection 01/26/2020   Hyperlipemia    Hypertension    on occasion but not treated    Past Surgical History:  Procedure Laterality Date   COLONOSCOPY     HERNIA REPAIR  2023   INGUINAL HERNIA REPAIR Right 01/26/2022   Procedure: OPEN RIGHT INGUINAL HERNIA REPAIR WITH MESH;  Surgeon: Vernetta Berg, MD;  Location: [facility];  Service: General;  Laterality: Right;   POLYPECTOMY     TONSILLECTOMY     at age 74   Family History  Problem Relation Age of Onset   CAD Father 64       MI at 77   Rheum arthritis Brother     Diabetes Neg Hx    Colon cancer Neg Hx    Prostate cancer Neg Hx    Colon polyps Neg Hx    Esophageal cancer Neg Hx    Rectal cancer Neg Hx    Stomach cancer Neg Hx    Social History   Socioeconomic History   Marital status: Married    Spouse name: Earnie   Number of children: 0   Years of education: Not on file   Highest education level: Not on file  Occupational History   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 , [employer] (used to be Advertising account planner)  Tobacco Use   Smoking status: Former    Current packs/day: 0.00    Average packs/day: 0.5 packs/day for 18.0 years (9.0 ttl pk-yrs)    Types: Cigarettes    Start date: 58    Quit date: 1993    Years since quitting: 32.7   Smokeless tobacco: Never   Tobacco comments:    quit 1993, no heavy use   Vaping Use   Vaping status: Never Used  Substance and Sexual Activity   Alcohol use: Yes    Alcohol/week: 4.0 standard drinks of alcohol    Types: 2 Glasses of wine, 2 Shots of liquor per week    Comment: 1 glass of wine or 1 shot of liquor 2 times per week   Drug use: No   Sexual activity: Yes    Birth control/protection: None  Other Topics Concern   Not on file  Social History Narrative   Married, no children, original from [place]   Social Drivers of Health   Financial Resource Strain: Low Risk  (10/02/2024)   Overall Financial Resource Strain (CARDIA)    Difficulty of Paying Living Expenses: Not hard at all  Food Insecurity: No Food Insecurity (10/02/2024)   Hunger Vital Sign    Worried About Running Out of Food in the Last Year: Never true    Ran Out of Food in the Last Year: Never true  Transportation Needs: No Transportation Needs (10/02/2024)   PRAPARE - Administrator, Civil Service (Medical): No    Lack of Transportation (Non-Medical): No  Physical Activity: Insufficiently Active (10/02/2024)   Exercise Vital Sign    Days of Exercise per Week: 3 days    Minutes of Exercise per Session: 30 min  Stress: No Stress  Concern Present (10/02/2024)   [employer] of Occupational Health - Occupational Stress Questionnaire    Feeling of Stress: Not at all  Social Connections: Moderately Isolated (10/02/2024)   Social Connection and Isolation Panel    Frequency of Communication with Friends and Family: More than three times a week    Frequency of Social Gatherings with Friends and Family: More than three times a  week    Attends Religious Services: Never    Active Member of Clubs or Organizations: No    Attends Engineer, structural: Never    Marital Status: Married    Tobacco Counseling Counseling given: Not Answered Tobacco comments: quit 1993, no heavy use     Clinical Intake:  Pre-visit preparation completed: Yes  Pain : No/denies pain     BMI - recorded: 25 Nutritional Status: BMI 25 -29 Overweight Nutritional Risks: None Diabetes: No  Lab Results  Component Value Date   HGBA1C 5.4 11/24/2019     How often do you need to have someone help you when you read instructions, pamphlets, or other written materials from your doctor or pharmacy?: 1 - Never  Interpreter Needed?: No  Information entered by :: Vina Ned, CMA   Activities of Daily Living     10/02/2024    8:46 AM 09/29/2024   10:34 AM  In your present state of health, do you have any difficulty performing the following activities:  Hearing? 0 0  Vision? 0 0  Difficulty concentrating or making decisions? 0 0  Walking or climbing stairs? 0   Dressing or bathing? 0 0  Doing errands, shopping? 0 0  Preparing Food and eating ? N N  Using the Toilet? N N  In the past six months, have you accidently leaked urine? N N  Do you have problems with loss of bowel control? N N  Managing your Medications? N N  Managing your Finances? N N  Housekeeping or managing your Housekeeping? N N    Patient Care Team: Cannady, Jolene T, NP as PCP - General (Nurse Practitioner) Abran Norleen SAILOR, MD as Consulting Physician  (Gastroenterology) Cleotilde Elspeth CROME, OD (Optometry) Elnor Rome BROCKS, MD as Referring Physician (Dermatology)  I have updated your Care Teams any recent Medical Services you may have received from other providers in the past year.     Assessment:   This is a routine wellness examination for Christopher Santos.  Hearing/Vision screen Hearing Screening - Comments:: Denies hearing loss  Vision Screening - Comments:: Gets routine eye exams, Dr. Elspeth Cleotilde, [facility]   Goals Addressed             This Visit's Progress    Patient Stated       Maintain current health and activity level       Depression Screen     10/02/2024    8:53 AM 06/24/2024    8:46 AM 02/28/2024    9:18 AM 12/25/2023    1:38 PM 12/10/2023    9:44 AM 12/06/2022    8:47 AM 11/30/2021    7:51 AM  PHQ 2/9 Scores  PHQ - 2 Score 0 0 0 0 0 0 0  PHQ- 9 Score 0 0         Fall Risk     10/02/2024    8:59 AM 09/29/2024   10:34 AM 06/24/2024    8:46 AM 02/28/2024    9:17 AM 12/10/2023    9:43 AM  Fall Risk   Falls in the past year? 0 0 0 0 0  Number falls in past yr: 0  0 0 0  Injury with Fall? 0  0 0 0  Risk for fall due to : No Fall Risks  No Fall Risks No Fall Risks   Follow up Falls evaluation completed  Falls evaluation completed Falls evaluation completed Falls evaluation completed;Education provided    MEDICARE RISK AT  HOME:  Medicare Risk at Home Any stairs in or around the home?: No If so, are there any without handrails?: No Home free of loose throw rugs in walkways, pet beds, electrical cords, etc?: No Adequate lighting in your home to reduce risk of falls?: Yes Life alert?: No Use of a cane, walker or w/c?: No Grab bars in the bathroom?: Yes Shower chair or bench in shower?: No Elevated toilet seat or a handicapped toilet?: No  TIMED UP AND GO:  Was the test performed?  No  Cognitive Function: 6CIT completed        10/02/2024    8:59 AM  6CIT Screen  What Year? 0 points  What month? 0 points   What time? 0 points  Count back from 20 0 points  Months in reverse 0 points  Repeat phrase 0 points  Total Score 0 points    Immunizations Immunization History  Administered Date(s) Administered    sv, Bivalent, Protein Subunit Rsvpref,pf (Abrysvo) 03/04/2024   Fluad Quad(high Dose 65+) 08/29/2020, 11/14/2022   INFLUENZA, HIGH DOSE SEASONAL PF 10/05/2021, 08/29/2023, 09/22/2024   Influenza,inj,Quad PF,6+ Mos 08/08/2019   PFIZER Comirnaty (Gray Top)Covid-19 Tri-Sucrose Vaccine 07/01/2021   PFIZER(Purple Top)SARS-COV-2 Vaccination 04/29/2020, 05/25/2020, 12/07/2020   Pfizer Covid-19 Vaccine Bivalent Booster 67yrs & up 10/05/2021   Pfizer(Comirnaty )Fall Seasonal Vaccine 12 years and older 12/06/2022, 09/22/2024   Pneumococcal Conjugate-13 11/15/2018   Pneumococcal Polysaccharide-23 11/24/2019   Td 04/24/2002   Tdap 12/26/2011, 11/30/2021   Zoster Recombinant(Shingrix ) 11/15/2018, 02/11/2019   Zoster, Live 07/24/2013    Screening Tests Health Maintenance  Topic Date Due   COVID-19 Vaccine (8 - Pfizer risk 2025-26 season) 03/22/2025   Colonoscopy  03/31/2025   Medicare Annual Wellness (AWV)  10/02/2025   DTaP/Tdap/Td (4 - Td or Tdap) 12/01/2031   Pneumococcal Vaccine: 50+ Years  Completed   Influenza Vaccine  Completed   Hepatitis C Screening  Completed   Zoster Vaccines- Shingrix   Completed   Meningococcal B Vaccine  Aged Out    Health Maintenance Items Addressed: See Nurse Notes at the end of this note  Additional Screening:  Vision Screening: Recommended annual ophthalmology exams for early detection of glaucoma and other disorders of the eye. Is the patient up to date with their annual eye exam?  Yes  Who is the provider or what is the name of the office in which the patient attends annual eye exams? Dr. Elspeth [facility]  Dental Screening: Recommended annual dental exams for proper oral hygiene  Community Resource Referral / Chronic Care Management: CRR  required this visit?  No   CCM required this visit?  No   Plan:    I have personally reviewed and noted the following in the patient's chart:   Medical and social history Use of alcohol, tobacco or illicit drugs  Current medications and supplements including opioid prescriptions. Patient is not currently taking opioid prescriptions. Functional ability and status Nutritional status Physical activity Advanced directives List of other physicians Hospitalizations, surgeries, and ER visits in previous 12 months Vitals Screenings to include cognitive, depression, and falls Referrals and appointments  In addition, I have reviewed and discussed with patient certain preventive protocols, quality metrics, and best practice recommendations. A written personalized care plan for preventive services as well as general preventive health recommendations were provided to patient.   Vina Ned, CMA   10/02/2024   After Visit Summary: (MyChart) Due to this being a telephonic visit, the after visit summary with patients personalized  plan was offered to patient via MyChart   Notes: Nothing significant to report at this time.

## 2024-10-02 NOTE — Patient Instructions (Signed)
 [name],  Thank you for taking the time for your Medicare Wellness Visit. I appreciate your continued commitment to your health goals. Please review the care plan we discussed, and feel free to reach out if I can assist you further.  Medicare recommends these wellness visits once per year to help you and your care team stay ahead of potential health issues. These visits are designed to focus on prevention, allowing your provider to concentrate on managing your acute and chronic conditions during your regular appointments.  Please note that Annual Wellness Visits do not include a physical exam. Some assessments may be limited, especially if the visit was conducted virtually. If needed, we may recommend a separate in-person follow-up with your provider.  Ongoing Care Seeing your primary care provider every 3 to 6 months helps us  monitor your health and provide consistent, personalized care.    Referrals If a referral was made during today's visit and you haven't received any updates within two weeks, please contact the referred provider directly to check on the status.  Recommended Screenings: Call to schedule a routine eye exam at your convenience.  Health Maintenance  Topic Date Due   COVID-19 Vaccine (8 - Pfizer risk 2025-26 season) 03/22/2025   Colon Cancer Screening  03/31/2025   Medicare Annual Wellness Visit  10/02/2025   DTaP/Tdap/Td vaccine (4 - Td or Tdap) 12/01/2031   Pneumococcal Vaccine for age over 79  Completed   Flu Shot  Completed   Hepatitis C Screening  Completed   Zoster (Shingles) Vaccine  Completed   Meningitis B Vaccine  Aged Out       10/02/2024    8:55 AM  Advanced Directives  Does Patient Have a Medical Advance Directive? Yes  Type of Estate agent of [place];Living will  Does patient want to make changes to medical advance directive? No - Patient declined  Copy of Healthcare Power of Attorney in Chart? No - copy requested    Advance Care Planning is important because it: Ensures you receive medical care that aligns with your values, goals, and preferences. Provides guidance to your family and loved ones, reducing the emotional burden of decision-making during critical moments.  Vision: Annual vision screenings are recommended for early detection of glaucoma, cataracts, and diabetic retinopathy. These exams can also reveal signs of chronic conditions such as diabetes and high blood pressure.  Dental: Annual dental screenings help detect early signs of oral cancer, gum disease, and other conditions linked to overall health, including heart disease and diabetes.  Please see the attached documents for additional preventive care recommendations.   Fall Prevention in the Home, Adult Falls can cause injuries and affect people of all ages. There are many simple things that you can do to make your home safe and to help prevent falls. If you need it, ask for help making these changes. What actions can I take to prevent falls? General information Use good lighting in all rooms. Make sure to: Replace any light bulbs that burn out. Turn on lights if it is dark and use night-lights. Keep items that you use often in easy-to-reach places. Lower the shelves around your home if needed. Move furniture so that there are clear paths around it. Do not keep throw rugs or other things on the floor that can make you trip. If any of your floors are uneven, fix them. Add color or contrast paint or tape to clearly mark and help you see: Grab bars or handrails. First and  last steps of staircases. Where the edge of each step is. If you use a ladder or stepladder: Make sure that it is fully opened. Do not climb a closed ladder. Make sure the sides of the ladder are locked in place. Have someone hold the ladder while you use it. Know where your pets are as you move through your home. What can I do in the bathroom?     Keep the  floor dry. Clean up any water that is on the floor right away. Remove soap buildup in the bathtub or shower. Buildup makes bathtubs and showers slippery. Use non-skid [name] or decals on the floor of the bathtub or shower. Attach bath [name] securely with double-sided, non-slip rug tape. If you need to sit down while you are in the shower, use a non-slip stool. Install grab bars by the toilet and in the bathtub and shower. Do not use towel bars as grab bars. What can I do in the bedroom? Make sure that you have a light by your bed that is easy to reach. Do not use any sheets or blankets on your bed that hang to the floor. Have a firm bench or chair with side arms that you can use for support when you get dressed. What can I do in the kitchen? Clean up any spills right away. If you need to reach something above you, use a sturdy step stool that has a grab bar. Keep electrical cables out of the way. Do not use floor polish or wax that makes floors slippery. What can I do with my stairs? Do not leave anything on the stairs. Make sure that you have a light switch at the top and the bottom of the stairs. Have them installed if you do not have them. Make sure that there are handrails on both sides of the stairs. Fix handrails that are broken or loose. Make sure that handrails are as long as the staircases. Install non-slip stair treads on all stairs in your home if they do not have carpet. Avoid having throw rugs at the top or bottom of stairs, or secure the rugs with carpet tape to prevent them from moving. Choose a carpet design that does not hide the edge of steps on the stairs. Make sure that carpet is firmly attached to the stairs. Fix any carpet that is loose or worn. What can I do on the outside of my home? Use bright outdoor lighting. Repair the edges of walkways and driveways and fix any cracks. Clear paths of anything that can make you trip, such as tools or rocks. Add color or contrast  paint or tape to clearly mark and help you see high doorway thresholds. Trim any bushes or trees on the main path into your home. Check that handrails are securely fastened and in good repair. Both sides of all steps should have handrails. Install guardrails along the edges of any raised decks or porches. Have leaves, snow, and ice cleared regularly. Use sand, salt, or ice melt on walkways during winter months if you live where there is ice and snow. In the garage, clean up any spills right away, including grease or oil spills. What other actions can I take? Review your medicines with your health care provider. Some medicines can make you confused or feel dizzy. This can increase your chance of falling. Wear closed-toe shoes that fit well and support your feet. Wear shoes that have rubber soles and low heels. Use a cane, walker, scooter,  or crutches that help you move around if needed. Talk with your provider about other ways that you can decrease your risk of falls. This may include seeing a physical therapist to learn to do exercises to improve movement and strength. Where to find more information Centers for Disease Control and Prevention, STEADI: TonerPromos.no [employer] on Aging: BaseRingTones.pl National Institute on Aging: BaseRingTones.pl Contact a health care provider if: You are afraid of falling at home. You feel weak, drowsy, or dizzy at home. You fall at home. Get help right away if you: Lose consciousness or have trouble moving after a fall. Have a fall that causes a head injury. These symptoms may be an emergency. Get help right away. Call 911. Do not wait to see if the symptoms will go away. Do not drive yourself to the hospital. This information is not intended to replace advice given to you by your health care provider. Make sure you discuss any questions you have with your health care provider. Document Revised: 08/14/2022 Document Reviewed: 08/14/2022 Elsevier Patient Education   2024 [employer].

## 2024-10-25 ENCOUNTER — Other Ambulatory Visit: Payer: Self-pay | Admitting: Nurse Practitioner

## 2024-10-27 NOTE — Telephone Encounter (Signed)
 Requested Prescriptions  Pending Prescriptions Disp Refills   pravastatin  (PRAVACHOL ) 80 MG tablet [Pharmacy Med Name: PRAVASTATIN  SODIUM 80 MG TAB] 90 tablet 0    Sig: TAKE 1 TABLET BY MOUTH EVERYDAY AT BEDTIME     Cardiovascular:  Antilipid - Statins Failed - 10/27/2024  4:22 PM      Failed - Lipid Panel in normal range within the last 12 months    Cholesterol  Date Value Ref Range Status  12/10/2023 156 0 - 200 mg/dL Final    Comment:    ATP III Classification       Desirable:  < 200 mg/dL               Borderline High:  200 - 239 mg/dL          High:  > = 759 mg/dL   LDL Cholesterol  Date Value Ref Range Status  12/10/2023 87 0 - 99 mg/dL Final   HDL  Date Value Ref Range Status  12/10/2023 41.00 >39.00 mg/dL Final   Triglycerides  Date Value Ref Range Status  12/10/2023 140.0 0.0 - 149.0 mg/dL Final    Comment:    Normal:  <150 mg/dLBorderline High:  150 - 199 mg/dL         Passed - Patient is not pregnant      Passed - Valid encounter within last 12 months    Recent Outpatient Visits           4 months ago Bilateral rotator cuff dysfunction   [facility] [place], [place] T, NP   8 months ago Mixed hyperlipidemia   [facility] [place], [place] T, NP   9 years ago Wrist pain, acute, left   [facility], [place], PA-C

## 2024-11-18 DIAGNOSIS — H5203 Hypermetropia, bilateral: Secondary | ICD-10-CM | POA: Diagnosis not present

## 2024-12-07 NOTE — Patient Instructions (Incomplete)
 Be Involved in Caring For Your Health:  Taking Medications When medications are taken as directed, they can greatly improve your health. But if they are not taken as prescribed, they may not work. In some cases, not taking them correctly can be harmful. To help ensure your treatment remains effective and safe, understand your medications and how to take them. Bring your medications to each visit for review by your provider.  Your lab results, notes, and after visit summary will be available on My Chart. We strongly encourage you to use this feature. If lab results are abnormal the clinic will contact you with the appropriate steps. If the clinic does not contact you assume the results are satisfactory. You can always view your results on My Chart. If you have questions regarding your health or results, please contact the clinic during office hours. You can also ask questions on My Chart.  We at [facility] are grateful that you chose us  to provide your care. We strive to provide evidence-based and compassionate care and are always looking for feedback. If you get a survey from the clinic please complete this so we can hear your opinions.  Healthy Eating, Adult Healthy eating may help you get and keep a healthy body weight, reduce the risk of chronic disease, and live a long and productive life. It is important to follow a healthy eating pattern. Your nutritional and calorie needs should be met mainly by different nutrient-rich foods. What are tips for following this plan? Reading food labels Read labels and choose the following: Reduced or low sodium products. Juices with 100% fruit juice. Foods with low saturated fats (<3 g per serving) and high polyunsaturated and monounsaturated fats. Foods with whole grains, such as whole wheat, cracked wheat, brown rice, and wild rice. Whole grains that are fortified with folic acid. This is recommended for females who are pregnant or who want to  become pregnant. Read labels and do not eat or drink the following: Foods or drinks with added sugars. These include foods that contain brown sugar, corn sweetener, corn syrup, dextrose , fructose, glucose, high-fructose corn syrup, honey, invert sugar, lactose, malt syrup, maltose, molasses, raw sugar, sucrose, trehalose, or turbinado sugar. Limit your intake of added sugars to less than 10% of your total daily calories. Do not eat more than the following amounts of added sugar per day: 6 teaspoons (25 g) for females. 9 teaspoons (38 g) for males. Foods that contain processed or refined starches and grains. Refined grain products, such as white flour, degermed cornmeal, white bread, and white rice. Shopping Choose nutrient-rich snacks, such as vegetables, whole fruits, and nuts. Avoid high-calorie and high-sugar snacks, such as potato chips, fruit snacks, and candy. Use oil-based dressings and spreads on foods instead of solid fats such as butter, margarine, sour cream, or cream cheese. Limit pre-made sauces, mixes, and instant products such as flavored rice, instant noodles, and ready-made pasta. Try more plant-protein sources, such as tofu, tempeh, black beans, edamame, lentils, nuts, and seeds. Explore eating plans such as the Mediterranean diet or vegetarian diet. Try heart-healthy dips made with beans and healthy fats like hummus and guacamole. Vegetables go great with these. Cooking Use oil to saut or stir-fry foods instead of solid fats such as butter, margarine, or lard. Try baking, boiling, grilling, or broiling instead of frying. Remove the fatty part of meats before cooking. Steam vegetables in water  or broth. Meal planning  At meals, imagine dividing your plate into fourths: One-half of  your plate is fruits and vegetables. One-fourth of your plate is whole grains. One-fourth of your plate is protein, especially lean meats, poultry, eggs, tofu, beans, or nuts. Include low-fat  dairy as part of your daily diet. Lifestyle Choose healthy options in all settings, including home, work, school, restaurants, or stores. Prepare your food safely: Wash your hands after handling raw meats. Where you prepare food, keep surfaces clean by regularly washing with hot, soapy water . Keep raw meats separate from ready-to-eat foods, such as fruits and vegetables. Cook seafood, meat, poultry, and eggs to the recommended temperature. Get a food thermometer. Store foods at safe temperatures. In general: Keep cold foods at 84F (4.4C) or below. Keep hot foods at 184F (60C) or above. Keep your freezer at [facility] (-17.8C) or below. Foods are not safe to eat if they have been between the temperatures of 40-184F (4.4-60C) for more than 2 hours. What foods should I eat? Fruits Aim to eat 1-2 cups of fresh, canned (in natural juice), or frozen fruits each day. One cup of fruit equals 1 small apple, 1 large banana, 8 large strawberries, 1 cup (237 g) canned fruit,  cup (82 g) dried fruit, or 1 cup (240 mL) 100% juice. Vegetables Aim to eat 2-4 cups of fresh and frozen vegetables each day, including different varieties and colors. One cup of vegetables equals 1 cup (91 g) broccoli or cauliflower florets, 2 medium carrots, 2 cups (150 g) raw, leafy greens, 1 large tomato, 1 large bell pepper, 1 large sweet potato, or 1 medium white potato. Grains Aim to eat 5-10 ounce-equivalents of whole grains each day. Examples of 1 ounce-equivalent of grains include 1 slice of bread, 1 cup (40 g) ready-to-eat cereal, 3 cups (24 g) popcorn, or  cup (93 g) cooked rice. Meats and other proteins Try to eat 5-7 ounce-equivalents of protein each day. Examples of 1 ounce-equivalent of protein include 1 egg,  oz nuts (12 almonds, 24 pistachios, or 7 walnut halves), 1/4 cup (90 g) cooked beans, 6 tablespoons (90 g) hummus or 1 tablespoon (16 g) peanut butter. A cut of meat or fish that is the size of a deck of  cards is about 3-4 ounce-equivalents (85 g). Of the protein you eat each week, try to have at least 8 sounce (227 g) of seafood. This is about 2 servings per week. This includes salmon, trout, herring, sardines, and anchovies. Dairy Aim to eat 3 cup-equivalents of fat-free or low-fat dairy each day. Examples of 1 cup-equivalent of dairy include 1 cup (240 mL) milk, 8 ounces (250 g) yogurt, 1 ounces (44 g) natural cheese, or 1 cup (240 mL) fortified soy milk. Fats and oils Aim for about 5 teaspoons (21 g) of fats and oils per day. Choose monounsaturated fats, such as canola and olive oils, mayonnaise made with olive oil or avocado oil, avocados, peanut butter, and most nuts, or polyunsaturated fats, such as sunflower, corn, and soybean oils, walnuts, pine nuts, sesame seeds, sunflower seeds, and flaxseed. Beverages Aim for 6 eight-ounce glasses of water  per day. Limit coffee to 3-5 eight-ounce cups per day. Limit caffeinated beverages that have added calories, such as soda and energy drinks. If you drink alcohol: Limit how much you have to: 0-1 drink a day if you are male. 0-2 drinks a day if you are male. Know how much alcohol is in your drink. In the [place] one drink is one 12 oz bottle of beer (355 mL), one 5 oz glass of wine (  148 mL), or one 1 oz glass of hard liquor (44 mL). Seasoning and other foods Try not to add too much salt to your food. Try using herbs and spices instead of salt. Try not to add sugar to food. This information is based on [place] nutrition guidelines. To learn more, visit [employer]. Exact amounts may vary. You may need different amounts. This information is not intended to replace advice given to you by your health care provider. Make sure you discuss any questions you have with your health care provider. Document Revised: 09/11/2022 Document Reviewed: 09/11/2022 Elsevier Patient Education  2024 [employer].

## 2024-12-10 ENCOUNTER — Encounter: Payer: Medicare HMO | Admitting: Internal Medicine

## 2024-12-12 ENCOUNTER — Ambulatory Visit: Admitting: Nurse Practitioner

## 2024-12-12 ENCOUNTER — Encounter: Payer: Self-pay | Admitting: Nurse Practitioner

## 2024-12-12 VITALS — BP 121/65 | HR 59 | Temp 98.2°F | Resp 17 | Ht 72.0 in | Wt 206.0 lb

## 2024-12-12 DIAGNOSIS — D7589 Other specified diseases of blood and blood-forming organs: Secondary | ICD-10-CM | POA: Diagnosis not present

## 2024-12-12 DIAGNOSIS — B351 Tinea unguium: Secondary | ICD-10-CM | POA: Diagnosis not present

## 2024-12-12 DIAGNOSIS — M25561 Pain in right knee: Secondary | ICD-10-CM | POA: Insufficient documentation

## 2024-12-12 DIAGNOSIS — Z0000 Encounter for general adult medical examination without abnormal findings: Secondary | ICD-10-CM | POA: Diagnosis not present

## 2024-12-12 DIAGNOSIS — E782 Mixed hyperlipidemia: Secondary | ICD-10-CM

## 2024-12-12 DIAGNOSIS — N400 Enlarged prostate without lower urinary tract symptoms: Secondary | ICD-10-CM | POA: Diagnosis not present

## 2024-12-12 MED ORDER — PRAVASTATIN SODIUM 80 MG PO TABS: 80.0000 mg | ORAL_TABLET | Freq: Every day | ORAL | 4 refills | Status: AC

## 2024-12-12 NOTE — Progress Notes (Addendum)
 "  BP 121/65 (BP Location: Left Arm, Patient Position: Sitting, Cuff Size: Normal)   Pulse (!) 59   Temp 98.2 F (36.8 C) (Oral)   Resp 17   Ht 6' (1.829 m)   Wt 206 lb (93.4 kg)   SpO2 98%   BMI 27.94 kg/m    Subjective:    Patient ID: Christopher Santos, male    DOB: Jun 29, 1951, 73 y.o.   MRN: [id number]  HPI: Christopher Santos is a 73 y.o. male presenting on 12/12/2024 for comprehensive medical examination. Current medical complaints include: right knee pain  He currently lives with: wife Interim Problems from his last visit: as above  KNEE PAIN (RIGHT) Since Thanksgiving. Was playing pickle ball when started, but got worse after a golf game. Duration: months Involved knee: right Mechanism of injury: unknown Location:lateral Onset: gradual Severity: 7/10 at worst Quality:  sharp, aching, and throbbing Frequency: constant waxes and wanes in regard to level of pain Radiation: no Aggravating factors: walking, stairs, and bending  Alleviating factors: Advil helped only a little bit  Status: stable Treatments attempted: rest and aleve   Relief with NSAIDs?:  mild Weakness with weight bearing or walking: no Sensation of giving way: no Locking: no Popping: no Bruising: no Swelling: no Redness: no Paresthesias/decreased sensation: no Fevers: no   HYPERLIPIDEMIA Continues Zetia  and Pravastatin . Hyperlipidemia status: good compliance Satisfied with current treatment?  yes Side effects:  no Medication compliance: good compliance Past cholesterol meds: none Supplements: fish oil Aspirin :  yes The 10-year ASCVD risk score (Arnett DK, et al., 2019) is: 15.9%   Values used to calculate the score:     Age: 84 years     Clinically relevant sex: Male     Is Non-Hispanic African American: No     Diabetic: No     Tobacco smoker: No     Systolic Blood Pressure: 121 mmHg     Is BP treated: No     HDL Cholesterol: 44 mg/dL     Total Cholesterol: 141 mg/dL Chest pain:   no Coronary artery disease:  no Family history CAD:  yes Family history early CAD:  no   Functional Status Survey: Is the patient deaf or have difficulty hearing?: No Does the patient have difficulty seeing, even when wearing glasses/contacts?: No Does the patient have difficulty concentrating, remembering, or making decisions?: No Does the patient have difficulty walking or climbing stairs?: No Does the patient have difficulty dressing or bathing?: No Does the patient have difficulty doing errands alone such as visiting a doctor's office or shopping?: No  FALL RISK:    12/12/2024    8:22 AM 10/02/2024    8:59 AM 09/29/2024   10:34 AM 06/24/2024    8:46 AM 02/28/2024    9:17 AM  Fall Risk   Falls in the past year? 0 0 0  0 0  Number falls in past yr: 0 0  0 0  Injury with Fall? 0 0   0  0   Risk for fall due to : No Fall Risks No Fall Risks  No Fall Risks No Fall Risks  Follow up Falls evaluation completed Falls evaluation completed  Falls evaluation completed Falls evaluation completed     Manually entered by patient   Data saved with a previous flowsheet row definition    Depression Screen    10/02/2024    8:53 AM 06/24/2024    8:46 AM 02/28/2024    9:18 AM 12/25/2023  1:38 PM 12/10/2023    9:44 AM  Depression screen PHQ 2/9  Decreased Interest 0 0 0 0 0  Down, Depressed, Hopeless 0 0 0 0 0  PHQ - 2 Score 0 0 0 0 0  Altered sleeping 0 0     Tired, decreased energy 0 0     Change in appetite 0 0     Feeling bad or failure about yourself  0 0     Trouble concentrating 0 0     Moving slowly or fidgety/restless 0 0     Suicidal thoughts 0 0     PHQ-9 Score 0  0      Difficult doing work/chores Not difficult at all Not difficult at all        Data saved with a previous flowsheet row definition      06/24/2024    8:47 AM 02/28/2024    9:18 AM  GAD 7 : Generalized Anxiety Score  Nervous, Anxious, on Edge 0 0  Control/stop worrying 0 0  Worry too much - different things 0  0  Trouble relaxing 0 0  Restless 0 0  Easily annoyed or irritable 0 0  Afraid - awful might happen 0 0  Total GAD 7 Score 0 0  Anxiety Difficulty Not difficult at all    Past Medical History:  Past Medical History:  Diagnosis Date   Allergy    Blood transfusion without reported diagnosis    01-26-2020   COVID-19 virus infection 01/26/2020   Hyperlipemia    Hypertension    on occasion but not treated     Surgical History:  Past Surgical History:  Procedure Laterality Date   COLONOSCOPY     HERNIA REPAIR  2023   INGUINAL HERNIA REPAIR Right 01/26/2022   Procedure: OPEN RIGHT INGUINAL HERNIA REPAIR WITH MESH;  Surgeon: Vernetta Berg, MD;  Location: [facility];  Service: General;  Laterality: Right;   POLYPECTOMY     TONSILLECTOMY     at age 51    Medications:  Current Outpatient Medications on File Prior to Visit  Medication Sig   Ascorbic Acid  (VITAMIN C) 1000 MG tablet Take 1,000 mg by mouth daily.   aspirin  81 MG tablet Take 81 mg by mouth at bedtime.    b complex vitamins capsule Take 1 capsule by mouth daily.   cyanocobalamin  (VITAMIN B12) 500 MCG tablet Take 500 mcg by mouth daily.   ezetimibe  (ZETIA ) 10 MG tablet TAKE 1 TABLET BY MOUTH EVERY DAY   L-Lysine 500 MG CAPS Take by mouth.   magnesium oxide (MAG-OX) 400 (240 Mg) MG tablet Take 400 mg by mouth daily.   Omega-3 Fatty Acids (OMEGA 3 500 PO) Take by mouth.   vitamin E 200 UNIT capsule Take 200 Units by mouth daily.   No current facility-administered medications on file prior to visit.    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 , [employer] (used to be [employer])  Tobacco Use   Smoking status: Former    Current packs/day: 0.00    Average  packs/day: 0.5 packs/day for 18.0 years (9.0 ttl pk-yrs)    Types: Cigarettes    Start date: 85  Quit date: 56    Years since quitting: 33.0   Smokeless tobacco: Never   Tobacco comments:    quit 1993, no heavy use   Vaping Use   Vaping status: Never Used  Substance and Sexual Activity   Alcohol use: Yes    Alcohol/week: 4.0 standard drinks of alcohol    Types: 2 Glasses of wine, 2 Shots of liquor per week    Comment: 1 glass of wine or 1 shot of liquor 2 times per week   Drug use: Never   Sexual activity: Not Currently    Birth control/protection: None  Other Topics Concern   Not on file  Social History Narrative   Married, no children, original from [place]   Social Drivers of Health   Tobacco Use: Medium Risk (12/31/2024)   Patient History    Smoking Tobacco Use: Former    Smokeless Tobacco Use: Never    Passive Exposure: Not on Actuary Strain: Low Risk (12/09/2024)   Overall Financial Resource Strain (CARDIA)    Difficulty of Paying Living Expenses: Not hard at all  Food Insecurity: No Food Insecurity (12/09/2024)   Epic    Worried About Radiation Protection Practitioner of Food in the Last Year: Never true    Ran Out of Food in the Last Year: Never true  Transportation Needs: No Transportation Needs (12/09/2024)   Epic    Lack of Transportation (Medical): No    Lack of Transportation (Non-Medical): No  Physical Activity: Insufficiently Active (12/09/2024)   Exercise Vital Sign    Days of Exercise per Week: 2 days    Minutes of Exercise per Session: 20 min  Stress: No Stress Concern Present (12/09/2024)   [employer] of Occupational Health - Occupational Stress Questionnaire    Feeling of Stress: Only a little  Social Connections: Unknown (12/09/2024)   Social Connection and Isolation Panel    Frequency of Communication with Friends and Family: More than three times a week    Frequency of Social Gatherings with Friends and Family: More than three times a  week    Attends Religious Services: Patient declined    Database Administrator or Organizations: Patient declined    Attends Engineer, Structural: Not on file    Marital Status: Married  Recent Concern: Social Connections - Moderately Isolated (10/02/2024)   Social Connection and Isolation Panel    Frequency of Communication with Friends and Family: More than three times a week    Frequency of Social Gatherings with Friends and Family: More than three times a week    Attends Religious Services: Never    Database Administrator or Organizations: No    Attends Banker Meetings: Never    Marital Status: Married  Catering Manager Violence: Not At Risk (10/02/2024)   Epic    Fear of Current or Ex-Partner: No    Emotionally Abused: No    Physically Abused: No    Sexually Abused: No  Depression (PHQ2-9): Low Risk (10/02/2024)   Depression (PHQ2-9)    PHQ-2 Score: 0  Alcohol Screen: Low Risk (12/09/2024)   Alcohol Screen    Last Alcohol Screening Score (AUDIT): 5  Housing: Low Risk (12/09/2024)   Epic    Unable to Pay for Housing in the Last Year: No    Number of Times Moved in the Last Year: 0    Homeless in the Last Year: No  Utilities: Not At Risk (10/02/2024)   Epic  Threatened with loss of utilities: No  Health Literacy: Adequate Health Literacy (10/02/2024)   B1300 Health Literacy    Frequency of need for help with medical instructions: Never   Tobacco Use History[2] Social History   Substance and Sexual Activity  Alcohol Use Yes   Alcohol/week: 4.0 standard drinks of alcohol   Types: 2 Glasses of wine, 2 Shots of liquor per week   Comment: 1 glass of wine or 1 shot of liquor 2 times per week    Family History:  Family History  Problem Relation Age of Onset   CAD Father 81       MI at 27   Rheum arthritis Brother    Diabetes Neg Hx    Colon cancer Neg Hx    Prostate cancer Neg Hx    Colon polyps Neg Hx    Esophageal cancer Neg Hx    Rectal  cancer Neg Hx    Stomach cancer Neg Hx     Past medical history, surgical history, medications, allergies, family history and social history reviewed with patient today and changes made to appropriate areas of the chart.   ROS All other ROS negative except what is listed above and in the HPI.      Objective:    BP 121/65 (BP Location: Left Arm, Patient Position: Sitting, Cuff Size: Normal)   Pulse (!) 59   Temp 98.2 F (36.8 C) (Oral)   Resp 17   Ht 6' (1.829 m)   Wt 206 lb (93.4 kg)   SpO2 98%   BMI 27.94 kg/m   Wt Readings from Last 3 Encounters:  12/12/24 206 lb (93.4 kg)  10/02/24 200 lb (90.7 kg)  06/24/24 205 lb 3.2 oz (93.1 kg)   Physical Exam Vitals and nursing note reviewed.  Constitutional:      General: He is awake. He is not in acute distress.    Appearance: He is well-developed and well-groomed. He is not ill-appearing or toxic-appearing.  HENT:     Head: Normocephalic and atraumatic.     Right Ear: Hearing, tympanic membrane, ear canal and external ear normal. No drainage.     Left Ear: Hearing, tympanic membrane, ear canal and external ear normal. No drainage.     Nose: Nose normal.     Mouth/Throat:     Pharynx: Uvula midline.  Eyes:     General: Lids are normal.        Right eye: No discharge.        Left eye: No discharge.     Extraocular Movements: Extraocular movements intact.     Conjunctiva/sclera: Conjunctivae normal.     Pupils: Pupils are equal, round, and reactive to light.     Visual Fields: Right eye visual fields normal and left eye visual fields normal.  Neck:     Thyroid : No thyromegaly.     Vascular: No carotid bruit or JVD.     Trachea: Trachea normal.  Cardiovascular:     Rate and Rhythm: Normal rate and regular rhythm.     Heart sounds: Normal heart sounds, S1 normal and S2 normal. No murmur heard.    No gallop.  Pulmonary:     Effort: Pulmonary effort is normal. No accessory muscle usage or respiratory distress.     Breath  sounds: No decreased breath sounds, wheezing or rales.  Abdominal:     General: Bowel sounds are normal.     Palpations: Abdomen is soft. There is no hepatomegaly or splenomegaly.  Tenderness: There is no abdominal tenderness.  Musculoskeletal:        General: Normal range of motion.     Cervical back: Normal range of motion and neck supple.     Right knee: Crepitus present. No swelling or bony tenderness. Normal range of motion. No tenderness.     Instability Tests: Anterior drawer test negative. Posterior drawer test negative. Medial McMurray test negative and lateral McMurray test negative.     Left knee: Normal.     Right lower leg: No edema.     Left lower leg: No edema.  Feet:     Right foot:     Toenail Condition: Fungal disease present.    Left foot:     Toenail Condition: Left toenails are abnormally thick.  Lymphadenopathy:     Head:     Right side of head: No submental, submandibular, tonsillar, preauricular or posterior auricular adenopathy.     Left side of head: No submental, submandibular, tonsillar, preauricular or posterior auricular adenopathy.     Cervical: No cervical adenopathy.  Skin:    General: Skin is warm and dry.     Capillary Refill: Capillary refill takes less than 2 seconds.     Findings: No rash.  Neurological:     Mental Status: He is alert and oriented to person, place, and time.     Gait: Gait is intact.     Deep Tendon Reflexes: Reflexes are normal and symmetric.     Reflex Scores:      Brachioradialis reflexes are 2+ on the right side and 2+ on the left side.      Patellar reflexes are 2+ on the right side and 2+ on the left side. Psychiatric:        Attention and Perception: Attention normal.        Mood and Affect: Mood normal.        Speech: Speech normal.        Behavior: Behavior normal. Behavior is cooperative.        Thought Content: Thought content normal.        Cognition and Memory: Cognition normal.        10/02/2024     8:59 AM  6CIT Screen  What Year? 0 points  What month? 0 points  What time? 0 points  Count back from 20 0 points  Months in reverse 0 points  Repeat phrase 0 points  Total Score 0 points   Results for orders placed or performed in visit on 12/12/24  CBC with Differential/Platelet   Collection Time: 12/12/24  8:28 AM  Result Value Ref Range   WBC 4.9 3.4 - 10.8 x10E3/uL   RBC 4.05 (L) 4.14 - 5.80 x10E6/uL   Hemoglobin 14.2 13.0 - 17.7 g/dL   Hematocrit 57.3 62.4 - 51.0 %   MCV 105 (H) 79 - 97 fL   MCH 35.1 (H) 26.6 - 33.0 pg   MCHC 33.3 31.5 - 35.7 g/dL   RDW 86.2 88.3 - 84.5 %   Platelets 325 150 - 450 x10E3/uL   Neutrophils 31 Not Estab. %   Lymphs 47 Not Estab. %   Monocytes 14 Not Estab. %   Eos 6 Not Estab. %   Basos 1 Not Estab. %   Neutrophils Absolute 1.5 1.4 - 7.0 x10E3/uL   Lymphocytes Absolute 2.3 0.7 - 3.1 x10E3/uL   Monocytes Absolute 0.7 0.1 - 0.9 x10E3/uL   EOS (ABSOLUTE) 0.3 0.0 - 0.4 x10E3/uL   Basophils  Absolute 0.0 0.0 - 0.2 x10E3/uL   Immature Granulocytes 0 Not Estab. %   Immature Grans (Abs) 0.0 0.0 - 0.1 x10E3/uL  Comprehensive metabolic panel with GFR   Collection Time: 12/12/24  8:28 AM  Result Value Ref Range   Glucose 106 (H) 70 - 99 mg/dL   BUN 17 8 - 27 mg/dL   Creatinine, Ser 8.99 0.76 - 1.27 mg/dL   eGFR 80 >40 fO/fpw/8.26   BUN/Creatinine Ratio 17 10 - 24   Sodium 142 134 - 144 mmol/L   Potassium 4.3 3.5 - 5.2 mmol/L   Chloride 105 96 - 106 mmol/L   CO2 26 20 - 29 mmol/L   Calcium  9.6 8.6 - 10.2 mg/dL   Total Protein 6.9 6.0 - 8.5 g/dL   Albumin 4.5 3.8 - 4.8 g/dL   Globulin, Total 2.4 1.5 - 4.5 g/dL   Bilirubin Total 0.4 0.0 - 1.2 mg/dL   Alkaline Phosphatase 81 47 - 123 IU/L   AST 24 0 - 40 IU/L   ALT 21 0 - 44 IU/L  Lipid Panel w/o Chol/HDL Ratio   Collection Time: 12/12/24  8:28 AM  Result Value Ref Range   Cholesterol, Total 141 100 - 199 mg/dL   Triglycerides 87 0 - 149 mg/dL   HDL 44 >60 mg/dL   VLDL Cholesterol Cal  17 5 - 40 mg/dL   LDL Chol Calc (NIH) 80 0 - 99 mg/dL  TSH   Collection Time: 12/12/24  8:28 AM  Result Value Ref Range   TSH 1.320 0.450 - 4.500 uIU/mL  PSA   Collection Time: 12/12/24  8:28 AM  Result Value Ref Range   Prostate Specific Ag, Serum 0.8 0.0 - 4.0 ng/mL      Assessment & Plan:   Problem List Items Addressed This Visit       Musculoskeletal and Integument   Onychomycosis   To right great toenail, with nail curving into second toe. Causing discomfort. Referral to ortho placed.      Relevant Orders   Ambulatory referral to Podiatry     Other   Right knee pain   Acute for a few weeks. Suspect some OA presenting. Recommend starting with simple treatment at home to include Aleeve, Voltaren, Tylenol  as needed. Wear knee support sleeve when active. Provided him with PT exercises to perform at home for this. Return in 4 weeks and if no improvement will consider imaging and possibly injection.      Mixed hyperlipidemia - Primary   Chronic, stable.  Continue current medication regimen and adjust as needed.  Labs today. Refills sent.      Relevant Medications   pravastatin  (PRAVACHOL ) 80 MG tablet   Other Relevant Orders   Comprehensive metabolic panel with GFR (Completed)   Lipid Panel w/o Chol/HDL Ratio (Completed)   Macrocytosis without anemia   History of this, had full work-up with hematology which was overall reassuring and to return to them as needed. Check labs today.      Relevant Orders   CBC with Differential/Platelet (Completed)   TSH (Completed)   Other Visit Diagnoses       Benign prostatic hyperplasia without lower urinary tract symptoms       PSA on labs today, wishes to continue screening.   Relevant Orders   PSA (Completed)     Encounter for annual physical exam       Annual physical today with labs and health maintenance reviewed, discussed with patient.  Discussed aspirin  prophylaxis for myocardial infarction prevention and  decision was made to continue ASA  LABORATORY TESTING:  Health maintenance labs ordered today as discussed above.   The natural history of prostate cancer and ongoing controversy regarding screening and potential treatment outcomes of prostate cancer has been discussed with the patient. The meaning of a false positive PSA and a false negative PSA has been discussed. He indicates understanding of the limitations of this screening test and wishes  to proceed with screening PSA testing. Discussed guidelines on PSA checks stopping at 70, wishes to continue which is appropriate based on his overall health.   IMMUNIZATIONS:   - Tdap: Tetanus vaccination status reviewed: last tetanus booster within 10 years. - Influenza: Up to date - Pneumovax: Up to date - Prevnar: Up to date - Zostavax vaccine: Up to date  SCREENING: - Colonoscopy: due in April 2026  Discussed with patient purpose of the colonoscopy is to detect colon cancer at curable precancerous or early stages   - AAA Screening: Not applicable  -Hearing Test: Not applicable  -Spirometry: Not applicable   PATIENT COUNSELING:    Sexuality: Discussed sexually transmitted diseases, partner selection, use of condoms, avoidance of unintended pregnancy  and contraceptive alternatives.   Advised to avoid cigarette smoking.  I discussed with the patient that most people either abstain from alcohol or drink within safe limits (<=14/week and <=4 drinks/occasion for males, <=7/weeks and <= 3 drinks/occasion for females) and that the risk for alcohol disorders and other health effects rises proportionally with the number of drinks per week and how often a drinker exceeds daily limits.  Discussed cessation/primary prevention of drug use and availability of treatment for abuse.   Diet: Encouraged to adjust caloric intake to maintain  or achieve ideal body weight, to reduce intake of dietary saturated fat and total fat, to limit sodium intake by  avoiding high sodium foods and not adding table salt, and to maintain adequate dietary potassium and calcium  preferably from fresh fruits, vegetables, and low-fat dairy products.    Stressed the importance of regular exercise  Injury prevention: Discussed safety belts, safety helmets, smoke detector, smoking near bedding or upholstery.   Dental health: Discussed importance of regular tooth brushing, flossing, and dental visits.   Follow up plan: NEXT PREVENTATIVE PHYSICAL DUE IN 1 YEAR. Return in about 4 weeks (around 01/09/2025) for RIGHT KNEE PAIN.      [1] No Known Allergies [2]  Social History Tobacco Use  Smoking Status Former   Current packs/day: 0.00   Average packs/day: 0.5 packs/day for 18.0 years (9.0 ttl pk-yrs)   Types: Cigarettes   Start date: 8   Quit date: 1993   Years since quitting: 33.0  Smokeless Tobacco Never  Tobacco Comments   quit 1993, no heavy use    "

## 2024-12-12 NOTE — Assessment & Plan Note (Signed)
Chronic, stable.  Continue current medication regimen and adjust as needed.  Labs today. Refills sent.

## 2024-12-12 NOTE — Assessment & Plan Note (Signed)
 To right great toenail, with nail curving into second toe. Causing discomfort. Referral to ortho placed.

## 2024-12-12 NOTE — Assessment & Plan Note (Signed)
 History of this, had full work-up with hematology which was overall reassuring and to return to them as needed. Check labs today.

## 2024-12-12 NOTE — Assessment & Plan Note (Signed)
 Acute for a few weeks. Suspect some OA presenting. Recommend starting with simple treatment at home to include Aleeve, Voltaren, Tylenol  as needed. Wear knee support sleeve when active. Provided him with PT exercises to perform at home for this. Return in 4 weeks and if no improvement will consider imaging and possibly injection.

## 2024-12-13 ENCOUNTER — Ambulatory Visit: Payer: Self-pay | Admitting: Nurse Practitioner

## 2024-12-13 LAB — COMPREHENSIVE METABOLIC PANEL WITH GFR
ALT: 21 IU/L (ref 0–44)
AST: 24 IU/L (ref 0–40)
Albumin: 4.5 g/dL (ref 3.8–4.8)
Alkaline Phosphatase: 81 IU/L (ref 47–123)
BUN/Creatinine Ratio: 17 (ref 10–24)
BUN: 17 mg/dL (ref 8–27)
Bilirubin Total: 0.4 mg/dL (ref 0.0–1.2)
CO2: 26 mmol/L (ref 20–29)
Calcium: 9.6 mg/dL (ref 8.6–10.2)
Chloride: 105 mmol/L (ref 96–106)
Creatinine, Ser: 1 mg/dL (ref 0.76–1.27)
Globulin, Total: 2.4 g/dL (ref 1.5–4.5)
Glucose: 106 mg/dL — ABNORMAL HIGH (ref 70–99)
Potassium: 4.3 mmol/L (ref 3.5–5.2)
Sodium: 142 mmol/L (ref 134–144)
Total Protein: 6.9 g/dL (ref 6.0–8.5)
eGFR: 80 mL/min/1.73

## 2024-12-13 LAB — CBC WITH DIFFERENTIAL/PLATELET
Basophils Absolute: 0 x10E3/uL (ref 0.0–0.2)
Basos: 1 %
EOS (ABSOLUTE): 0.3 x10E3/uL (ref 0.0–0.4)
Eos: 6 %
Hematocrit: 42.6 % (ref 37.5–51.0)
Hemoglobin: 14.2 g/dL (ref 13.0–17.7)
Immature Grans (Abs): 0 x10E3/uL (ref 0.0–0.1)
Immature Granulocytes: 0 %
Lymphocytes Absolute: 2.3 x10E3/uL (ref 0.7–3.1)
Lymphs: 47 %
MCH: 35.1 pg — ABNORMAL HIGH (ref 26.6–33.0)
MCHC: 33.3 g/dL (ref 31.5–35.7)
MCV: 105 fL — ABNORMAL HIGH (ref 79–97)
Monocytes Absolute: 0.7 x10E3/uL (ref 0.1–0.9)
Monocytes: 14 %
Neutrophils Absolute: 1.5 x10E3/uL (ref 1.4–7.0)
Neutrophils: 31 %
Platelets: 325 x10E3/uL (ref 150–450)
RBC: 4.05 x10E6/uL — ABNORMAL LOW (ref 4.14–5.80)
RDW: 13.7 % (ref 11.6–15.4)
WBC: 4.9 x10E3/uL (ref 3.4–10.8)

## 2024-12-13 LAB — LIPID PANEL W/O CHOL/HDL RATIO
Cholesterol, Total: 141 mg/dL (ref 100–199)
HDL: 44 mg/dL
LDL Chol Calc (NIH): 80 mg/dL (ref 0–99)
Triglycerides: 87 mg/dL (ref 0–149)
VLDL Cholesterol Cal: 17 mg/dL (ref 5–40)

## 2024-12-13 LAB — PSA: Prostate Specific Ag, Serum: 0.8 ng/mL (ref 0.0–4.0)

## 2024-12-13 LAB — TSH: TSH: 1.32 u[IU]/mL (ref 0.450–4.500)

## 2024-12-13 NOTE — Progress Notes (Signed)
 Contacted via MyChart  Good evening Christopher Santos, your labs have returned and overall remain stable. Great news!!  Continue to take amazing care of your health. Any questions? Keep being amazing!!  Thank you for allowing me to participate in your care.  I appreciate you. Kindest regards, Christohper Dube

## 2024-12-31 ENCOUNTER — Ambulatory Visit: Admitting: Podiatry

## 2024-12-31 ENCOUNTER — Ambulatory Visit (INDEPENDENT_AMBULATORY_CARE_PROVIDER_SITE_OTHER)

## 2024-12-31 ENCOUNTER — Encounter: Payer: Self-pay | Admitting: Podiatry

## 2024-12-31 DIAGNOSIS — L603 Nail dystrophy: Secondary | ICD-10-CM

## 2024-12-31 DIAGNOSIS — M205X1 Other deformities of toe(s) (acquired), right foot: Secondary | ICD-10-CM

## 2024-12-31 NOTE — Progress Notes (Signed)
 "  Subjective:  Patient ID: Christopher Santos, male    DOB: 07/19/1951,  MRN: [id number] HPI Chief Complaint  Patient presents with   Nail Problem    New Patient Onychomycosis    74 y.o. male presents with the above complaint.   ROS: Denies fever chills nausea mobic  muscle aches pains calf pain back pain chest pain shortness of breath.  Past Medical History:  Diagnosis Date   Allergy    Blood transfusion without reported diagnosis    01-26-2020   COVID-19 virus infection 01/26/2020   Hyperlipemia    Hypertension    on occasion but not treated    Past Surgical History:  Procedure Laterality Date   COLONOSCOPY     HERNIA REPAIR  2023   INGUINAL HERNIA REPAIR Right 01/26/2022   Procedure: OPEN RIGHT INGUINAL HERNIA REPAIR WITH MESH;  Surgeon: Vernetta Berg, MD;  Location: [facility];  Service: General;  Laterality: Right;   POLYPECTOMY     TONSILLECTOMY     at age 44   Current Medications[1]  Allergies[2] Review of Systems Objective:  There were no vitals filed for this visit.  General: Well developed, nourished, in no acute distress, alert and oriented x3   Dermatological: Skin is warm, dry and supple bilateral. Nails x 10 are well maintained however they are slightly longer and the hallux nails do curve this appears to be more of a genetic predisposition.  There is some distal onycholysis to the hallux right with some proximal subungual blood however that is already growing out.  No signs of fungal infection.; remaining integument appears unremarkable at this time. There are no open sores, no preulcerative lesions, no rash or signs of infection present.  Vascular: Dorsalis Pedis artery and Posterior Tibial artery pedal pulses are 2/4 bilateral with immedate capillary fill time. Pedal hair growth present. No varicosities and no lower extremity edema present bilateral.   Neruologic: Grossly intact via light touch bilateral. Vibratory intact via tuning fork  bilateral. Protective threshold with Semmes Wienstein monofilament intact to all pedal sites bilateral. Patellar and Achilles deep tendon reflexes 2+ bilateral. No Babinski or clonus noted bilateral.   Musculoskeletal: No gross boney pedal deformities bilateral. No pain, crepitus, or limitation noted with foot and ankle range of motion bilateral. Muscular strength 5/5 in all groups tested bilateral.  Large nodular mass of the first metatarsophalangeal joint of the right foot dorsally.  This is palpable.  He has good range of motion without crepitation.  Most likely osteoarthritic change.  Gait: Unassisted, Nonantalgic.    Radiographs:  Radiographs taken today of the right foot demonstrate osseously mature individual significant joint space narrowing subchondral sclerosis and eburnation with a fracture of a spur on the dorsal aspect of the first metatarsal head right foot.  This is consistent with osteoarthritic change and spurring.  Assessment & Plan:   Assessment: Nail dystrophy hallux bilateral primarily and osteoarthritis first metatarsophalangeal joint right foot.  Plan: Discussed etiology pathology conservative surgical therapies at this point discussed cleaning of the first metatarsal phalangeal joint initially should this fail to render him asymptomatic then we would consider joint replacement down the line.  We also discussed injection therapy and oral anti-inflammatory therapy as well as topical anti-inflammatory therapy.  I debrided his nails today demonstrating to him how to keep his nails cut for the problem he has.  I will follow-up with him on an as-needed basis.     Briar Sword T. Anish Vana, DPM    [1]  Current Outpatient Medications:    Ascorbic Acid  (VITAMIN C) 1000 MG tablet, Take 1,000 mg by mouth daily., Disp: , Rfl:    aspirin  81 MG tablet, Take 81 mg by mouth at bedtime. , Disp: , Rfl:    b complex vitamins capsule, Take 1 capsule by mouth daily., Disp: , Rfl:     cyanocobalamin  (VITAMIN B12) 500 MCG tablet, Take 500 mcg by mouth daily., Disp: , Rfl:    ezetimibe  (ZETIA ) 10 MG tablet, TAKE 1 TABLET BY MOUTH EVERY DAY, Disp: 90 tablet, Rfl: 3   L-Lysine 500 MG CAPS, Take by mouth., Disp: , Rfl:    magnesium oxide (MAG-OX) 400 (240 Mg) MG tablet, Take 400 mg by mouth daily., Disp: , Rfl:    Omega-3 Fatty Acids (OMEGA 3 500 PO), Take by mouth., Disp: , Rfl:    pravastatin  (PRAVACHOL ) 80 MG tablet, Take 1 tablet (80 mg total) by mouth daily., Disp: 90 tablet, Rfl: 4   vitamin E 200 UNIT capsule, Take 200 Units by mouth daily., Disp: , Rfl:  [2] No Known Allergies  "

## 2025-01-10 NOTE — Patient Instructions (Incomplete)
 IMAGING LOCATION: [facility] [address] [place], [place], [place] [address] [phone]   Exercises for Chronic Knee Pain Chronic knee pain is pain that lasts longer than 3 months. For most people with chronic knee pain, exercise and weight loss is an important part of treatment. Your health care provider may want you to focus on: Making the muscles that support your knee stronger. This can take pressure off your knee and reduce pain. Preventing knee stiffness. How far you can move your knee, keeping it there or making it farther. Losing weight (if this applies) to take pressure off your knee, lower your risk for injury, and make it easier for you to exercise. Your provider will help you make an exercise program that fits your needs and physical abilities. Below are simple, low-impact exercises you can do at home. Ask your provider or physical therapist how often you should do your exercise program and how many times to repeat each exercise. General safety tips  Get your provider's approval before doing any exercises. Start slowly and stop any time you feel pain. Do not exercise if your knee pain is flaring up. Warm up first. Stretching a cold muscle can cause an injury. Do 5-10 minutes of easy movement or light stretching before beginning your exercises. Do 5-10 minutes of low-impact activity (like walking or cycling) before starting strengthening exercises. Contact your provider any time you have pain during or after exercising. Exercise can cause discomfort but should not be painful. It is normal to be a little stiff or sore after exercising. Stretching and range-of-motion exercises Front thigh stretch  Stand up straight and support your body by holding on to a chair or resting one hand on a wall. With your legs straight and close together, bend one knee to lift your heel up toward your butt. Using one hand for support, grab your ankle with your free hand. Pull your foot up closer toward your  butt to feel the stretch in front of your thigh. Hold the stretch for 30 seconds. Repeat __________ times. Complete this exercise __________ times a day. Back thigh stretch  Sit on the floor with your back straight and your legs out straight in front of you. Place the palms of your hands on the floor and slide them toward your feet as you bend at the hip. Try to touch your nose to your knees and feel the stretch in the back of your thighs. Hold for 30 seconds. Repeat __________ times. Complete this exercise __________ times a day. Calf stretch  Stand facing a wall. Place the palms of your hands flat against the wall, arms extended, and lean slightly against the wall. Get into a lunge position with one leg bent at the knee and the other leg stretched out straight behind you. Keep both feet facing the wall and increase the bend in your knee while keeping the heel of the other leg flat on the ground. You should feel the stretch in your calf. Hold for 30 seconds. Repeat __________ times. Complete this exercise __________ times a day. Strengthening exercises Straight leg lift  Lie on your back with one knee bent and the other leg out straight. Slowly lift the straight leg without bending the knee. Lift until your foot is about 12 inches (30 cm) off the floor. Hold for 3-5 seconds and slowly lower your leg. Repeat __________ times. Complete this exercise __________ times a day. Single leg dip  Stand between two chairs and put both hands on the  backs of the chairs for support. Extend one leg out straight with your body weight resting on the heel of the standing leg. Slowly bend your standing knee to dip your body to the level that is comfortable for you. Hold for 3-5 seconds. Repeat __________ times. Complete this exercise __________ times a day. Hamstring curls  Stand straight, knees close together, facing the back of a chair. Hold on to the back of a chair with both hands. Keep one  leg straight. Bend the other knee while bringing the heel up toward the butt until the knee is bent at a 90-degree angle (right angle). Hold for 3-5 seconds. Repeat __________ times. Complete this exercise __________ times a day. Wall squat  Stand straight with your back, hips, and head against a wall. Step forward one foot at a time with your back still against the wall. Your feet should be 2 feet (61 cm) from the wall at shoulder width. Keeping your back, hips, and head against the wall, slide down the wall to as close to a sitting position as you can get. Hold for 5-10 seconds, then slowly slide back up. Repeat __________ times. Complete this exercise __________ times a day. Step-ups  Stand in front of a sturdy platform or stool that is about 6 inches (15 cm) high. Slowly step up with your left / right foot, keeping your knee in line with your hip and foot. Do not let your knee bend so far that you cannot see your toes. Hold on to a chair for balance, but do not use it for support. Slowly unlock your knee and lower yourself to the starting position. Repeat __________ times. Complete this exercise __________ times a day. Contact a health care provider if: Your exercises cause pain. Your pain is worse after you exercise. Your pain prevents you from doing your exercises. This information is not intended to replace advice given to you by your health care provider. Make sure you discuss any questions you have with your health care provider. Document Revised: 12/26/2022 Document Reviewed: 12/26/2022 Elsevier Patient Education  2024 [employer].

## 2025-01-12 ENCOUNTER — Ambulatory Visit
Admission: RE | Admit: 2025-01-12 | Discharge: 2025-01-12 | Disposition: A | Source: Ambulatory Visit | Attending: Nurse Practitioner | Admitting: Nurse Practitioner

## 2025-01-12 ENCOUNTER — Encounter: Payer: Self-pay | Admitting: Nurse Practitioner

## 2025-01-12 ENCOUNTER — Ambulatory Visit
Admission: RE | Admit: 2025-01-12 | Discharge: 2025-01-12 | Disposition: A | Discharge status: Home / Self Care | Attending: Nurse Practitioner | Admitting: Nurse Practitioner

## 2025-01-12 ENCOUNTER — Ambulatory Visit (INDEPENDENT_AMBULATORY_CARE_PROVIDER_SITE_OTHER): Admitting: Nurse Practitioner

## 2025-01-12 VITALS — BP 126/78 | HR 60 | Temp 97.4°F | Resp 17 | Ht 72.01 in | Wt 208.6 lb

## 2025-01-12 DIAGNOSIS — M25561 Pain in right knee: Secondary | ICD-10-CM

## 2025-01-12 DIAGNOSIS — G8929 Other chronic pain: Secondary | ICD-10-CM | POA: Diagnosis not present

## 2025-01-12 DIAGNOSIS — Z1211 Encounter for screening for malignant neoplasm of colon: Secondary | ICD-10-CM | POA: Diagnosis not present

## 2025-01-12 NOTE — Assessment & Plan Note (Signed)
 Overall improving, some reduction of ROM still with bending of knee. Will get imaging of knee to further assess and have baseline, ?some arthritic changes. Recommend he continue at home care and exercises. If worsening could consider knee steroid injection.

## 2025-01-12 NOTE — Progress Notes (Signed)
 "  BP 126/78 (BP Location: Left Arm, Patient Position: Sitting, Cuff Size: Normal)   Pulse 60   Temp (!) 97.4 F (36.3 C) (Oral)   Resp 17   Ht 6' 0.01 (1.829 m)   Wt 208 lb 9.6 oz (94.6 kg)   SpO2 98%   BMI 28.28 kg/m    Subjective:    Patient ID: Christopher Santos, male    DOB: [birth year], 74 y.o.   MRN: [id number]  HPI: Christopher Santos is a 74 y.o. male  Chief Complaint  Patient presents with   Knee Pain    Here for right knee pain, would like to discuss upcoming colonoscopy .   KNEE PAIN (RIGHT) Follow-up today for right knee pain, did OTC and simple treatments as instructed. States pain is much better.  Continues to have some tightness with bending knee. Is performing exercises. Duration: weeks Involved knee: right Mechanism of injury: unknown Location:posterior Onset: gradual Severity: 1/10  Quality:  sharp and aching Frequency: intermittent Radiation: no Aggravating factors: bending and movement  Alleviating factors: physical therapy, APAP, NSAIDs, and rest -- doing home PT exercises Status: improving Treatments attempted: rest, APAP, ibuprofen, and physical therapy  doing PT exercises at home Relief with NSAIDs?:  no Weakness with weight bearing or walking: no Sensation of giving way: no Locking: no Popping: no Bruising: no Swelling: no Redness: no Paresthesias/decreased sensation: no Fevers: no   Relevant past medical, surgical, family and social history reviewed and updated as indicated. Interim medical history since our last visit reviewed. Allergies and medications reviewed and updated.  Review of Systems  Constitutional:  Negative for activity change, diaphoresis, fatigue and fever.  Respiratory:  Negative for cough, chest tightness, shortness of breath and wheezing.   Cardiovascular:  Negative for chest pain, palpitations and leg swelling.  Gastrointestinal: Negative.   Musculoskeletal:  Positive for arthralgias.  Neurological: Negative.    Psychiatric/Behavioral: Negative.      Per HPI unless specifically indicated above     Objective:    BP 126/78 (BP Location: Left Arm, Patient Position: Sitting, Cuff Size: Normal)   Pulse 60   Temp (!) 97.4 F (36.3 C) (Oral)   Resp 17   Ht 6' 0.01 (1.829 m)   Wt 208 lb 9.6 oz (94.6 kg)   SpO2 98%   BMI 28.28 kg/m   Wt Readings from Last 3 Encounters:  01/12/25 208 lb 9.6 oz (94.6 kg)  12/12/24 206 lb (93.4 kg)  10/02/24 200 lb (90.7 kg)    Physical Exam Vitals and nursing note reviewed.  Constitutional:      General: He is awake. He is not in acute distress.    Appearance: He is well-developed and well-groomed. He is not ill-appearing or toxic-appearing.  HENT:     Head: Normocephalic.     Right Ear: Hearing and external ear normal.     Left Ear: Hearing and external ear normal.  Eyes:     General: Lids are normal.     Extraocular Movements: Extraocular movements intact.     Conjunctiva/sclera: Conjunctivae normal.  Neck:     Thyroid : No thyromegaly.     Vascular: No carotid bruit.  Cardiovascular:     Rate and Rhythm: Normal rate and regular rhythm.     Heart sounds: Normal heart sounds. No murmur heard.    No gallop.  Pulmonary:     Effort: No accessory muscle usage or respiratory distress.     Breath sounds: Normal breath sounds. No  decreased breath sounds, wheezing or rales.  Abdominal:     General: Bowel sounds are normal. There is no distension.     Palpations: Abdomen is soft.     Tenderness: There is no abdominal tenderness.  Musculoskeletal:     Cervical back: Full passive range of motion without pain.     Right knee: No swelling or erythema. Decreased range of motion (slight reduction with bending of knee). No tenderness.     Left knee: No swelling or erythema. Normal range of motion. No tenderness.     Right lower leg: No edema.     Left lower leg: No edema.  Lymphadenopathy:     Cervical: No cervical adenopathy.  Skin:    General: Skin is  warm.     Capillary Refill: Capillary refill takes less than 2 seconds.  Neurological:     Mental Status: He is alert and oriented to person, place, and time.     Deep Tendon Reflexes: Reflexes are normal and symmetric.     Reflex Scores:      Brachioradialis reflexes are 2+ on the right side and 2+ on the left side.      Patellar reflexes are 2+ on the right side and 2+ on the left side. Psychiatric:        Attention and Perception: Attention normal.        Mood and Affect: Mood normal.        Speech: Speech normal.        Behavior: Behavior normal. Behavior is cooperative.        Thought Content: Thought content normal.     Results for orders placed or performed in visit on 12/12/24  CBC with Differential/Platelet   Collection Time: 12/12/24  8:28 AM  Result Value Ref Range   WBC 4.9 3.4 - 10.8 x10E3/uL   RBC 4.05 (L) 4.14 - 5.80 x10E6/uL   Hemoglobin 14.2 13.0 - 17.7 g/dL   Hematocrit 57.3 62.4 - 51.0 %   MCV 105 (H) 79 - 97 fL   MCH 35.1 (H) 26.6 - 33.0 pg   MCHC 33.3 31.5 - 35.7 g/dL   RDW 86.2 88.3 - 84.5 %   Platelets 325 150 - 450 x10E3/uL   Neutrophils 31 Not Estab. %   Lymphs 47 Not Estab. %   Monocytes 14 Not Estab. %   Eos 6 Not Estab. %   Basos 1 Not Estab. %   Neutrophils Absolute 1.5 1.4 - 7.0 x10E3/uL   Lymphocytes Absolute 2.3 0.7 - 3.1 x10E3/uL   Monocytes Absolute 0.7 0.1 - 0.9 x10E3/uL   EOS (ABSOLUTE) 0.3 0.0 - 0.4 x10E3/uL   Basophils Absolute 0.0 0.0 - 0.2 x10E3/uL   Immature Granulocytes 0 Not Estab. %   Immature Grans (Abs) 0.0 0.0 - 0.1 x10E3/uL  Comprehensive metabolic panel with GFR   Collection Time: 12/12/24  8:28 AM  Result Value Ref Range   Glucose 106 (H) 70 - 99 mg/dL   BUN 17 8 - 27 mg/dL   Creatinine, Ser 8.99 0.76 - 1.27 mg/dL   eGFR 80 >40 fO/fpw/8.26   BUN/Creatinine Ratio 17 10 - 24   Sodium 142 134 - 144 mmol/L   Potassium 4.3 3.5 - 5.2 mmol/L   Chloride 105 96 - 106 mmol/L   CO2 26 20 - 29 mmol/L   Calcium  9.6 8.6 - 10.2  mg/dL   Total Protein 6.9 6.0 - 8.5 g/dL   Albumin 4.5 3.8 - 4.8 g/dL  Globulin, Total 2.4 1.5 - 4.5 g/dL   Bilirubin Total 0.4 0.0 - 1.2 mg/dL   Alkaline Phosphatase 81 47 - 123 IU/L   AST 24 0 - 40 IU/L   ALT 21 0 - 44 IU/L  Lipid Panel w/o Chol/HDL Ratio   Collection Time: 12/12/24  8:28 AM  Result Value Ref Range   Cholesterol, Total 141 100 - 199 mg/dL   Triglycerides 87 0 - 149 mg/dL   HDL 44 >60 mg/dL   VLDL Cholesterol Cal 17 5 - 40 mg/dL   LDL Chol Calc (NIH) 80 0 - 99 mg/dL  TSH   Collection Time: 12/12/24  8:28 AM  Result Value Ref Range   TSH 1.320 0.450 - 4.500 uIU/mL  PSA   Collection Time: 12/12/24  8:28 AM  Result Value Ref Range   Prostate Specific Ag, Serum 0.8 0.0 - 4.0 ng/mL      Assessment & Plan:   Problem List Items Addressed This Visit       Other   Right knee pain   Overall improving, some reduction of ROM still with bending of knee. Will get imaging of knee to further assess and have baseline, ?some arthritic changes. Recommend he continue at home care and exercises. If worsening could consider knee steroid injection.      Relevant Orders   DG Knee Complete 4 Views Right   Other Visit Diagnoses       Colon cancer screening    -  Primary   Relevant Orders   Ambulatory referral to Gastroenterology        Follow up plan: Return for as scheduled December 21st.      "

## 2025-01-17 ENCOUNTER — Ambulatory Visit: Payer: Self-pay | Admitting: Nurse Practitioner

## 2025-01-17 NOTE — Progress Notes (Signed)
 Contacted via MyChart  Good afternoon Christopher Santos, your knee imaging has returned and is showing some mild narrowing, arthritic changes. There is just a trace of fluid around front of knee area. Overall stable findings that we can continue to monitor and if worsening pain could consider steroid injection or referral to physical therapy. Any questions? Stay safe and warm. Keep being amazing!!  Thank you for allowing me to participate in your care.  I appreciate you. Kindest regards, Jamilyn Pigeon

## 2025-01-22 ENCOUNTER — Telehealth: Payer: Self-pay

## 2025-01-22 ENCOUNTER — Other Ambulatory Visit: Payer: Self-pay

## 2025-01-22 DIAGNOSIS — Z860100 Personal history of colon polyps, unspecified: Secondary | ICD-10-CM

## 2025-01-22 MED ORDER — PEG 3350-KCL-NA BICARB-NACL 420 G PO SOLR: 4000.0000 mL | Freq: Once | ORAL | 0 refills | Status: AC

## 2025-01-22 NOTE — Telephone Encounter (Signed)
 Gastroenterology Pre-Procedure Review  Request Date: 04/08/25 Requesting Physician: Dr. Theophilus  PATIENT REVIEW QUESTIONS: The patient responded to the following health history questions as indicated:    1. Are you having any GI issues? no 2. Do you have a personal history of Polyps? yes (last colonoscopy performed 03/31/20 with Dr Abran recommended repeat in 5 years) 3. Do you have a family history of Colon Cancer or Polyps? no 4. Diabetes Mellitus? no 5. Joint replacements in the past 12 months?no 6. Major health problems in the past 3 months?no 7. Any artificial heart valves, MVP, or defibrillator?no    MEDICATIONS & ALLERGIES:    Patient reports the following regarding taking any anticoagulation/antiplatelet therapy:   Plavix, Coumadin, Eliquis, Xarelto, Lovenox , Pradaxa, Brilinta, or Effient? no Aspirin ? yes (81 mg daily)  Patient confirms/reports the following medications:  Current Outpatient Medications  Medication Sig Dispense Refill   Ascorbic Acid  (VITAMIN C) 1000 MG tablet Take 1,000 mg by mouth daily.     aspirin  81 MG tablet Take 81 mg by mouth at bedtime.      b complex vitamins capsule Take 1 capsule by mouth daily.     cyanocobalamin  (VITAMIN B12) 500 MCG tablet Take 500 mcg by mouth daily.     ezetimibe  (ZETIA ) 10 MG tablet TAKE 1 TABLET BY MOUTH EVERY DAY 90 tablet 3   L-Lysine 500 MG CAPS Take by mouth.     magnesium oxide (MAG-OX) 400 (240 Mg) MG tablet Take 400 mg by mouth daily.     Omega-3 Fatty Acids (OMEGA 3 500 PO) Take by mouth.     pravastatin  (PRAVACHOL ) 80 MG tablet Take 1 tablet (80 mg total) by mouth daily. 90 tablet 4   vitamin E 200 UNIT capsule Take 200 Units by mouth daily.     No current facility-administered medications for this visit.    Patient confirms/reports the following allergies:  Allergies[1]  No orders of the defined types were placed in this encounter.   AUTHORIZATION INFORMATION Primary Insurance: 1D#: Group  #:  Secondary Insurance: 1D#: Group #:  SCHEDULE INFORMATION: Date: 04/08/25 Time: Location: [facility]    [1] No Known Allergies

## 2025-01-22 NOTE — Telephone Encounter (Signed)
 To schedule procedure

## 2025-01-22 NOTE — Telephone Encounter (Signed)
 Pt returned your call.

## 2025-04-08 ENCOUNTER — Ambulatory Visit: Admit: 2025-04-08

## 2025-10-08 ENCOUNTER — Ambulatory Visit

## 2025-12-14 ENCOUNTER — Encounter: Admitting: Nurse Practitioner
